# Patient Record
Sex: Male | Born: 1960
Health system: Southern US, Community
[De-identification: ages and names within clinical notes are randomized; demographics above are authoritative.]

## PROBLEM LIST (undated history)

## (undated) DIAGNOSIS — I10 Essential (primary) hypertension: Secondary | ICD-10-CM

## (undated) DIAGNOSIS — E785 Hyperlipidemia, unspecified: Secondary | ICD-10-CM

## (undated) DIAGNOSIS — I429 Cardiomyopathy, unspecified: Secondary | ICD-10-CM

## (undated) DIAGNOSIS — I4891 Unspecified atrial fibrillation: Secondary | ICD-10-CM

## (undated) DIAGNOSIS — R002 Palpitations: Secondary | ICD-10-CM

## (undated) DIAGNOSIS — R0600 Dyspnea, unspecified: Secondary | ICD-10-CM

## (undated) HISTORY — DX: Hyperlipidemia, unspecified: E78.5

## (undated) HISTORY — DX: Palpitations: R00.2

## (undated) HISTORY — PX: EYE SURGERY: SHX253

---

## 2005-04-16 ENCOUNTER — Emergency Department (HOSPITAL_COMMUNITY): Admission: EM | Admit: 2005-04-16 | Discharge: 2005-04-16 | Payer: Self-pay | Admitting: Emergency Medicine

## 2009-01-25 ENCOUNTER — Emergency Department (HOSPITAL_COMMUNITY): Admission: EM | Admit: 2009-01-25 | Discharge: 2009-01-25 | Payer: Self-pay | Admitting: Emergency Medicine

## 2010-07-02 LAB — POCT I-STAT, CHEM 8
BUN: 13 mg/dL (ref 6–23)
Calcium, Ion: 1.03 mmol/L — ABNORMAL LOW (ref 1.12–1.32)
Chloride: 104 mEq/L (ref 96–112)
Creatinine, Ser: 1.4 mg/dL (ref 0.4–1.5)
Glucose, Bld: 123 mg/dL — ABNORMAL HIGH (ref 70–99)
HCT: 44 % (ref 39.0–52.0)
Hemoglobin: 15 g/dL (ref 13.0–17.0)
Potassium: 3.8 mEq/L (ref 3.5–5.1)
Sodium: 140 mEq/L (ref 135–145)
TCO2: 26 mmol/L (ref 0–100)

## 2013-06-17 ENCOUNTER — Emergency Department (HOSPITAL_COMMUNITY)
Admission: EM | Admit: 2013-06-17 | Discharge: 2013-06-17 | Disposition: A | Discharge status: Home / Self Care | Payer: BC Managed Care – PPO | Attending: Emergency Medicine | Admitting: Emergency Medicine

## 2013-06-17 ENCOUNTER — Encounter (HOSPITAL_COMMUNITY): Payer: Self-pay | Admitting: Emergency Medicine

## 2013-06-17 DIAGNOSIS — K0889 Other specified disorders of teeth and supporting structures: Secondary | ICD-10-CM

## 2013-06-17 DIAGNOSIS — K029 Dental caries, unspecified: Secondary | ICD-10-CM | POA: Insufficient documentation

## 2013-06-17 DIAGNOSIS — K089 Disorder of teeth and supporting structures, unspecified: Secondary | ICD-10-CM | POA: Insufficient documentation

## 2013-06-17 MED ORDER — OXYCODONE-ACETAMINOPHEN 5-325 MG PO TABS
2.0000 | ORAL_TABLET | Freq: Four times a day (QID) | ORAL | Status: DC | PRN
Start: 2013-06-17 — End: 2014-07-30

## 2013-06-17 MED ORDER — ONDANSETRON 4 MG PO TBDP
8.0000 mg | ORAL_TABLET | Freq: Once | ORAL | Status: AC
  Administered 2013-06-17: 8 mg via ORAL
  Filled 2013-06-17: qty 2

## 2013-06-17 MED ORDER — OXYCODONE-ACETAMINOPHEN 5-325 MG PO TABS
2.0000 | ORAL_TABLET | Freq: Once | ORAL | Status: AC
  Administered 2013-06-17: 2 via ORAL
  Filled 2013-06-17: qty 2

## 2013-06-17 MED ORDER — PENICILLIN V POTASSIUM 500 MG PO TABS: 500.0000 mg | ORAL_TABLET | Freq: Four times a day (QID) | ORAL | Status: DC

## 2013-06-17 NOTE — ED Notes (Signed)
Toothache for one week

## 2013-06-17 NOTE — Discharge Instructions (Signed)
Dental Pain Toothache is pain in or around a tooth. It may get worse with chewing or with cold or heat.  HOME CARE  Your dentist may use a numbing medicine during treatment. If so, you may need to avoid eating until the medicine wears off. Ask your dentist about this.  Only take medicine as told by your dentist or doctor.  Avoid chewing food near the painful tooth until after all treatment is done. Ask your dentist about this. GET HELP RIGHT AWAY IF:   The problem gets worse or new problems appear.  You have a fever.  There is redness and puffiness (swelling) of the face, jaw, or neck.  You cannot open your mouth.  There is pain in the jaw.  There is very bad pain that is not helped by medicine. MAKE SURE YOU:   Understand these instructions.  Will watch your condition.  Will get help right away if you are not doing well or get worse. Document Released: 09/01/2007 Document Revised: 06/07/2011 Document Reviewed: 09/01/2007 [facility] Patient Information 2014 [place], [place].   Emergency Department Resource Guide 1) Find a Doctor and Pay Out of Pocket Although you won't have to find out who is covered by your insurance plan, it is a good idea to ask around and get recommendations. You will then need to call the office and see if the doctor you have chosen will accept you as a new patient and what types of options they offer for patients who are self-pay. Some doctors offer discounts or will set up payment plans for their patients who do not have insurance, but you will need to ask so you aren't surprised when you get to your appointment.  2) Contact Your Local Health Department Not all health departments have doctors that can see patients for sick visits, but many do, so it is worth a call to see if yours does. If you don't know where your local health department is, you can check in your phone book. The CDC also has a tool to help you locate your state's health department, and many  state websites also have listings of all of their local health departments.  3) Find a [facility] If your illness is not likely to be very severe or complicated, you may want to try a walk in clinic. These are popping up all over the country in pharmacies, drugstores, and shopping centers. They're usually staffed by nurse practitioners or physician assistants that have been trained to treat common illnesses and complaints. They're usually fairly quick and inexpensive. However, if you have serious medical issues or chronic medical problems, these are probably not your best option.  No Primary Care Doctor: - Call Health Connect at  [phone] - they can help you locate a primary care doctor that  accepts your insurance, provides certain services, etc. - Physician Referral Service- [phone]  Chronic Pain Problems: Organization         Address  Phone   Notes  [facility]  [phone] Patients need to be referred by their primary care doctor.   Medication Assistance: Organization         Address  Phone   Notes  [facility] [address] [place],  [address] [phone] --Must be a resident of [facility] -- Must have NO insurance coverage whatsoever (no Medicaid/ Medicare, etc.) -- The pt. MUST have a primary care doctor that directs their care regularly and follows  them in the community   [facility]  [phone]   [employer]  [phone]    Agencies that provide inexpensive medical care: Organization         Address  Phone   Notes  [place]  [phone]   [facility]    [phone]   [facility] [place], [place] [address] [phone]   [place] [address], [place] [phone]   Planned Parenthood    [phone]   [facility]    [phone]   [place] and  [address], [place] Phone:  [phone], Fax:  [phone] Hours of Operation:  9 am - 6 pm, M-F.  Also accepts Medicaid/Medicare and self-pay.  [facility] for [address], [address], [place] Phone: [phone], Fax: [phone]. Hours of Operation:  8:30 am - 5:30 pm, M-F.  Also accepts Medicaid and self-pay.  [facility] [place] [address] [place] Phone: [phone]   [place], [place], [place] [phone], Ext. [phone] Mondays & Thursdays: 7-9 AM.  First 15 patients are seen on a first come, first serve basis.    [place] Providers:  Organization         Address  Phone   Notes  [facility] [address], [place] [phone] Also accepts self-pay patients.  [facility] [address], [place]  [phone]   [place], [address], [place] [phone]   [facility] [address], [place] [phone]   Lucianne Lei [address], [place]   [phone] Only accepts [place] [facility] [place] patients after they have their name applied to their card.   Self-Pay (no insurance) in [facility]:  Organization         Address  Phone   Notes  Sickle Cell Patients, [facility] [place] [phone]   [facility] [place] [phone]   [facility] [place]  [place], [place], [place] [phone]   [facility]/Dr. Osei-Bonsu  [address] [place] [address], [place] or [address], [place] [phone] Phone number for both Institute and [place] locations is the same.  [facility] [address], [place] [phone]   [facility] [address] [place] or [address] [phone]   [facility] [address], [place] [phone], phone; [phone], fax Sees patients 1st and 3rd Saturday of every month.  Must not qualify for public or private insurance (i.e. Medicaid, Medicare, [place] Health Choice, Veterans' Benefits)  Household income should be no more than 200% of the poverty level The clinic cannot treat you if you are pregnant or think you are pregnant  Sexually transmitted diseases are not treated at the clinic.    Dental Care: Organization         Address  Phone  Notes  [facility] [address] [place], [place] [phone] Accepts children up to age 15 who are enrolled  in Medicaid or [place] Health Choice; pregnant women with a Medicaid card; and children who have applied for Medicaid or [place] Health Choice, but were declined, whose parents can pay a reduced fee at time of service.  °[facility] [place]  [address], [place] [phone] Accepts children up to age 21 who are enrolled in Medicaid or [place] Health Choice; pregnant women with a Medicaid card; and children who have applied for Medicaid or [place] Health Choice, but were declined, whose parents can pay a reduced fee at time of service.  °[facility] ° [address], [place] [phone] Patients are seen by appointment only. Walk-ins are not accepted. [facility] will see patients 18 years of age and older. °Monday - Tuesday (8am-5pm) °Most Wednesdays (8:30-5pm) °$30 per visit, cash only  °[facility] ° [address], [place] [phone] Patients are seen by appointment only. Walk-ins are not accepted. [facility] will see patients 18 years of age and older. °One Wednesday Evening (Monthly: Volunteer Based).  $30 per visit, cash only  °[facility]  [phone] for adults; Children under age 4, call Graduate Pediatric Dentistry at [phone]. Children aged 4-14, please call [phone] to request a pediatric application. ° Dental services are provided in all areas of dental care including fillings, crowns and bridges, complete and partial dentures, implants, gum treatment, root canals, and extractions. Preventive care is also provided. Treatment is provided to both adults and children. °Patients are selected via a lottery and there is often a waiting list. °  °[facility] [address], °[place] ° ([phone] [email or web address].[email or web address] °  °[facility] [address], [place], [place] ([phone], Ext. [phone] Second and Fourth Thursday of each month, opens at 6:30 AM; Clinic ends at 9 AM.  Patients are seen on a first-come first-served basis, and a limited number are seen during each clinic.  ° °[facility] ° [address], [place], [place] [phone]   Eligibility Requirements °You must have lived in [place], [place], or [place] counties for at least the last three months. °  You cannot be eligible for state or federal sponsored healthcare insurance, including Veterans Administration, Medicaid, or Medicare. °  You generally cannot be eligible for healthcare insurance through your employer.  °  How to apply: °Eligibility screenings are held every Tuesday and Wednesday afternoon from 1:00 pm until 4:00 pm. You do not need an appointment for the interview!  °[facility] [address], [place], [place] [phone]   °[facility]  [phone]   °[facility]  [phone]   °[facility]  [phone]   ° °Behavioral Health Resources in the Community: °Intensive Outpatient Programs °Organization         Address  Phone  Notes  °[facility] [address], [place], [place]  [phone]   °[facility] [address], [place], [place] [phone]   °[facility] [address], [place], [place] ° [phone]   °[facility] [address],  °[place], [place] 1-800-853-5163 or [phone]   °Substance Abuse Resources °Organization         Address  Phone  Notes  °[facility]  336-882-2125   °[facility]  [phone]   °  [facility]   Chinita Pester  ([phone])[phone]   Residential & Outpatient Substance Abuse Program  ([phone]   Psychological Services Organization         Address  Phone  Notes  [facility] [place]  [place]  [phone]   [facility] [address] [place] or [phone]    Mobile Crisis Teams Organization         Address  Phone  Notes  [facility], Mobile Crisis Care Unit  [phone]   [facility]  [address] [place], [place]   [place] [address], [place] [phone]    Self-Help/Support Groups Organization         Address  Phone             Notes  [place] [place] - variety of support groups  [place] Call for more information  Narcotics Anonymous (NA), [facility] [address], [employer] [place]  2 meetings at this location   Special educational needs teacher         Address  Phone  Notes  [facility] [place],    [place]  [phone]   [facility]  [address], [place] [address], [place], [place]   [place], [place] [phone] Admissions: 8am-3pm M-F  Incentives Substance [place] [address],    [place], [place] [phone]   [facility] [address] [place], [place], [place]   [facility] [address],    [place], [place]   [facility] - Intensive Outpatient [address], [place], [place]   [facility] ([facility] [place].,  [place], [place] [phone] or [phone]   [facility] ([facility]) [address], [place], [place] Accepts Medicaid  [facility] [place] [address]  [place] 1-646-085-7975 Substance Abuse/Addiction Treatment   [facility] Organization         Address  Phone  Notes  [facility]  [phone]   Domenic Schwab, PhD [address] Arlis Porta [place], [place]   [phone] or [phone]   [facility] [place], [place] [phone]   [facility] [address] [place], [place] [phone] Insurance/Medicaid/sponsorship through [facility] and Families [address] [place]                                    [place], [place] [phone] [place] [address], [place] [phone]    Dr. Adele Schilder  [phone]   [facility] Dept. 1) [address] [place] 2) [place] 3)  [place] [address], [place] [phone]  [phone]   [place] [phone] or [phone] (After Hours)

## 2013-06-17 NOTE — ED Provider Notes (Signed)
Medical screening examination/treatment/procedure(s) were performed by non-physician practitioner and as supervising physician I was immediately available for consultation/collaboration.    Johnna Acosta, MD 06/17/13 [phone]

## 2013-06-17 NOTE — ED Provider Notes (Signed)
CSN: [id number]     Arrival date & time 06/17/13  0014 History   First MD Initiated Contact with Patient 06/17/13 0040     Chief Complaint  Patient presents with  . Dental Pain     (Consider location/radiation/quality/duration/timing/severity/associated sxs/prior Treatment) HPI Comments: Patient is an otherwise healthy 53 year old male who presents to the ED for dental pain. The pain has been gradually worsening over the past week, but suddenly worsened tonight. It is in his right lower molar. He reports it is a throbbing, achy pain that is sometimes sharp. It is better with cold water. He denies fevers, chills, nausea, vomiting, shortness of breath.   Patient is a 53 y.o. male presenting with tooth pain. The history is provided by the patient. No language interpreter was used.  Dental Pain Associated symptoms: no fever     History reviewed. No pertinent past medical history. History reviewed. No pertinent past surgical history. No family history on file. History  Substance Use Topics  . Smoking status: Never Smoker   . Smokeless tobacco: Not on file  . Alcohol Use: No    Review of Systems  Constitutional: Negative for fever and chills.  HENT: Positive for dental problem.   Respiratory: Negative for shortness of breath.   Cardiovascular: Negative for chest pain.  Gastrointestinal: Negative for nausea, vomiting and abdominal pain.  All other systems reviewed and are negative.      Allergies  Review of patient's allergies indicates no known allergies.  Home Medications   Current Outpatient Rx  Name  Route  Sig  Dispense  Refill  . oxyCODONE-acetaminophen (PERCOCET/ROXICET) 5-325 MG per tablet   Oral   Take 2 tablets by mouth every 6 (six) hours as needed for severe pain.   20 tablet   0   . penicillin v potassium (VEETID) 500 MG tablet   Oral   Take 1 tablet (500 mg total) by mouth 4 (four) times daily.   40 tablet   0    BP 132/88  Pulse 87  Temp(Src) 98.4  F (36.9 C) (Oral)  Resp 18  Ht 6' 3.5" (1.918 m)  Wt 226 lb (102.513 kg)  BMI 27.87 kg/m2  SpO2 98% Physical Exam  Nursing note and vitals reviewed. Constitutional: He is oriented to person, place, and time. He appears well-developed and well-nourished.  Non-toxic appearance. He does not have a sickly appearance. He does not appear ill. No distress.  HENT:  Head: Normocephalic and atraumatic.  Right Ear: External ear normal.  Left Ear: External ear normal.  Nose: Nose normal.  Mouth/Throat: Uvula is midline and oropharynx is clear and moist. No trismus in the jaw. Abnormal dentition. No uvula swelling.  Generally poor dentition with dental caries scattered through mouth. Broken tooth in back left molar. TTP over posterior right molar. No trismus, submental edema, or tongue elevation.   Eyes: Conjunctivae are normal.  Neck: Normal range of motion. No tracheal deviation present.  Cardiovascular: Normal rate, regular rhythm and normal heart sounds.   Pulmonary/Chest: Effort normal and breath sounds normal. No stridor.  Abdominal: Soft. He exhibits no distension. There is no tenderness.  Musculoskeletal: Normal range of motion.  Neurological: He is alert and oriented to person, place, and time.  Skin: Skin is warm and dry. He is not diaphoretic.  Psychiatric: He has a normal mood and affect. His behavior is normal.    ED Course  Procedures (including critical care time) Labs Review Labs Reviewed - No data  to display Imaging Review No results found.   EKG Interpretation None      MDM   Final diagnoses:  Pain, dental   Patient with toothache.  No gross abscess.  Exam unconcerning for Ludwig's angina or spread of infection.  Will treat with penicillin and pain medicine.  Urged patient to follow-up with dentist.    Elwyn Lade, [name] 06/17/13 0301

## 2014-07-06 ENCOUNTER — Encounter (HOSPITAL_COMMUNITY): Payer: Self-pay | Admitting: Family Medicine

## 2014-07-06 ENCOUNTER — Emergency Department (HOSPITAL_COMMUNITY)
Admission: EM | Admit: 2014-07-06 | Discharge: 2014-07-06 | Disposition: A | Discharge status: Home / Self Care | Payer: BLUE CROSS/BLUE SHIELD | Attending: Emergency Medicine | Admitting: Emergency Medicine

## 2014-07-06 DIAGNOSIS — M545 Low back pain, unspecified: Secondary | ICD-10-CM

## 2014-07-06 DIAGNOSIS — Z792 Long term (current) use of antibiotics: Secondary | ICD-10-CM | POA: Diagnosis not present

## 2014-07-06 MED ORDER — METHOCARBAMOL 500 MG PO TABS: 500.0000 mg | ORAL_TABLET | Freq: Two times a day (BID) | ORAL | Status: DC | PRN

## 2014-07-06 MED ORDER — NAPROXEN 500 MG PO TABS: 500.0000 mg | ORAL_TABLET | Freq: Two times a day (BID) | ORAL | Status: DC

## 2014-07-06 NOTE — ED Provider Notes (Signed)
CSN: [id number]     Arrival date & time 07/06/14  8144 History  This chart was scribed for non-physician practitioner, Will Porfirio Mylar, [name] working with No att. providers found by Judithann Sauger, ED Scribe. The patient was seen in room TR07C/TR07C and the patient's care was started at 8:29 PM    Chief Complaint  Patient presents with  . Back Pain   The history is provided by the patient. No language interpreter was used.   HPI Comments: Carlos Harrison is a 54 y.o. male who presents to the Emergency Department complaining of a constant 7/10 right back pain that radiates down the top on his right thigh onset 5 days ago after he lifted something heavy at work. He reports that he does a lot of bending at work. He denies numbness and tingling. He states that sitting makes the pain worse but once he starts moving, the pain is a little better. He also denies any fever, chills, dysuria, hematuria, loss of bladder control, or incontinence. He denies abdominal pain, N/V/D, lightheadedness, dizziness, sore throat or congestion. He reports taking 4 Tylenol this am and 3 Advil yesterday with no relief. He reports that this back pain is different from any of his previous back symptoms as this time it is radiating. He denies a hx of cancer or IV drug use. He denies a hx of abdominal or kidney problems.    History reviewed. No pertinent past medical history. History reviewed. No pertinent past surgical history. History reviewed. No pertinent family history. History  Substance Use Topics  . Smoking status: Never Smoker   . Smokeless tobacco: Not on file  . Alcohol Use: No    Review of Systems  Constitutional: Negative for fever and chills.  HENT: Negative for congestion and sore throat.   Respiratory: Negative for shortness of breath.   Cardiovascular: Negative for leg swelling.  Gastrointestinal: Negative for nausea, vomiting, abdominal pain and diarrhea.  Genitourinary: Negative for dysuria, urgency,  hematuria, flank pain, decreased urine volume, penile swelling, scrotal swelling, difficulty urinating, penile pain and testicular pain.  Musculoskeletal: Positive for back pain. Negative for myalgias, joint swelling, gait problem and neck pain.  Skin: Negative for rash.  Neurological: Negative for dizziness, syncope, weakness, light-headedness, numbness and headaches.      Allergies  Review of patient's allergies indicates no known allergies.  Home Medications   Prior to Admission medications   Medication Sig Start Date End Date Taking? Authorizing Provider  methocarbamol (ROBAXIN) 500 MG tablet Take 1 tablet (500 mg total) by mouth 2 (two) times daily as needed for muscle spasms. 07/06/14   Waynetta Pean, [name]  naproxen (NAPROSYN) 500 MG tablet Take 1 tablet (500 mg total) by mouth 2 (two) times daily with a meal. 07/06/14   Waynetta Pean, [name]  oxyCODONE-acetaminophen (PERCOCET/ROXICET) 5-325 MG per tablet Take 2 tablets by mouth every 6 (six) hours as needed for severe pain. 06/17/13   Cleatrice Burke, [name]  penicillin v potassium (VEETID) 500 MG tablet Take 1 tablet (500 mg total) by mouth 4 (four) times daily. 06/17/13   Cleatrice Burke, [name]   BP 156/94 mmHg  Pulse 71  Temp(Src) 98.6 F (37 C) (Oral)  Resp 16  SpO2 100% Physical Exam  Constitutional: He is oriented to person, place, and time. He appears well-developed and well-nourished. No distress.  Nontoxic appearing.  HENT:  Head: Normocephalic and atraumatic.  Right Ear: External ear normal.  Left Ear: External ear normal.  Mouth/Throat: No oropharyngeal exudate.  Eyes: Conjunctivae and EOM are normal. Pupils are equal, round, and reactive to light. Right eye exhibits no discharge. Left eye exhibits no discharge.  Neck: Neck supple. No tracheal deviation present.  Cardiovascular: Normal rate, regular rhythm, normal heart sounds and intact distal pulses.   Bilateral radial and PT pulses intact.  Pulmonary/Chest: Effort  normal and breath sounds normal. No respiratory distress. He has no wheezes. He has no rales.  Abdominal: Soft. There is no tenderness.  Musculoskeletal: Normal range of motion. He exhibits tenderness. He exhibits no edema.  Mild right lateral low back tenderness. No midline back tenderness, no edema, no deformities, no crepitus, no step-off, no erythema.  No lower extremity edema or tenderness.  Patient is spontaneously moving all extremities in a coordinated fashion exhibiting good strength. The patient is able to ambulate without difficulty or assistance.    Lymphadenopathy:    He has no cervical adenopathy.  Neurological: He is alert and oriented to person, place, and time. He has normal reflexes. He displays normal reflexes. Coordination normal.  Bilateral patellar DTRs intact. Sensation is intact in his bilateral upper and lower extremities.  Skin: Skin is warm and dry. No rash noted. He is not diaphoretic. No erythema. No pallor.  Psychiatric: He has a normal mood and affect. His behavior is normal.  Nursing note and vitals reviewed.   ED Course  Procedures (including critical care time) DIAGNOSTIC STUDIES: Oxygen Saturation is 100% on RA, normal by my interpretation.    COORDINATION OF CARE: 8:45 PM- Pt advised of plan for treatment and pt agrees.    Labs Review Labs Reviewed - No data to display  Imaging Review No results found.   EKG Interpretation None      Filed Vitals:   07/06/14 1826 07/06/14 2050  BP: 147/84 156/94  Pulse: 66 71  Temp: 98.6 F (37 C)   TempSrc: Oral Oral  Resp: 15 16  SpO2: 100% 100%     MDM   Meds given in ED:  Medications - No data to display  Discharge Medication List as of 07/06/2014  8:47 PM    START taking these medications   Details  methocarbamol (ROBAXIN) 500 MG tablet Take 1 tablet (500 mg total) by mouth 2 (two) times daily as needed for muscle spasms., Starting 07/06/2014, Until Discontinued, Print    naproxen  (NAPROSYN) 500 MG tablet Take 1 tablet (500 mg total) by mouth 2 (two) times daily with a meal., Starting 07/06/2014, Until Discontinued, Print        Final diagnoses:  Right-sided low back pain without sciatica   This is a 54 year old male who is otherwise healthy presents to the emergency department complaining of right lower back pain radiates down his anterior thigh after lifting a heavy object at work 5 days ago. He reports his pain is worse with movement and sitting for long periods. He also reports that once he gets up and starts walking his pain improves somewhat. He currently rates his pain at 7 out of 10. Denies any numbness, tingling or weakness.  No neurological deficits and normal neuro exam.  Patient can walk but states is painful.  No loss of bowel or bladder control.  No concern for cauda equina.  No fever, night sweats, weight loss, h/o cancer, IVDU.  RICE protocol and pain medicine indicated and discussed with patient. I advised the patient to follow-up with their primary care provider this week. I advised the patient to return to the emergency department with  new or worsening symptoms or new concerns. The patient verbalized understanding and agreement with plan.   I personally performed the services described in this documentation, which was scribed in my presence. The recorded information has been reviewed and is accurate.    Waynetta Pean, [name] 07/06/14 2058  Pamella Pert, MD 07/07/14 [phone]

## 2014-07-06 NOTE — Discharge Instructions (Signed)
Back Pain, Adult °Low back pain is very common. About 1 in 5 people have back pain. The cause of low back pain is rarely dangerous. The pain often gets better over time. About half of people with a sudden onset of back pain feel better in just 2 weeks. About 8 in 10 people feel better by 6 weeks.  °CAUSES °Some common causes of back pain include: °· Strain of the muscles or ligaments supporting the spine. °· Wear and tear (degeneration) of the spinal discs. °· Arthritis. °· Direct injury to the back. °DIAGNOSIS °Most of the time, the direct cause of low back pain is not known. However, back pain can be treated effectively even when the exact cause of the pain is unknown. Answering your caregiver's questions about your overall health and symptoms is one of the most accurate ways to make sure the cause of your pain is not dangerous. If your caregiver needs more information, he or she may order lab work or imaging tests (X-rays or MRIs). However, even if imaging tests show changes in your back, this usually does not require surgery. °HOME CARE INSTRUCTIONS °For many people, back pain returns. Since low back pain is rarely dangerous, it is often a condition that people can learn to manage on their own.  °· Remain active. It is stressful on the back to sit or stand in one place. Do not sit, drive, or stand in one place for more than 30 minutes at a time. Take short walks on level surfaces as soon as pain allows. Try to increase the length of time you walk each day. °· Do not stay in bed. Resting more than 1 or 2 days can delay your recovery. °· Do not avoid exercise or work. Your body is made to move. It is not dangerous to be active, even though your back may hurt. Your back will likely heal faster if you return to being active before your pain is gone. °· Pay attention to your body when you  bend and lift. Many people have less discomfort when lifting if they bend their knees, keep the load close to their bodies, and  avoid twisting. Often, the most comfortable positions are those that put less stress on your recovering back. °· Find a comfortable position to sleep. Use a firm mattress and lie on your side with your knees slightly bent. If you lie on your back, put a pillow under your knees. °· Only take over-the-counter or prescription medicines as directed by your caregiver. Over-the-counter medicines to reduce pain and inflammation are often the most helpful. Your caregiver may prescribe muscle relaxant drugs. These medicines help dull your pain so you can more quickly return to your normal activities and healthy exercise. °· Put ice on the injured area. °· Put ice in a plastic bag. °· Place a towel between your skin and the bag. °· Leave the ice on for 15-20 minutes, 03-04 times a day for the first 2 to 3 days. After that, ice and heat may be alternated to reduce pain and spasms. °· Ask your caregiver about trying back exercises and gentle massage. This may be of some benefit. °· Avoid feeling anxious or stressed. Stress increases muscle tension and can worsen back pain. It is important to recognize when you are anxious or stressed and learn ways to manage it. Exercise is a great option. °SEEK MEDICAL CARE IF: °· You have pain that is not relieved with rest or medicine. °· You have pain that does not improve in 1 week. °· You have new symptoms. °· You are generally not feeling well. °SEEK   IMMEDIATE MEDICAL CARE IF:   You have pain that radiates from your back into your legs.  You develop new bowel or bladder control problems.  You have unusual weakness or numbness in your arms or legs.  You develop nausea or vomiting.  You develop abdominal pain.  You feel faint. Document Released: 03/15/2005 Document Revised: 09/14/2011 Document Reviewed: 07/17/2013 [facility] Patient Information 2015 [place], [place]. This information is not intended to replace advice given to you by your health care provider. Make sure you  discuss any questions you have with your health care provider.  Back Exercises Back exercises help treat and prevent back injuries. The goal of back exercises is to increase the strength of your abdominal and back muscles and the flexibility of your back. These exercises should be started when you no longer have back pain. Back exercises include:  Pelvic Tilt. Lie on your back with your knees bent. Tilt your pelvis until the lower part of your back is against the floor. Hold this position 5 to 10 sec and repeat 5 to 10 times.  Knee to Chest. Pull first 1 knee up against your chest and hold for 20 to 30 seconds, repeat this with the other knee, and then both knees. This may be done with the other leg straight or bent, whichever feels better.  Sit-Ups or Curl-Ups. Bend your knees 90 degrees. Start with tilting your pelvis, and do a partial, slow sit-up, lifting your trunk only 30 to 45 degrees off the floor. Take at least 2 to 3 seconds for each sit-up. Do not do sit-ups with your knees out straight. If partial sit-ups are difficult, simply do the above but with only tightening your abdominal muscles and holding it as directed.  Hip-Lift. Lie on your back with your knees flexed 90 degrees. Push down with your feet and shoulders as you raise your hips a couple inches off the floor; hold for 10 seconds, repeat 5 to 10 times.  Back arches. Lie on your stomach, propping yourself up on bent elbows. Slowly press on your hands, causing an arch in your low back. Repeat 3 to 5 times. Any initial stiffness and discomfort should lessen with repetition over time.  Shoulder-Lifts. Lie face down with arms beside your body. Keep hips and torso pressed to floor as you slowly lift your head and shoulders off the floor. Do not overdo your exercises, especially in the beginning. Exercises may cause you some mild back discomfort which lasts for a few minutes; however, if the pain is more severe, or lasts for more than 15  minutes, do not continue exercises until you see your caregiver. Improvement with exercise therapy for back problems is slow.  See your caregivers for assistance with developing a proper back exercise program. Document Released: 04/22/2004 Document Revised: 06/07/2011 Document Reviewed: 01/14/2011 [facility] Patient Information 2015 [place], [place]. This information is not intended to replace advice given to you by your health care provider. Make sure you discuss any questions you have with your health care provider.  Back Injury Prevention Back injuries can be extremely painful and difficult to heal. After having one back injury, you are much more likely to experience another later on. It is important to learn how to avoid injuring or re-injuring your back. The following tips can help you to prevent a back injury. PHYSICAL FITNESS  Exercise regularly and try to develop good tone in your abdominal muscles. Your abdominal muscles provide a lot of the support needed by your back.  Do aerobic exercises (walking, jogging, biking, swimming) regularly.  Do exercises that increase balance and strength (tai chi, yoga) regularly. This can decrease your risk of falling and injuring your back.  Stretch before and after exercising.  Maintain a healthy weight. The more you weigh, the more stress is placed on your back. For every pound of weight, 10 times that amount of pressure is placed on the back. DIET  Talk to your caregiver about how much calcium and vitamin D you need per day. These nutrients help to prevent weakening of the bones (osteoporosis). Osteoporosis can cause broken (fractured) bones that lead to back pain.  Include good sources of calcium in your diet, such as dairy products, green, leafy vegetables, and products with calcium added (fortified).  Include good sources of vitamin D in your diet, such as milk and foods that are fortified with vitamin D.  Consider taking a nutritional  supplement or a multivitamin if needed.  Stop smoking if you smoke. POSTURE  Sit and stand up straight. Avoid leaning forward when you sit or hunching over when you stand.  Choose chairs with good low back (lumbar) support.  If you work at a desk, sit close to your work so you do not need to lean over. Keep your chin tucked in. Keep your neck drawn back and elbows bent at a right angle. Your arms should look like the letter "L."  Sit high and close to the steering wheel when you drive. Add a lumbar support to your car seat if needed.  Avoid sitting or standing in one position for too long. Take breaks to get up, stretch, and walk around at least once every hour. Take breaks if you are driving for long periods of time.  Sleep on your side with your knees slightly bent, or sleep on your back with a pillow under your knees. Do not sleep on your stomach. LIFTING, TWISTING, AND REACHING  Avoid heavy lifting, especially repetitive lifting. If you must do heavy lifting:  Stretch before lifting.  Work slowly.  Rest between lifts.  Use carts and dollies to move objects when possible.  Make several small trips instead of carrying 1 heavy load.  Ask for help when you need it.  Ask for help when moving big, awkward objects.  Follow these steps when lifting:  Stand with your feet shoulder-width apart.  Get as close to the object as you can. Do not try to pick up heavy objects that are far from your body.  Use handles or lifting straps if they are available.  Bend at your knees. Squat down, but keep your heels off the floor.  Keep your shoulders pulled back, your chin tucked in, and your back straight.  Lift the object slowly, tightening the muscles in your legs, abdomen, and buttocks. Keep the object as close to the center of your body as possible.  When you put a load down, use these same guidelines in reverse.  Do not:  Lift the object above your waist.  Twist at the waist  while lifting or carrying a load. Move your feet if you need to turn, not your waist.  Bend over without bending at your knees.  Avoid reaching over your head, across a table, or for an object on a high surface. OTHER TIPS  Avoid wet floors and keep sidewalks clear of ice to prevent falls.  Do not sleep on a mattress that is too soft or too hard.  Keep items that are  used frequently within easy reach.  Put heavier objects on shelves at waist level and lighter objects on lower or higher shelves.  Find ways to decrease your stress, such as exercise, massage, or relaxation techniques. Stress can build up in your muscles. Tense muscles are more vulnerable to injury.  Seek treatment for depression or anxiety if needed. These conditions can increase your risk of developing back pain. SEEK MEDICAL CARE IF:  You injure your back.  You have questions about diet, exercise, or other ways to prevent back injuries. MAKE SURE YOU:  Understand these instructions.  Will watch your condition.  Will get help right away if you are not doing well or get worse. Document Released: 04/22/2004 Document Revised: 06/07/2011 Document Reviewed: 04/26/2011 [facility] Patient Information 2015 [place], [place]. This information is not intended to replace advice given to you by your health care provider. Make sure you discuss any questions you have with your health care provider.

## 2014-07-06 NOTE — ED Notes (Signed)
Pt having right lower back pain radiating down right leg. sts started after pushing something at work.

## 2014-07-10 ENCOUNTER — Emergency Department (HOSPITAL_COMMUNITY)
Admission: EM | Admit: 2014-07-10 | Discharge: 2014-07-10 | Disposition: A | Discharge status: Home / Self Care | Payer: BLUE CROSS/BLUE SHIELD | Attending: Emergency Medicine | Admitting: Emergency Medicine

## 2014-07-10 ENCOUNTER — Encounter (HOSPITAL_COMMUNITY): Payer: Self-pay | Admitting: Physical Medicine and Rehabilitation

## 2014-07-10 DIAGNOSIS — M5417 Radiculopathy, lumbosacral region: Secondary | ICD-10-CM | POA: Diagnosis not present

## 2014-07-10 DIAGNOSIS — Z792 Long term (current) use of antibiotics: Secondary | ICD-10-CM | POA: Diagnosis not present

## 2014-07-10 DIAGNOSIS — M79604 Pain in right leg: Secondary | ICD-10-CM | POA: Diagnosis present

## 2014-07-10 MED ORDER — HYDROCODONE-ACETAMINOPHEN 5-325 MG PO TABS: 1.0000 | ORAL_TABLET | Freq: Four times a day (QID) | ORAL | Status: DC | PRN

## 2014-07-10 MED ORDER — HYDROCODONE-ACETAMINOPHEN 5-325 MG PO TABS
1.0000 | ORAL_TABLET | Freq: Once | ORAL | Status: AC
Start: 2014-07-10 — End: 2014-07-10
  Administered 2014-07-10: 1 via ORAL
  Filled 2014-07-10: qty 1

## 2014-07-10 MED ORDER — PREDNISONE 10 MG PO TABS: ORAL_TABLET | ORAL | Status: DC

## 2014-07-10 MED ORDER — PREDNISONE 20 MG PO TABS
60.0000 mg | ORAL_TABLET | Freq: Once | ORAL | Status: AC
  Administered 2014-07-10: 60 mg via ORAL
  Filled 2014-07-10: qty 3

## 2014-07-10 NOTE — ED Notes (Signed)
Pt is in stable condition upon d/c and ambulates from ED. 

## 2014-07-10 NOTE — ED Provider Notes (Signed)
CSN: [id number]     Arrival date & time 07/10/14  1700 History   This chart was scribed for non-physician practitioner, Renold Genta, [name], working with Lajean Saver, MD, by Stephania Fragmin, ED Scribe. This patient was seen in room TR06C/TR06C and the patient's care was started at 5:17 PM.    Chief Complaint  Patient presents with  . Leg Pain  . Hip Pain   The history is provided by the patient. No language interpreter was used.    HPI Comments: Carlos Harrison is a 54 y.o. male who presents to the Emergency Department complaining of pain from the top of his right buttock radiating into his right thigh after lifting something heavy at work, with onset about 1-1.5 weeks ago. He was seen here at the ED for the same 4 days ago, but the medication he was given (Naprosyn and Robaxin, per medical records) hasn't alleviated the pain. Elevating the leg alleviates that pain, but he is otherwise unable to find a comfortable position to sleep in at night.  Patient is able to ambulate for about 1 minute at a time, due to the pain. Patient has a follow-up doctor's appointment in May; he states he does not have a PCP. He denies a history of DM. He denies bowel or bladder incontinence or fever.  History reviewed. No pertinent past medical history. History reviewed. No pertinent past surgical history. History reviewed. No pertinent family history. History  Substance Use Topics  . Smoking status: Never Smoker   . Smokeless tobacco: Not on file  . Alcohol Use: No    Review of Systems  Constitutional: Negative for fever.  Genitourinary:       Negative for incontinence  Musculoskeletal: Positive for myalgias and back pain.  Neurological: Positive for numbness. Negative for weakness.      Allergies  Review of patient's allergies indicates no known allergies.  Home Medications   Prior to Admission medications   Medication Sig Start Date End Date Taking? Authorizing Provider  methocarbamol  (ROBAXIN) 500 MG tablet Take 1 tablet (500 mg total) by mouth 2 (two) times daily as needed for muscle spasms. 07/06/14   Waynetta Pean, [name]  naproxen (NAPROSYN) 500 MG tablet Take 1 tablet (500 mg total) by mouth 2 (two) times daily with a meal. 07/06/14   Waynetta Pean, [name]  oxyCODONE-acetaminophen (PERCOCET/ROXICET) 5-325 MG per tablet Take 2 tablets by mouth every 6 (six) hours as needed for severe pain. 06/17/13   Cleatrice Burke, [name]  penicillin v potassium (VEETID) 500 MG tablet Take 1 tablet (500 mg total) by mouth 4 (four) times daily. 06/17/13   Cleatrice Burke, [name]   BP 177/94 mmHg  Pulse 90  Temp(Src) 98.6 F (37 C) (Oral)  Resp 18  SpO2 100% Physical Exam  Constitutional: He is oriented to person, place, and time. He appears well-developed and well-nourished. No distress.  HENT:  Head: Normocephalic and atraumatic.  Eyes: Conjunctivae and EOM are normal.  Neck: Neck supple. No tracheal deviation present.  Cardiovascular: Normal rate.   Pulmonary/Chest: Effort normal. No respiratory distress.  Musculoskeletal: Normal range of motion.  No midline lumbar spine tenderness. Tender to palpation over right SI joint of right buttock. Mild pain with right straight leg raise. No pain with right hip internal or external rotation.  Neurological: He is alert and oriented to person, place, and time.  Mildly decreased sensation over her right medial thigh, otherwise normal. 5/5 and equal lower extremity strength. 2+ and equal patellar  reflexes bilaterally. Pt able to dorsiflex bilateral toes and feet with good strength against resistance. Equal sensation bilaterally over thighs and lower legs.   Skin: Skin is warm and dry.  Psychiatric: He has a normal mood and affect. His behavior is normal.  Nursing note and vitals reviewed.   ED Course  Procedures (including critical care time)  DIAGNOSTIC STUDIES: Oxygen Saturation is 100% on room air, normal by my interpretation.    COORDINATION  OF CARE: 5:22 PM - Discussed treatment plan with pt at bedside which includes Rx steroid medication, HEP, and referral to [facility] and pt agreed to plan.    MDM   Final diagnoses:  Lumbosacral radiculopathy    Patient with persistent lower back pain with radiation down right leg. No evidence of cauda equina, he denies any neurological symptoms other than some decrease in sensation in her right medial thigh. I do not think any imaging is indicated at this time, patient will be discharged home, will try a steroid pack, Norco for severe pain, follow-up with primary care doctor. Also have given him exercises for the back. Return precautions discussed  Filed Vitals:   07/10/14 1713  BP: 177/94  Pulse: 90  Temp: 98.6 F (37 C)  TempSrc: Oral  Resp: 18  SpO2: 100%   I personally performed the services described in this documentation, which was scribed in my presence. The recorded information has been reviewed and is accurate.    Jeannett Senior, [name] 07/10/14 1931  Lajean Saver, MD 07/10/14 2022

## 2014-07-10 NOTE — Discharge Instructions (Signed)
Continue naprosyn and robaxin. Take norco for severe pain only as prescribed. You can take 1-2 tabs every 4-6 hrs. take prednisone as prescribed until all gone, next dose tomorrow since she received one emergency department. Please follow-up with her primary care doctor of your choice for further treatment. Back exercises as described below.  Lumbosacral Radiculopathy Lumbosacral radiculopathy is a pinched nerve or nerves in the low back (lumbosacral area). When this happens you may have weakness in your legs and may not be able to stand on your toes. You may have pain going down into your legs. There may be difficulties with walking normally. There are many causes of this problem. Sometimes this may happen from an injury, or simply from arthritis or boney problems. It may also be caused by other illnesses such as diabetes. If there is no improvement after treatment, further studies may be done to find the exact cause. DIAGNOSIS  X-rays may be needed if the problems become long standing. Electromyograms may be done. This study is one in which the working of nerves and muscles is studied. HOME CARE INSTRUCTIONS   Applications of ice packs may be helpful. Ice can be used in a plastic bag with a towel around it to prevent frostbite to skin. This may be used every 2 hours for 20 to 30 minutes, or as needed, while awake, or as directed by your caregiver.  Only take over-the-counter or prescription medicines for pain, discomfort, or fever as directed by your caregiver.  If physical therapy was prescribed, follow your caregiver's directions. SEEK IMMEDIATE MEDICAL CARE IF:   You have pain not controlled with medications.  You seem to be getting worse rather than better.  You develop increasing weakness in your legs.  You develop loss of bowel or bladder control.  You have difficulty with walking or balance, or develop clumsiness in the use of your legs.  You have a fever. MAKE SURE YOU:    Understand these instructions.  Will watch your condition.  Will get help right away if you are not doing well or get worse. Document Released: 03/15/2005 Document Revised: 06/07/2011 Document Reviewed: 11/03/2007 [facility] Patient Information 2015 [place], [place]. This information is not intended to replace advice given to you by your health care provider. Make sure you discuss any questions you have with your health care provider.    Back Exercises These exercises may help you when beginning to rehabilitate your injury. Your symptoms may resolve with or without further involvement from your physician, physical therapist or athletic trainer. While completing these exercises, remember:   Restoring tissue flexibility helps normal motion to return to the joints. This allows healthier, less painful movement and activity.  An effective stretch should be held for at least 30 seconds.  A stretch should never be painful. You should only feel a gentle lengthening or release in the stretched tissue. STRETCH - Extension, Prone on Elbows   Lie on your stomach on the floor, a bed will be too soft. Place your palms about shoulder width apart and at the height of your head.  Place your elbows under your shoulders. If this is too painful, stack pillows under your chest.  Allow your body to relax so that your hips drop lower and make contact more completely with the floor.  Hold this position for __________ seconds.  Slowly return to lying flat on the floor. Repeat __________ times. Complete this exercise __________ times per day.  RANGE OF MOTION - Extension, Prone Press Ups  Lie on your stomach on the floor, a bed will be too soft. Place your palms about shoulder width apart and at the height of your head.  Keeping your back as relaxed as possible, slowly straighten your elbows while keeping your hips on the floor. You may adjust the placement of your hands to maximize your comfort. As you  gain motion, your hands will come more underneath your shoulders.  Hold this position __________ seconds.  Slowly return to lying flat on the floor. Repeat __________ times. Complete this exercise __________ times per day.  RANGE OF MOTION- Quadruped, Neutral Spine   Assume a hands and knees position on a firm surface. Keep your hands under your shoulders and your knees under your hips. You may place padding under your knees for comfort.  Drop your head and point your tail bone toward the ground below you. This will round out your low back like an angry cat. Hold this position for __________ seconds.  Slowly lift your head and release your tail bone so that your back sags into a large arch, like an old horse.  Hold this position for __________ seconds.  Repeat this until you feel limber in your low back.  Now, find your "sweet spot." This will be the most comfortable position somewhere between the two previous positions. This is your neutral spine. Once you have found this position, tense your stomach muscles to support your low back.  Hold this position for __________ seconds. Repeat __________ times. Complete this exercise __________ times per day.  STRETCH - Flexion, Single Knee to Chest   Lie on a firm bed or floor with both legs extended in front of you.  Keeping one leg in contact with the floor, bring your opposite knee to your chest. Hold your leg in place by either grabbing behind your thigh or at your knee.  Pull until you feel a gentle stretch in your low back. Hold __________ seconds.  Slowly release your grasp and repeat the exercise with the opposite side. Repeat __________ times. Complete this exercise __________ times per day.  STRETCH - Hamstrings, Standing  Stand or sit and extend your right / left leg, placing your foot on a chair or foot stool  Keeping a slight arch in your low back and your hips straight forward.  Lead with your chest and lean forward at the  waist until you feel a gentle stretch in the back of your right / left knee or thigh. (When done correctly, this exercise requires leaning only a small distance.)  Hold this position for __________ seconds. Repeat __________ times. Complete this stretch __________ times per day. STRENGTHENING - Deep Abdominals, Pelvic Tilt   Lie on a firm bed or floor. Keeping your legs in front of you, bend your knees so they are both pointed toward the ceiling and your feet are flat on the floor.  Tense your lower abdominal muscles to press your low back into the floor. This motion will rotate your pelvis so that your tail bone is scooping upwards rather than pointing at your feet or into the floor.  With a gentle tension and even breathing, hold this position for __________ seconds. Repeat __________ times. Complete this exercise __________ times per day.  STRENGTHENING - Abdominals, Crunches   Lie on a firm bed or floor. Keeping your legs in front of you, bend your knees so they are both pointed toward the ceiling and your feet are flat on the floor. Cross your arms  over your chest.  Slightly tip your chin down without bending your neck.  Tense your abdominals and slowly lift your trunk high enough to just clear your shoulder blades. Lifting higher can put excessive stress on the low back and does not further strengthen your abdominal muscles.  Control your return to the starting position. Repeat __________ times. Complete this exercise __________ times per day.  STRENGTHENING - Quadruped, Opposite UE/LE Lift   Assume a hands and knees position on a firm surface. Keep your hands under your shoulders and your knees under your hips. You may place padding under your knees for comfort.  Find your neutral spine and gently tense your abdominal muscles so that you can maintain this position. Your shoulders and hips should form a rectangle that is parallel with the floor and is not twisted.  Keeping your  trunk steady, lift your right hand no higher than your shoulder and then your left leg no higher than your hip. Make sure you are not holding your breath. Hold this position __________ seconds.  Continuing to keep your abdominal muscles tense and your back steady, slowly return to your starting position. Repeat with the opposite arm and leg. Repeat __________ times. Complete this exercise __________ times per day. Document Released: 04/02/2005 Document Revised: 06/07/2011 Document Reviewed: 06/27/2008 [facility] Patient Information 2015 [place], [place]. This information is not intended to replace advice given to you by your health care provider. Make sure you discuss any questions you have with your health care provider.

## 2014-07-10 NOTE — ED Notes (Signed)
Pt reports pain from R buttock area radiating down R leg. Ongoing for several days, was seen recently for same. 9/10 pain upon arrival to ED. Pt reports heavy lifting and bending at work recently.

## 2014-07-30 ENCOUNTER — Encounter: Payer: Self-pay | Admitting: Family

## 2014-07-30 ENCOUNTER — Ambulatory Visit (INDEPENDENT_AMBULATORY_CARE_PROVIDER_SITE_OTHER): Payer: BLUE CROSS/BLUE SHIELD | Admitting: Family

## 2014-07-30 VITALS — BP 140/94 | HR 92 | Temp 98.3°F | Resp 18 | Ht 72.0 in | Wt 232.0 lb

## 2014-07-30 DIAGNOSIS — M533 Sacrococcygeal disorders, not elsewhere classified: Secondary | ICD-10-CM

## 2014-07-30 MED ORDER — NAPROXEN 500 MG PO TABS: 500.0000 mg | ORAL_TABLET | Freq: Two times a day (BID) | ORAL | Status: DC

## 2014-07-30 NOTE — Progress Notes (Signed)
Pre visit review using our clinic review tool, if applicable. No additional management support is needed unless otherwise documented below in the visit note. 

## 2014-07-30 NOTE — Patient Instructions (Signed)
Thank you for choosing [facility].  Summary/Instructions:  Your prescription(s) have been submitted to your pharmacy or been printed and provided for you. Please take as directed and contact our office if you believe you are having problem(s) with the medication(s) or have any questions.  If your symptoms worsen or fail to improve, please contact our office for further instruction, or in case of emergency go directly to the emergency room at the closest medical facility.   Sacroiliac Joint Dysfunction The sacroiliac joint connects the lower part of the spine (the sacrum) with the bones of the pelvis. CAUSES  Sometimes, there is no obvious reason for sacroiliac joint dysfunction. Other times, it may occur   During pregnancy.  After injury, such as:  Car accidents.  Sport-related injuries.  Work-related injuries.  Due to one leg being shorter than the other.  Due to other conditions that affect the joints, such as:  Rheumatoid arthritis.  Gout.  Psoriasis.  Joint infection (septic arthritis). SYMPTOMS  Symptoms may include:  Pain in the:  Lower back.  Buttocks.  Groin.  Thighs and legs.  Difficult sitting, standing, walking, lying, bending or lifting. DIAGNOSIS  A number of tests may be used to help diagnose the cause of sacroiliac joint dysfunction, including:  Imaging tests to look for other causes of pain, including:  MRI.  CT scan.  Bone scan.  Diagnostic injection: During a special x-ray (called fluoroscopy), a needle is put into the sacroiliac joint. A numbing medicine is injected into the joint. If the pain is improved or stopped, the diagnosis of sacroiliac joint dysfunction is more likely. TREATMENT  There are a number of types of treatment used for sacroiliac joint dysfunction, including:  Only take over-the-counter or prescription medicines for pain, discomfort, or fever as directed by your caregiver.  Medications to relax  muscles.  Rest. Decreasing activity can help cut down on painful muscle spasms and allow the back to heal.  Application of heat or ice to the lower back may improve muscle spasms and soothe pain.  Brace. A special back brace, called a sacroiliac belt, can help support the joint while your back is healing.  Physical therapy can help teach comfortable positions and exercises to strengthen muscles that support the sacroiliac joint.  Cortisone injections. Injections of steroid medicine into the joint can help decrease swelling and improve pain.  Hyaluronic acid injections. This chemical improves lubrication within the sacroiliac joint, thereby decreasing pain.  Radiofrequency ablation. A special needle is placed into the joint, where it burns away nerves that are carrying pain messages from the joint.  Surgery. Because pain occurs during movement of the joint, screws and plates may be installed in order to limit or prevent joint motion. HOME CARE INSTRUCTIONS   Take all medications exactly as directed.  Follow instructions regarding both rest and physical activity, to avoid worsening the pain.  Do physical therapy exercises exactly as prescribed. SEEK IMMEDIATE MEDICAL CARE IF:  You experience increasingly severe pain.  You develop new symptoms, such as numbness or tingling in your legs or feet.  You lose bladder or bowel control. Document Released: 06/11/2008 Document Revised: 06/07/2011 Document Reviewed: 06/11/2008 [facility] Patient Information 2015 [place], [place]. This information is not intended to replace advice given to you by your health care provider. Make sure you discuss any questions you have with your health care provider.

## 2014-07-30 NOTE — Progress Notes (Signed)
   Subjective:    Patient ID: Carlos Harrison, male    DOB: [birth year], 54 y.o.   MRN: [id number]  Chief Complaint  Patient presents with  . Establish Care    says a couple weeks ago he had a pinched nerve, states that his right thigh gets numb and sensitive from time to time    HPI:  Carlos Harrison is a 54 y.o. male who presents today for an office visit to establish care. He is present today with his wife.  Associated symptom of pain and numbness located in his lower back and right leg started a couple of weeks ago when he was lifting something at work. Following the event he was walking which made the symptoms worse. He was seen in the ED on 2 separate occasions and diagnosed with a pinched nerve. He continues to experience some associated numbness in his right thigh since completing the medication regimen. Indicates that his back pain is significantly improved since the treatment with a current severity of 1-2/10. Functionally improved since completing the treatment without any complications. Denies any associated back pain currently. Denies any current pains. All ED records were reviewed in detail.   No Known Allergies  No current outpatient prescriptions on file prior to visit.   No current facility-administered medications on file prior to visit.    History reviewed. No pertinent past medical history.  Past Surgical History  Procedure Laterality Date  . Eye surgery Left     Family History  Problem Relation Age of Onset  . Ovarian cancer Mother     History   Social History  . Marital Status: Married    Spouse Name: N/A  . Number of Children: N/A  . Years of Education: N/A   Occupational History  . Not on file.   Social History Main Topics  . Smoking status: Never Smoker   . Smokeless tobacco: Never Used  . Alcohol Use: No  . Drug Use: No  . Sexual Activity: Not on file   Other Topics Concern  . Not on file   Social History Narrative    Review of  Systems  Musculoskeletal: Negative for back pain and arthralgias.  Neurological: Positive for numbness. Negative for weakness.      Objective:    BP 140/94 mmHg  Pulse 92  Temp(Src) 98.3 F (36.8 C) (Oral)  Resp 18  Ht 6' (1.829 m)  Wt 232 lb (105.235 kg)  BMI 31.46 kg/m2  SpO2 98% Nursing note and vital signs reviewed.  Physical Exam  Constitutional: He is oriented to person, place, and time. He appears well-developed and well-nourished. No distress.  Cardiovascular: Normal rate, regular rhythm, normal heart sounds and intact distal pulses.   Pulmonary/Chest: Effort normal and breath sounds normal.  Musculoskeletal:  No obvious deformity, discoloration, or edema of low back noted. Mild tenderness elicited over the right sacroiliac joint. Range of motion in lumbar spine is limited in flexion secondary to muscle tightness. All other motions are intact and appropriate. Distal pulses, sensation and reflexes are intact and appropriate. Straight leg raise is negative  Neurological: He is alert and oriented to person, place, and time.  Skin: Skin is warm and dry.  Psychiatric: He has a normal mood and affect. His behavior is normal. Judgment and thought content normal.       Assessment & Plan:

## 2014-07-30 NOTE — Assessment & Plan Note (Signed)
Symptoms and exam consistent with sacroiliac dysfunction. Continue conservative management at this time with heat and stretching. Start naproxen as needed for inflammation and pain. Follow-up if symptoms worsen or fail to improve with conservative management.

## 2014-08-27 ENCOUNTER — Encounter: Payer: BLUE CROSS/BLUE SHIELD | Admitting: Family

## 2014-10-15 ENCOUNTER — Encounter: Payer: BLUE CROSS/BLUE SHIELD | Admitting: Family

## 2014-10-15 DIAGNOSIS — Z0289 Encounter for other administrative examinations: Secondary | ICD-10-CM

## 2014-10-16 ENCOUNTER — Telehealth: Payer: Self-pay | Admitting: Family

## 2014-10-16 NOTE — Telephone Encounter (Signed)
Ok to reschedule

## 2014-10-16 NOTE — Telephone Encounter (Signed)
Patient states wife will call back to reschedule.

## 2014-10-16 NOTE — Telephone Encounter (Signed)
Patient no showed for cpe on 7/19.  Please advise.

## 2015-05-29 ENCOUNTER — Ambulatory Visit (INDEPENDENT_AMBULATORY_CARE_PROVIDER_SITE_OTHER): Payer: BLUE CROSS/BLUE SHIELD | Admitting: Family

## 2015-05-29 ENCOUNTER — Encounter: Payer: Self-pay | Admitting: Family

## 2015-05-29 ENCOUNTER — Other Ambulatory Visit: Payer: Self-pay | Admitting: Family

## 2015-05-29 VITALS — BP 126/84 | HR 76 | Temp 97.6°F | Resp 14 | Ht 72.0 in | Wt 225.0 lb

## 2015-05-29 DIAGNOSIS — Z1211 Encounter for screening for malignant neoplasm of colon: Secondary | ICD-10-CM | POA: Insufficient documentation

## 2015-05-29 DIAGNOSIS — Z0000 Encounter for general adult medical examination without abnormal findings: Secondary | ICD-10-CM

## 2015-05-29 DIAGNOSIS — Z23 Encounter for immunization: Secondary | ICD-10-CM

## 2015-05-29 NOTE — Progress Notes (Signed)
Subjective:    Patient ID: Carlos Harrison, male    DOB: October 02, 1960, 55 y.o.   MRN: [id number]  Chief Complaint  Patient presents with  . CPE    not fasting, cologuard    HPI:  Carlos Harrison is a 55 y.o. male who presents today for an annual wellness visit.   1) Health Maintenance -   Diet - Eats 1 meal per day with numerous snacks throughout the day; consisting of some fruits and vegetables, pork, chicken, fish; Caffeine 7-10 cups of caffeine daily  Exercise - Walks at work with no structured exercise.    2) Preventative Exams / Immunizations:  Dental -- Up to date  Vision -- Up to date   Health Maintenance  Topic Date Due  . Hepatitis C Screening  07/29/1962  . HIV Screening  08/21/1977  . TETANUS/TDAP  08/21/1981  . COLONOSCOPY  08/21/2012  . INFLUENZA VACCINE  12/12/2015 (Originally 10/28/2014)    Immunization History  Administered Date(s) Administered  . Tdap 05/29/2015    No Known Allergies   Outpatient Prescriptions Prior to Visit  Medication Sig Dispense Refill  . naproxen (NAPROSYN) 500 MG tablet Take 1 tablet (500 mg total) by mouth 2 (two) times daily with a meal. 60 tablet 0   No facility-administered medications prior to visit.     History reviewed. No pertinent past medical history.   Past Surgical History  Procedure Laterality Date  . Eye surgery Left      Family History  Problem Relation Age of Onset  . Ovarian cancer Mother      Social History   Social History  . Marital Status: Married    Spouse Name: N/A  . Number of Children: 71  . Years of Education: 16   Occupational History  . [employer]    Social History Main Topics  . Smoking status: Never Smoker   . Smokeless tobacco: Never Used  . Alcohol Use: No  . Drug Use: No  . Sexual Activity: Not on file   Other Topics Concern  . Not on file   Social History Narrative   Fun: Mentoring youth, solitary games.    Denies any religious beliefs effecting  health care.      Review of Systems  Constitutional: Denies fever, chills, fatigue, or significant weight gain/loss. HENT: Head: Denies headache or neck pain Ears: Denies changes in hearing, ringing in ears, earache, drainage Nose: Denies discharge, stuffiness, itching, nosebleed, sinus pain Throat: Denies sore throat, hoarseness, dry mouth, sores, thrush Eyes: Denies loss/changes in vision, pain, redness, blurry/double vision, flashing lights Cardiovascular: Denies chest pain/discomfort, tightness, palpitations, shortness of breath with activity, difficulty lying down, swelling, sudden awakening with shortness of breath Respiratory: Denies shortness of breath, cough, sputum production, wheezing Gastrointestinal: Denies dysphasia, heartburn, change in appetite, nausea, change in bowel habits, rectal bleeding, constipation, diarrhea, yellow skin or eyes Genitourinary: Denies frequency, urgency, burning/pain, blood in urine, incontinence, change in urinary strength. Musculoskeletal: Denies muscle/joint pain, stiffness, back pain, redness or swelling of joints, trauma Skin: Denies rashes, lumps, itching, dryness, color changes, or hair/nail changes Neurological: Denies dizziness, fainting, seizures, weakness, numbness, tingling, tremor Psychiatric - Denies nervousness, stress, depression or memory loss Endocrine: Denies heat or cold intolerance, sweating, frequent urination, excessive thirst, changes in appetite Hematologic: Denies ease of bruising or bleeding     Objective:    BP 126/84 mmHg  Pulse 76  Temp(Src) 97.6 F (36.4 C) (Oral)  Resp 14  Ht 6' (1.829  m)  Wt 225 lb (102.059 kg)  BMI 30.51 kg/m2  SpO2 99% Nursing note and vital signs reviewed.  Physical Exam  Constitutional: He is oriented to person, place, and time. He appears well-developed and well-nourished.  HENT:  Head: Normocephalic.  Right Ear: Hearing, tympanic membrane, external ear and ear canal normal.  Left  Ear: Hearing, tympanic membrane, external ear and ear canal normal.  Nose: Nose normal.  Mouth/Throat: Uvula is midline, oropharynx is clear and moist and mucous membranes are normal.  Eyes: Conjunctivae and EOM are normal. Pupils are equal, round, and reactive to light.  Neck: Neck supple. No JVD present. No tracheal deviation present. No thyromegaly present.  Cardiovascular: Normal rate, regular rhythm, normal heart sounds and intact distal pulses.   Pulmonary/Chest: Effort normal and breath sounds normal.  Abdominal: Soft. Bowel sounds are normal. He exhibits no distension and no mass. There is no tenderness. There is no rebound and no guarding.  Musculoskeletal: Normal range of motion. He exhibits no edema or tenderness.  Lymphadenopathy:    He has no cervical adenopathy.  Neurological: He is alert and oriented to person, place, and time. He has normal reflexes. No cranial nerve deficit. He exhibits normal muscle tone. Coordination normal.  Skin: Skin is warm and dry.  Psychiatric: He has a normal mood and affect. His behavior is normal. Judgment and thought content normal.       Assessment & Plan:   Problem List Items Addressed This Visit      Other   Routine general medical examination at a health care facility - Primary    1) Anticipatory Guidance: Discussed importance of wearing a seatbelt while driving and not texting while driving; changing batteries in smoke detector at least once annually; wearing suntan lotion when outside; eating a balanced and moderate diet; getting physical activity at least 30 minutes per day.  2) Immunizations / Screenings / Labs:  Tetanus updated today. Declines flu shot. All other immunizations are up to date per recommendations. Due for colon cancer screening - declines colonoscopy and agrees to Cologuard with paperwork complete. Obtain hepatitis C antibody for hepatitis C screening. All other screenings are up to date per recommendations. Obtain CBC,  CMET,  And Lipid profile.   Overall well exam with risk factors for cardiovascular disease including overweight/obesity. Recommend increasing physical activity outside of work to 30 minutes of moderate activity daily. Discussed importance of nutrition and consuming a balanced, varied and moderated diet that is low in saturated fats or sugary/procressed foods. Recommended weight loss goal of 5-10% of current body weight. Continue other healthy lifestyle behaviors and choices. Follow up prevention exam in 1 year. Follow up office pending blood work as needed.        Relevant Orders   Hepatitis C antibody   PSA   Lipid panel   Comprehensive metabolic panel   CBC    Other Visit Diagnoses    Need for Tdap vaccination        Relevant Orders    Tdap vaccine greater than or equal to 7yo IM (Completed)

## 2015-05-29 NOTE — Progress Notes (Signed)
Pre visit review using our clinic review tool, if applicable. No additional management support is needed unless otherwise documented below in the visit note. 

## 2015-05-29 NOTE — Assessment & Plan Note (Signed)
1) Anticipatory Guidance: Discussed importance of wearing a seatbelt while driving and not texting while driving; changing batteries in smoke detector at least once annually; wearing suntan lotion when outside; eating a balanced and moderate diet; getting physical activity at least 30 minutes per day.  2) Immunizations / Screenings / Labs:  Tetanus updated today. Declines flu shot. All other immunizations are up to date per recommendations. Due for colon cancer screening - declines colonoscopy and agrees to Cologuard with paperwork complete. Obtain hepatitis C antibody for hepatitis C screening. All other screenings are up to date per recommendations. Obtain CBC, CMET,  And Lipid profile.   Overall well exam with risk factors for cardiovascular disease including overweight/obesity. Recommend increasing physical activity outside of work to 30 minutes of moderate activity daily. Discussed importance of nutrition and consuming a balanced, varied and moderated diet that is low in saturated fats or sugary/procressed foods. Recommended weight loss goal of 5-10% of current body weight. Continue other healthy lifestyle behaviors and choices. Follow up prevention exam in 1 year. Follow up office pending blood work as needed.

## 2015-05-29 NOTE — Patient Instructions (Signed)
Thank you for choosing [facility].  Summary/Instructions:  Please stop by the lab on the basement level of the building for your blood work. Your results will be released to MyChart (or called to you) after review, usually within 72 hours after test completion. If any changes need to be made, you will be notified at that same time.  Health Maintenance, Male A healthy lifestyle and preventative care can promote health and wellness.  Maintain regular health, dental, and eye exams.  Eat a healthy diet. Foods like vegetables, fruits, whole grains, low-fat dairy products, and lean protein foods contain the nutrients you need and are low in calories. Decrease your intake of foods high in solid fats, added sugars, and salt. Get information about a proper diet from your health care provider, if necessary.  Regular physical exercise is one of the most important things you can do for your health. Most adults should get at least 150 minutes of moderate-intensity exercise (any activity that increases your heart rate and causes you to sweat) each week. In addition, most adults need muscle-strengthening exercises on 2 or more days a week.   Maintain a healthy weight. The body mass index (BMI) is a screening tool to identify possible weight problems. It provides an estimate of body fat based on height and weight. Your health care provider can find your BMI and can help you achieve or maintain a healthy weight. For males 20 years and older:  A BMI below 18.5 is considered underweight.  A BMI of 18.5 to 24.9 is normal.  A BMI of 25 to 29.9 is considered overweight.  A BMI of 30 and above is considered obese.  Maintain normal blood lipids and cholesterol by exercising and minimizing your intake of saturated fat. Eat a balanced diet with plenty of fruits and vegetables. Blood tests for lipids and cholesterol should begin at age 20 and be repeated every 5 years. If your lipid or cholesterol levels are  high, you are over age 50, or you are at high risk for heart disease, you may need your cholesterol levels checked more frequently.Ongoing high lipid and cholesterol levels should be treated with medicines if diet and exercise are not working.  If you smoke, find out from your health care provider how to quit. If you do not use tobacco, do not start.  Lung cancer screening is recommended for adults aged 55-80 years who are at high risk for developing lung cancer because of a history of smoking. A yearly low-dose CT scan of the lungs is recommended for people who have at least a 30-pack-year history of smoking and are current smokers or have quit within the past 15 years. A pack year of smoking is smoking an average of 1 pack of cigarettes a day for 1 year (for example, a 30-pack-year history of smoking could mean smoking 1 pack a day for 30 years or 2 packs a day for 15 years). Yearly screening should continue until the smoker has stopped smoking for at least 15 years. Yearly screening should be stopped for people who develop a health problem that would prevent them from having lung cancer treatment.  If you choose to drink alcohol, do not have more than 2 drinks per day. One drink is considered to be 12 oz (360 mL) of beer, 5 oz (150 mL) of wine, or 1.5 oz (45 mL) of liquor.  Avoid the use of street drugs. Do not share needles with anyone. Ask for help if you need   support or instructions about stopping the use of drugs.  High blood pressure causes heart disease and increases the risk of stroke. High blood pressure is more likely to develop in:  People who have blood pressure in the end of the normal range (100-139/85-89 mm Hg).  People who are overweight or obese.  People who are African American.  If you are 18-39 years of age, have your blood pressure checked every 3-5 years. If you are 40 years of age or older, have your blood pressure checked every year. You should have your blood pressure  measured twice--once when you are at a hospital or clinic, and once when you are not at a hospital or clinic. Record the average of the two measurements. To check your blood pressure when you are not at a hospital or clinic, you can use:  An automated blood pressure machine at a pharmacy.  A home blood pressure monitor.  If you are 45-79 years old, ask your health care provider if you should take aspirin to prevent heart disease.  Diabetes screening involves taking a blood sample to check your fasting blood sugar level. This should be done once every 3 years after age 45 if you are at a normal weight and without risk factors for diabetes. Testing should be considered at a younger age or be carried out more frequently if you are overweight and have at least 1 risk factor for diabetes.  Colorectal cancer can be detected and often prevented. Most routine colorectal cancer screening begins at the age of 50 and continues through age 75. However, your health care provider may recommend screening at an earlier age if you have risk factors for colon cancer. On a yearly basis, your health care provider may provide home test kits to check for hidden blood in the stool. A small camera at the end of a tube may be used to directly examine the colon (sigmoidoscopy or colonoscopy) to detect the earliest forms of colorectal cancer. Talk to your health care provider about this at age 50 when routine screening begins. A direct exam of the colon should be repeated every 5-10 years through age 75, unless early forms of precancerous polyps or small growths are found.  People who are at an increased risk for hepatitis B should be screened for this virus. You are considered at high risk for hepatitis B if:  You were born in a country where hepatitis B occurs often. Talk with your health care provider about which countries are considered high risk.  Your parents were born in a high-risk country and you have not received a  shot to protect against hepatitis B (hepatitis B vaccine).  You have HIV or AIDS.  You use needles to inject street drugs.  You live with, or have sex with, someone who has hepatitis B.  You are a man who has sex with other men (MSM).  You get hemodialysis treatment.  You take certain medicines for conditions like cancer, organ transplantation, and autoimmune conditions.  Hepatitis C blood testing is recommended for all people born from 1945 through 1965 and any individual with known risk factors for hepatitis C.  Healthy men should no longer receive prostate-specific antigen (PSA) blood tests as part of routine cancer screening. Talk to your health care provider about prostate cancer screening.  Testicular cancer screening is not recommended for adolescents or adult males who have no symptoms. Screening includes self-exam, a health care provider exam, and other screening tests. Consult with your   health care provider about any symptoms you have or any concerns you have about testicular cancer.  Practice safe sex. Use condoms and avoid high-risk sexual practices to reduce the spread of sexually transmitted infections (STIs).  You should be screened for STIs, including gonorrhea and chlamydia if:  You are sexually active and are younger than 24 years.  You are older than 24 years, and your health care provider tells you that you are at risk for this type of infection.  Your sexual activity has changed since you were last screened, and you are at an increased risk for chlamydia or gonorrhea. Ask your health care provider if you are at risk.  If you are at risk of being infected with HIV, it is recommended that you take a prescription medicine daily to prevent HIV infection. This is called pre-exposure prophylaxis (PrEP). You are considered at risk if:  You are a man who has sex with other men (MSM).  You are a heterosexual man who is sexually active with multiple partners.  You take  drugs by injection.  You are sexually active with a partner who has HIV.  Talk with your health care provider about whether you are at high risk of being infected with HIV. If you choose to begin PrEP, you should first be tested for HIV. You should then be tested every 3 months for as long as you are taking PrEP.  Use sunscreen. Apply sunscreen liberally and repeatedly throughout the day. You should seek shade when your shadow is shorter than you. Protect yourself by wearing long sleeves, pants, a wide-brimmed hat, and sunglasses year round whenever you are outdoors.  Tell your health care provider of new moles or changes in moles, especially if there is a change in shape or color. Also, tell your health care provider if a mole is larger than the size of a pencil eraser.  A one-time screening for abdominal aortic aneurysm (AAA) and surgical repair of large AAAs by ultrasound is recommended for men aged 65-75 years who are current or former smokers.  Stay current with your vaccines (immunizations).   This information is not intended to replace advice given to you by your health care provider. Make sure you discuss any questions you have with your health care provider.   Document Released: 09/11/2007 Document Revised: 04/05/2014 Document Reviewed: 08/10/2010 Elsevier Interactive Patient Education 2016 [employer]  

## 2015-06-02 ENCOUNTER — Other Ambulatory Visit (INDEPENDENT_AMBULATORY_CARE_PROVIDER_SITE_OTHER): Payer: BLUE CROSS/BLUE SHIELD

## 2015-06-02 DIAGNOSIS — Z0000 Encounter for general adult medical examination without abnormal findings: Secondary | ICD-10-CM

## 2015-06-02 LAB — COMPREHENSIVE METABOLIC PANEL
ALT: 16 U/L (ref 0–53)
AST: 18 U/L (ref 0–37)
Albumin: 4.2 g/dL (ref 3.5–5.2)
Alkaline Phosphatase: 51 U/L (ref 39–117)
BUN: 15 mg/dL (ref 6–23)
CO2: 28 mEq/L (ref 19–32)
Calcium: 9 mg/dL (ref 8.4–10.5)
Chloride: 104 mEq/L (ref 96–112)
Creatinine, Ser: 0.92 mg/dL (ref 0.40–1.50)
GFR: 110.77 mL/min (ref >60.00)
Glucose, Bld: 85 mg/dL (ref 70–99)
Potassium: 4.5 mEq/L (ref 3.5–5.1)
Sodium: 138 mEq/L (ref 135–145)
Total Bilirubin: 0.7 mg/dL (ref 0.2–1.2)
Total Protein: 6.9 g/dL (ref 6.0–8.3)

## 2015-06-02 LAB — LIPID PANEL
Cholesterol: 240 mg/dL — ABNORMAL HIGH (ref 0–200)
HDL: 50.1 mg/dL (ref >39.00)
LDL Cholesterol: 178 mg/dL — ABNORMAL HIGH (ref 0–99)
NonHDL: 189.85
Total CHOL/HDL Ratio: 5
Triglycerides: 59 mg/dL (ref 0.0–149.0)
VLDL: 11.8 mg/dL (ref 0.0–40.0)

## 2015-06-02 LAB — CBC
HCT: 42.2 % (ref 39.0–52.0)
Hemoglobin: 14.1 g/dL (ref 13.0–17.0)
MCHC: 33.4 g/dL (ref 30.0–36.0)
MCV: 85.2 fl (ref 78.0–100.0)
Platelets: 163 10*3/uL (ref 150.0–400.0)
RBC: 4.95 Mil/uL (ref 4.22–5.81)
RDW: 13.8 % (ref 11.5–15.5)
WBC: 4.1 10*3/uL (ref 4.0–10.5)

## 2015-06-02 LAB — PSA: PSA: 0.96 ng/mL (ref 0.10–4.00)

## 2015-06-03 ENCOUNTER — Telehealth: Payer: Self-pay | Admitting: Family

## 2015-06-03 LAB — HEPATITIS C ANTIBODY: HCV Ab: NEGATIVE

## 2015-06-03 NOTE — Telephone Encounter (Signed)
Please inform patient that his hepatitis C was negative. His other blood work showed that his prostate function, kidney function, liver function, electrolytes and white/red blood cells are all within the normal limits. His cholesterol was elevated with his LDL or bad cholesterol being 178. These numbers place his risk for coronary artery disease at 9% which is greater than the threshold of 7.5% for cholesterol medication. I would recommend starting him on a statin medication like Crestor, Pravachol, or Lipitor. In addition this can also be controlled with nutrition and physical activity. If he is willing, I will send the medication to his pharmacy.

## 2015-06-04 NOTE — Telephone Encounter (Signed)
Pt aware of results and he is interested in trying a cholesterol medication. Informed pt that I will let him know when a medication has been sent.

## 2015-06-05 MED ORDER — ROSUVASTATIN CALCIUM 10 MG PO TABS: 10.0000 mg | ORAL_TABLET | Freq: Every day | ORAL | Status: DC

## 2015-06-05 NOTE — Telephone Encounter (Signed)
Medication sent.

## 2015-09-29 ENCOUNTER — Other Ambulatory Visit: Payer: Self-pay | Admitting: Family

## 2016-03-26 ENCOUNTER — Ambulatory Visit: Payer: BLUE CROSS/BLUE SHIELD | Admitting: Family

## 2016-03-30 ENCOUNTER — Ambulatory Visit: Payer: BLUE CROSS/BLUE SHIELD | Admitting: Family

## 2017-02-15 ENCOUNTER — Telehealth: Payer: Self-pay

## 2017-02-15 ENCOUNTER — Ambulatory Visit: Payer: BLUE CROSS/BLUE SHIELD

## 2017-02-15 VITALS — BP 155/110

## 2017-02-15 DIAGNOSIS — I10 Essential (primary) hypertension: Secondary | ICD-10-CM

## 2017-02-15 MED ORDER — IRBESARTAN 150 MG PO TABS: 150.0000 mg | ORAL_TABLET | Freq: Every day | ORAL | 6 refills | Status: DC

## 2017-02-15 NOTE — Telephone Encounter (Signed)
Patients wife sees dr burns---patient needs to transfer from [facility] to another provider here in [place] office---routing to dr burns----patient is requesting you as first choice, or dr plotnikov as second choice---please advise, if dr burns cant, I will route to dr plotnikov to request, thanks

## 2017-02-16 NOTE — Telephone Encounter (Signed)
Routing to Carlos Harrison/scheduling---can you call patient and get him in to get transferred/established with dr Saddie Benders, you can schedule within the next month if possible due to acute hypertension---he was given new bp med and was advised to call us if his med is not controlling bystolic number well, we may need to see him/make changes sooner than he sees dr burns--let me know if you have any questions, thanks

## 2017-02-16 NOTE — Telephone Encounter (Signed)
Patient scheduled 11/26

## 2017-02-16 NOTE — Telephone Encounter (Signed)
I can accept him.   

## 2017-02-21 ENCOUNTER — Other Ambulatory Visit (INDEPENDENT_AMBULATORY_CARE_PROVIDER_SITE_OTHER): Payer: BLUE CROSS/BLUE SHIELD

## 2017-02-21 ENCOUNTER — Ambulatory Visit: Payer: BLUE CROSS/BLUE SHIELD | Admitting: Internal Medicine

## 2017-02-21 ENCOUNTER — Encounter: Payer: Self-pay | Admitting: Internal Medicine

## 2017-02-21 DIAGNOSIS — I10 Essential (primary) hypertension: Secondary | ICD-10-CM

## 2017-02-21 DIAGNOSIS — E7849 Other hyperlipidemia: Secondary | ICD-10-CM

## 2017-02-21 DIAGNOSIS — E1169 Type 2 diabetes mellitus with other specified complication: Secondary | ICD-10-CM | POA: Insufficient documentation

## 2017-02-21 DIAGNOSIS — I152 Hypertension secondary to endocrine disorders: Secondary | ICD-10-CM | POA: Insufficient documentation

## 2017-02-21 DIAGNOSIS — E785 Hyperlipidemia, unspecified: Secondary | ICD-10-CM | POA: Insufficient documentation

## 2017-02-21 LAB — COMPREHENSIVE METABOLIC PANEL
ALT: 21 U/L (ref 0–53)
AST: 23 U/L (ref 0–37)
Albumin: 4.3 g/dL (ref 3.5–5.2)
Alkaline Phosphatase: 39 U/L (ref 39–117)
BUN: 14 mg/dL (ref 6–23)
CO2: 30 mEq/L (ref 19–32)
Calcium: 9.3 mg/dL (ref 8.4–10.5)
Chloride: 103 mEq/L (ref 96–112)
Creatinine, Ser: 1.07 mg/dL (ref 0.40–1.50)
GFR: 92.45 mL/min (ref >60.00)
Glucose, Bld: 95 mg/dL (ref 70–99)
Potassium: 4.2 mEq/L (ref 3.5–5.1)
Sodium: 140 mEq/L (ref 135–145)
Total Bilirubin: 0.9 mg/dL (ref 0.2–1.2)
Total Protein: 7.5 g/dL (ref 6.0–8.3)

## 2017-02-21 LAB — LIPID PANEL
Cholesterol: 264 mg/dL — ABNORMAL HIGH (ref 0–200)
HDL: 46.9 mg/dL (ref >39.00)
LDL Cholesterol: 201 mg/dL — ABNORMAL HIGH (ref 0–99)
NonHDL: 217.29
Total CHOL/HDL Ratio: 6
Triglycerides: 82 mg/dL (ref 0.0–149.0)
VLDL: 16.4 mg/dL (ref 0.0–40.0)

## 2017-02-21 LAB — TSH: TSH: 2.41 u[IU]/mL (ref 0.35–4.50)

## 2017-02-21 MED ORDER — IRBESARTAN 150 MG PO TABS: 150.0000 mg | ORAL_TABLET | Freq: Every day | ORAL | 6 refills | Status: DC

## 2017-02-21 MED ORDER — AMLODIPINE BESYLATE 5 MG PO TABS: 5.0000 mg | ORAL_TABLET | Freq: Every day | ORAL | 3 refills | Status: DC

## 2017-02-21 NOTE — Progress Notes (Signed)
Subjective:    Patient ID: Carlos Harrison, male    DOB: [birth year], 56 y.o.   MRN: [id number]  HPI  He is here to establish with a new pcp.   The patient is here for follow up.  Hypertension: He is taking his medication daily. He is not compliant with a low sodium diet.  He denies chest pain, palpitations, edema, shortness of breath and regular headaches. He is active, but not exercising regularly.    His BP was high on 11/15 at the dentist - 179/92.  At home it was 179/117.    Hyperlipidemia: He is not taking his crestor daily. He stopped due to lack of getting it refilled.  He did not have side effects.  He is not compliant with a low fat/cholesterol diet. He is not exercising regularly.     Medications and allergies reviewed with patient and updated if appropriate.  Patient Active Problem List   Diagnosis Date Noted  . Hypertension 02/21/2017  . Hyperlipidemia 02/21/2017  . Sacroiliac joint dysfunction of right side 07/30/2014    No current outpatient medications on file prior to visit.   No current facility-administered medications on file prior to visit.     No past medical history on file.  Past Surgical History:  Procedure Laterality Date  . EYE SURGERY Left     Social History   Socioeconomic History  . Marital status: Married    Spouse name: None  . Number of children: 9  . Years of education: 3  . Highest education level: None  Social Needs  . Financial resource strain: None  . Food insecurity - worry: None  . Food insecurity - inability: None  . Transportation needs - medical: None  . Transportation needs - non-medical: None  Occupational History  . Occupation: [employer]  Tobacco Use  . Smoking status: Never Smoker  . Smokeless tobacco: Never Used  Substance and Sexual Activity  . Alcohol use: No  . Drug use: No  . Sexual activity: None  Other Topics Concern  . None  Social History Narrative   Fun: Mentoring youth, solitary games.    Denies any religious beliefs effecting health care.     Family History  Problem Relation Age of Onset  . Ovarian cancer Mother     Review of Systems  Constitutional: Negative for chills and fever.  Eyes: Negative for visual disturbance.  Respiratory: Negative for cough, shortness of breath and wheezing.   Cardiovascular: Positive for palpitations (occasional). Negative for chest pain and leg swelling.  Gastrointestinal: Negative for abdominal pain, blood in stool, constipation, diarrhea and nausea.  Neurological: Positive for headaches (occ, mild). Negative for dizziness and light-headedness.       Objective:   Vitals:   02/21/17 1525  BP: (!) 164/98  Pulse: 73  Resp: 16  Temp: 97.9 F (36.6 C)  SpO2: 98%   Wt Readings from Last 3 Encounters:  02/21/17 229 lb (103.9 kg)  05/29/15 225 lb (102.1 kg)  07/30/14 232 lb (105.2 kg)   Body mass index is 31.06 kg/m.   Physical Exam    Constitutional: Appears well-developed and well-nourished. No distress.  HENT:  Head: Normocephalic and atraumatic.  Neck: Neck supple. No tracheal deviation present. No thyromegaly present.  No cervical lymphadenopathy Cardiovascular: Normal rate, regular rhythm and normal heart sounds.   No murmur heard. No carotid bruit .  No edema Pulmonary/Chest: Effort normal and breath sounds normal. No respiratory distress. No has no  wheezes. No rales.  Abdomen: soft, NT, ND Skin: Skin is warm and dry. Not diaphoretic.  Psychiatric: Normal mood and affect. Behavior is normal.      Assessment & Plan:    See Problem List for Assessment and Plan of chronic medical problems.

## 2017-02-21 NOTE — Assessment & Plan Note (Signed)
Not controlled Stressed low sodium diet, regular exercise Continue avaparo at 150 mg  Start amlodipine 5 mg Will likely need an increase in one or both monitor BP at home  - call in 1-2 weeks with BP -- will adjust medications cmp Follow up in 2 months

## 2017-02-21 NOTE — Assessment & Plan Note (Addendum)
Not taking crestor Needs to exercise and improve diet Will check lipids, tsh -not fasting, but will still check Consider restarting statin

## 2017-02-21 NOTE — Patient Instructions (Addendum)
Test(s) ordered today. Your results will be released to [place] (or called to you) after review, usually within 72hours after test completion. If any changes need to be made, you will be notified at that same time.   Medications reviewed and updated.  Changes include starting amlodipine 5 mg daily.  Continue your other blood pressure medication  Monitor your BP at home.   Your prescription(s) have been submitted to your pharmacy. Please take as directed and contact our office if you believe you are having problem(s) with the medication(s).   Please followup in 2 months     Hypertension Hypertension, commonly called high blood pressure, is when the force of blood pumping through the arteries is too strong. The arteries are the blood vessels that carry blood from the heart throughout the body. Hypertension forces the heart to work harder to pump blood and may cause arteries to become narrow or stiff. Having untreated or uncontrolled hypertension can cause heart attacks, strokes, kidney disease, and other problems. A blood pressure reading consists of a higher number over a lower number. Ideally, your blood pressure should be below 120/80. The first ("top") number is called the systolic pressure. It is a measure of the pressure in your arteries as your heart beats. The second ("bottom") number is called the diastolic pressure. It is a measure of the pressure in your arteries as the heart relaxes. What are the causes? The cause of this condition is not known. What increases the risk? Some risk factors for high blood pressure are under your control. Others are not. Factors you can change  Smoking.  Having type 2 diabetes mellitus, high cholesterol, or both.  Not getting enough exercise or physical activity.  Being overweight.  Having too much fat, sugar, calories, or salt (sodium) in your diet.  Drinking too much alcohol. Factors that are difficult or impossible to change  Having  chronic kidney disease.  Having a family history of high blood pressure.  Age. Risk increases with age.  Race. You may be at higher risk if you are African-American.  Gender. Men are at higher risk than women before age 25. After age 73, women are at higher risk than men.  Having obstructive sleep apnea.  Stress. What are the signs or symptoms? Extremely high blood pressure (hypertensive crisis) may cause:  Headache.  Anxiety.  Shortness of breath.  Nosebleed.  Nausea and vomiting.  Severe chest pain.  Jerky movements you cannot control (seizures).  How is this diagnosed? This condition is diagnosed by measuring your blood pressure while you are seated, with your arm resting on a surface. The cuff of the blood pressure monitor will be placed directly against the skin of your upper arm at the level of your heart. It should be measured at least twice using the same arm. Certain conditions can cause a difference in blood pressure between your right and left arms. Certain factors can cause blood pressure readings to be lower or higher than normal (elevated) for a short period of time:  When your blood pressure is higher when you are in a health care provider's office than when you are at home, this is called white coat hypertension. Most people with this condition do not need medicines.  When your blood pressure is higher at home than when you are in a health care provider's office, this is called masked hypertension. Most people with this condition may need medicines to control blood pressure.  If you have a high blood pressure  reading during one visit or you have normal blood pressure with other risk factors:  You may be asked to return on a different day to have your blood pressure checked again.  You may be asked to monitor your blood pressure at home for 1 week or longer.  If you are diagnosed with hypertension, you may have other blood or imaging tests to help your  health care provider understand your overall risk for other conditions. How is this treated? This condition is treated by making healthy lifestyle changes, such as eating healthy foods, exercising more, and reducing your alcohol intake. Your health care provider may prescribe medicine if lifestyle changes are not enough to get your blood pressure under control, and if:  Your systolic blood pressure is above 130.  Your diastolic blood pressure is above 80.  Your personal target blood pressure may vary depending on your medical conditions, your age, and other factors. Follow these instructions at home: Eating and drinking  Eat a diet that is high in fiber and potassium, and low in sodium, added sugar, and fat. An example eating plan is called the DASH (Dietary Approaches to Stop Hypertension) diet. To eat this way: ? Eat plenty of fresh fruits and vegetables. Try to fill half of your plate at each meal with fruits and vegetables. ? Eat whole grains, such as whole wheat pasta, Claud rice, or whole grain bread. Fill about one quarter of your plate with whole grains. ? Eat or drink low-fat dairy products, such as skim milk or low-fat yogurt. ? Avoid fatty cuts of meat, processed or cured meats, and poultry with skin. Fill about one quarter of your plate with lean proteins, such as fish, chicken without skin, beans, eggs, and tofu. ? Avoid premade and processed foods. These tend to be higher in sodium, added sugar, and fat.  Reduce your daily sodium intake. Most people with hypertension should eat less than 1,500 mg of sodium a day.  Limit alcohol intake to no more than 1 drink a day for nonpregnant women and 2 drinks a day for men. One drink equals 12 oz of beer, 5 oz of wine, or 1 oz of hard liquor. Lifestyle  Work with your health care provider to maintain a healthy body weight or to lose weight. Ask what an ideal weight is for you.  Get at least 30 minutes of exercise that causes your heart  to beat faster (aerobic exercise) most days of the week. Activities may include walking, swimming, or biking.  Include exercise to strengthen your muscles (resistance exercise), such as pilates or lifting weights, as part of your weekly exercise routine. Try to do these types of exercises for 30 minutes at least 3 days a week.  Do not use any products that contain nicotine or tobacco, such as cigarettes and e-cigarettes. If you need help quitting, ask your health care provider.  Monitor your blood pressure at home as told by your health care provider.  Keep all follow-up visits as told by your health care provider. This is important. Medicines  Take over-the-counter and prescription medicines only as told by your health care provider. Follow directions carefully. Blood pressure medicines must be taken as prescribed.  Do not skip doses of blood pressure medicine. Doing this puts you at risk for problems and can make the medicine less effective.  Ask your health care provider about side effects or reactions to medicines that you should watch for. Contact a health care provider if:  You  think you are having a reaction to a medicine you are taking.  You have headaches that keep coming back (recurring).  You feel dizzy.  You have swelling in your ankles.  You have trouble with your vision. Get help right away if:  You develop a severe headache or confusion.  You have unusual weakness or numbness.  You feel faint.  You have severe pain in your chest or abdomen.  You vomit repeatedly.  You have trouble breathing. Summary  Hypertension is when the force of blood pumping through your arteries is too strong. If this condition is not controlled, it may put you at risk for serious complications.  Your personal target blood pressure may vary depending on your medical conditions, your age, and other factors. For most people, a normal blood pressure is less than 120/80.  Hypertension  is treated with lifestyle changes, medicines, or a combination of both. Lifestyle changes include weight loss, eating a healthy, low-sodium diet, exercising more, and limiting alcohol. This information is not intended to replace advice given to you by your health care provider. Make sure you discuss any questions you have with your health care provider. Document Released: 03/15/2005 Document Revised: 02/11/2016 Document Reviewed: 02/11/2016 Elsevier Interactive Patient Education  [employer].

## 2017-02-23 ENCOUNTER — Encounter: Payer: Self-pay | Admitting: Internal Medicine

## 2017-04-26 NOTE — Progress Notes (Signed)
Subjective:    Patient ID: Carlos Harrison, male    DOB: May 15, 1960, 57 y.o.   MRN: [id number]  HPI The patient is here for follow up.  Hypertension: He is taking his medication daily. He is compliant with a low sodium diet.  He denies chest pain, palpitations, edema, shortness of breath and regular headaches. He is exercising minimally - he is active with his job.      Hyperlipidemia: He is not taking his medication daily - he was on crestor and he was never really taking it regularly.  He denies having any side effects.  He states he just stopped taking it. He is not compliant with a low fat/cholesterol diet. He is exercising minimally.  Prediabetes:  He is more compliant with a low sugar/carbohydrate diet.  He is not exercising regularly.   Medications and allergies reviewed with patient and updated if appropriate.  Patient Active Problem List   Diagnosis Date Noted  . Hypertension 02/21/2017  . Hyperlipidemia 02/21/2017  . Sacroiliac joint dysfunction of right side 07/30/2014    Current Outpatient Medications on File Prior to Visit  Medication Sig Dispense Refill  . amLODipine (NORVASC) 5 MG tablet Take 1 tablet (5 mg total) by mouth daily. 90 tablet 3  . irbesartan (AVAPRO) 150 MG tablet Take 1 tablet (150 mg total) by mouth daily. 30 tablet 6   No current facility-administered medications on file prior to visit.     No past medical history on file.  Past Surgical History:  Procedure Laterality Date  . EYE SURGERY Left     Social History   Socioeconomic History  . Marital status: Married    Spouse name: None  . Number of children: 9  . Years of education: 62  . Highest education level: None  Social Needs  . Financial resource strain: None  . Food insecurity - worry: None  . Food insecurity - inability: None  . Transportation needs - medical: None  . Transportation needs - non-medical: None  Occupational History  . Occupation: [employer]  Tobacco Use   . Smoking status: Never Smoker  . Smokeless tobacco: Never Used  Substance and Sexual Activity  . Alcohol use: No  . Drug use: No  . Sexual activity: None  Other Topics Concern  . None  Social History Narrative   Fun: Mentoring youth, solitary games.    Denies any religious beliefs effecting health care.     Family History  Problem Relation Age of Onset  . Ovarian cancer Mother     Review of Systems  Constitutional: Negative for chills and fever.  Respiratory: Positive for cough. Negative for shortness of breath and wheezing.   Cardiovascular: Negative for chest pain, palpitations and leg swelling.  Neurological: Positive for dizziness (occ). Negative for light-headedness and headaches.       Objective:   Vitals:   04/27/17 1340  BP: 122/70  Pulse: 66  Resp: 16  Temp: (!) 97.4 F (36.3 C)  SpO2: 99%   Wt Readings from Last 3 Encounters:  04/27/17 229 lb (103.9 kg)  02/21/17 229 lb (103.9 kg)  05/29/15 225 lb (102.1 kg)   Body mass index is 31.06 kg/m.   Physical Exam    Constitutional: Appears well-developed and well-nourished. No distress.  HENT:  Head: Normocephalic and atraumatic.  Neck: Neck supple. No tracheal deviation present. No thyromegaly present.  No cervical lymphadenopathy Cardiovascular: Normal rate, regular rhythm and normal heart sounds.   No murmur  heard. No carotid bruit .  No edema Pulmonary/Chest: Effort normal and breath sounds normal. No respiratory distress. No has no wheezes. No rales.  Skin: Skin is warm and dry. Not diaphoretic.  Psychiatric: Normal mood and affect. Behavior is normal.      Assessment & Plan:    See Problem List for Assessment and Plan of chronic medical problems.

## 2017-04-27 ENCOUNTER — Encounter: Payer: Self-pay | Admitting: Internal Medicine

## 2017-04-27 ENCOUNTER — Ambulatory Visit (INDEPENDENT_AMBULATORY_CARE_PROVIDER_SITE_OTHER): Payer: BLUE CROSS/BLUE SHIELD | Admitting: Internal Medicine

## 2017-04-27 ENCOUNTER — Other Ambulatory Visit (INDEPENDENT_AMBULATORY_CARE_PROVIDER_SITE_OTHER): Payer: BLUE CROSS/BLUE SHIELD

## 2017-04-27 VITALS — BP 122/70 | HR 66 | Temp 97.4°F | Resp 16 | Wt 229.0 lb

## 2017-04-27 DIAGNOSIS — E7849 Other hyperlipidemia: Secondary | ICD-10-CM | POA: Diagnosis not present

## 2017-04-27 DIAGNOSIS — E1169 Type 2 diabetes mellitus with other specified complication: Secondary | ICD-10-CM | POA: Insufficient documentation

## 2017-04-27 DIAGNOSIS — I10 Essential (primary) hypertension: Secondary | ICD-10-CM | POA: Diagnosis not present

## 2017-04-27 DIAGNOSIS — R7303 Prediabetes: Secondary | ICD-10-CM | POA: Insufficient documentation

## 2017-04-27 DIAGNOSIS — R739 Hyperglycemia, unspecified: Secondary | ICD-10-CM

## 2017-04-27 DIAGNOSIS — E119 Type 2 diabetes mellitus without complications: Secondary | ICD-10-CM | POA: Insufficient documentation

## 2017-04-27 LAB — LIPID PANEL
Cholesterol: 202 mg/dL — ABNORMAL HIGH (ref 0–200)
HDL: 38.9 mg/dL — ABNORMAL LOW (ref >39.00)
LDL Cholesterol: 136 mg/dL — ABNORMAL HIGH (ref 0–99)
NonHDL: 163.27
Total CHOL/HDL Ratio: 5
Triglycerides: 136 mg/dL (ref 0.0–149.0)
VLDL: 27.2 mg/dL (ref 0.0–40.0)

## 2017-04-27 LAB — COMPREHENSIVE METABOLIC PANEL
ALT: 21 U/L (ref 0–53)
AST: 21 U/L (ref 0–37)
Albumin: 4.1 g/dL (ref 3.5–5.2)
Alkaline Phosphatase: 46 U/L (ref 39–117)
BUN: 15 mg/dL (ref 6–23)
CO2: 28 mEq/L (ref 19–32)
Calcium: 8.8 mg/dL (ref 8.4–10.5)
Chloride: 104 mEq/L (ref 96–112)
Creatinine, Ser: 1.02 mg/dL (ref 0.40–1.50)
GFR: 97.63 mL/min (ref >60.00)
Glucose, Bld: 107 mg/dL — ABNORMAL HIGH (ref 70–99)
Potassium: 4.2 mEq/L (ref 3.5–5.1)
Sodium: 139 mEq/L (ref 135–145)
Total Bilirubin: 0.5 mg/dL (ref 0.2–1.2)
Total Protein: 7 g/dL (ref 6.0–8.3)

## 2017-04-27 LAB — HEMOGLOBIN A1C: Hgb A1c MFr Bld: 6.4 % (ref 4.6–6.5)

## 2017-04-27 NOTE — Assessment & Plan Note (Signed)
BP well controlled Current regimen effective and well tolerated Continue current medications at current doses cmp  

## 2017-04-27 NOTE — Assessment & Plan Note (Signed)
Check a1c Low sugar / carb diet Stressed regular exercise   

## 2017-04-27 NOTE — Patient Instructions (Addendum)
  Test(s) ordered today. Your results will be released to MyChart (or called to you) after review, usually within 72hours after test completion. If any changes need to be made, you will be notified at that same time.  Medications reviewed and updated.  No changes recommended at this time.    Please followup in 6 months   

## 2017-04-27 NOTE — Assessment & Plan Note (Signed)
Was on Crestor 10 mg daily and did not have any side effects, but he is not taking it regularly and eventually this was just stopped-he did not have any side effects Will recheck lipid panel today and expected to remain elevated Discussed with him that most likely he needs to be on medication, but once again will recheck lipid panel today Continue to work on improving diet and increasing exercise

## 2017-04-28 ENCOUNTER — Encounter: Payer: Self-pay | Admitting: Internal Medicine

## 2017-04-28 MED ORDER — ROSUVASTATIN CALCIUM 10 MG PO TABS: ORAL_TABLET | ORAL | 1 refills | Status: DC

## 2017-04-28 NOTE — Telephone Encounter (Signed)
The 10-year ASCVD risk score Mikey Bussing DC Brooke Bonito., et al., 2013) is: 10.4%   Values used to calculate the score:     Age: 57 years     Sex: Male     Is Non-Hispanic African American: Yes     Diabetic: No     Tobacco smoker: No     Systolic Blood Pressure: 517 mmHg     Is BP treated: Yes     HDL Cholesterol: 38.9 mg/dL     Total Cholesterol: 202 mg/dL

## 2017-05-27 ENCOUNTER — Encounter: Payer: Self-pay | Admitting: Internal Medicine

## 2017-10-22 ENCOUNTER — Other Ambulatory Visit: Payer: Self-pay | Admitting: Internal Medicine

## 2017-10-25 NOTE — Progress Notes (Signed)
Subjective:    Patient ID: Carlos Harrison, male    DOB: [birth year], 57 y.o.   MRN: [id number]  HPI The patient is here for follow up.  He states he feels good and has no concerns.  Hyperlipidemia: He is taking his medication daily. He is more compliant with a low fat/cholesterol diet. He is active with his ADL's - at work but no other exercise. He denies myalgias.   Prediabetes:  He is more compliant with a low sugar/carbohydrate diet.  He is active with his ADL's - at work but no other exercise.  Hypertension: He is taking his medication daily. He is more compliant with a low sodium diet.  He denies chest pain, edema, shortness of breath and regular headaches. He is active with his ADL's - at work but no other exercise.  He does not monitor his blood pressure at home.    Tachycardia:  He has tachycardia here today.  He states he has had occasional palpitations in the past, but has not had any since starting his BP medications.  He does feels some heart racing today, but really only after the CMA mentioned to him his HR was elevated.    Medications and allergies reviewed with patient and updated if appropriate.  Patient Active Problem List   Diagnosis Date Noted  . Atrial flutter ([place]) 10/26/2017  . Hyperglycemia 04/27/2017  . Hypertension 02/21/2017  . Hyperlipidemia 02/21/2017  . Sacroiliac joint dysfunction of right side 07/30/2014    Current Outpatient Medications on File Prior to Visit  Medication Sig Dispense Refill  . amLODipine (NORVASC) 5 MG tablet Take 1 tablet (5 mg total) by mouth daily. 90 tablet 3  . irbesartan (AVAPRO) 150 MG tablet Take 1 tablet (150 mg total) by mouth daily. 30 tablet 6  . rosuvastatin (CRESTOR) 10 MG tablet TAKE 1 TABLET BY MOUTH EVERY DAY 90 tablet 1   No current facility-administered medications on file prior to visit.     History reviewed. No pertinent past medical history.  Past Surgical History:  Procedure Laterality Date  . EYE  SURGERY Left     Social History   Socioeconomic History  . Marital status: Married    Spouse name: Not on file  . Number of children: 35  . Years of education: 51  . Highest education level: Not on file  Occupational History  . Occupation: [employer]  Social Needs  . Financial resource strain: Not on file  . Food insecurity:    Worry: Not on file    Inability: Not on file  . Transportation needs:    Medical: Not on file    Non-medical: Not on file  Tobacco Use  . Smoking status: Never Smoker  . Smokeless tobacco: Never Used  Substance and Sexual Activity  . Alcohol use: No  . Drug use: No  . Sexual activity: Not on file  Lifestyle  . Physical activity:    Days per week: Not on file    Minutes per session: Not on file  . Stress: Not on file  Relationships  . Social connections:    Talks on phone: Not on file    Gets together: Not on file    Attends religious service: Not on file    Active member of club or organization: Not on file    Attends meetings of clubs or organizations: Not on file    Relationship status: Not on file  Other Topics Concern  . Not on  file  Social History Narrative   Fun: Mentoring youth, solitary games.    Denies any religious beliefs effecting health care.     Family History  Problem Relation Age of Onset  . Ovarian cancer Mother     Review of Systems  Constitutional: Negative for chills and fever.  Respiratory: Negative for cough, shortness of breath and wheezing.   Cardiovascular: Positive for palpitations (heart racing today, occ feels palps - today was the first time since starting the BP meds). Negative for chest pain and leg swelling.  Neurological: Negative for dizziness, light-headedness and headaches.       Objective:   Vitals:   10/26/17 1419  BP: 112/90  Pulse: (!) 137  SpO2: 99%   BP Readings from Last 3 Encounters:  10/26/17 112/90  04/27/17 122/70  02/21/17 (!) 164/98   Wt Readings from Last 3  Encounters:  10/26/17 241 lb (109.3 kg)  04/27/17 229 lb (103.9 kg)  02/21/17 229 lb (103.9 kg)   Body mass index is 32.69 kg/m.   Physical Exam    Constitutional: Appears well-developed and well-nourished. No distress.  HENT:  Head: Normocephalic and atraumatic.  Neck: Neck supple. No tracheal deviation present. No thyromegaly present.  No cervical lymphadenopathy Cardiovascular: Tachycardia, regular rhythm and normal heart sounds.   No murmur heard. No carotid bruit .  No edema Pulmonary/Chest: Effort normal and breath sounds normal. No respiratory distress. No has no wheezes. No rales.  Skin: Skin is warm and dry. Not diaphoretic.  Psychiatric: Normal mood and affect. Behavior is normal.      Assessment & Plan:    See Problem List for Assessment and Plan of chronic medical problems.

## 2017-10-26 ENCOUNTER — Encounter: Payer: Self-pay | Admitting: Internal Medicine

## 2017-10-26 ENCOUNTER — Telehealth: Payer: Self-pay | Admitting: Cardiovascular Disease

## 2017-10-26 ENCOUNTER — Other Ambulatory Visit (INDEPENDENT_AMBULATORY_CARE_PROVIDER_SITE_OTHER): Payer: BLUE CROSS/BLUE SHIELD

## 2017-10-26 ENCOUNTER — Ambulatory Visit (INDEPENDENT_AMBULATORY_CARE_PROVIDER_SITE_OTHER): Payer: BLUE CROSS/BLUE SHIELD | Admitting: Internal Medicine

## 2017-10-26 VITALS — BP 112/90 | HR 137 | Ht 72.0 in | Wt 241.0 lb

## 2017-10-26 DIAGNOSIS — I10 Essential (primary) hypertension: Secondary | ICD-10-CM

## 2017-10-26 DIAGNOSIS — R000 Tachycardia, unspecified: Secondary | ICD-10-CM

## 2017-10-26 DIAGNOSIS — R739 Hyperglycemia, unspecified: Secondary | ICD-10-CM

## 2017-10-26 DIAGNOSIS — E7849 Other hyperlipidemia: Secondary | ICD-10-CM

## 2017-10-26 DIAGNOSIS — R002 Palpitations: Secondary | ICD-10-CM | POA: Diagnosis not present

## 2017-10-26 DIAGNOSIS — I4892 Unspecified atrial flutter: Secondary | ICD-10-CM | POA: Insufficient documentation

## 2017-10-26 DIAGNOSIS — R7303 Prediabetes: Secondary | ICD-10-CM

## 2017-10-26 HISTORY — DX: Palpitations: R00.2

## 2017-10-26 LAB — CBC WITH DIFFERENTIAL/PLATELET
Basophils Absolute: 0 10*3/uL (ref 0.0–0.1)
Basophils Relative: 0.6 % (ref 0.0–3.0)
Eosinophils Absolute: 0.2 10*3/uL (ref 0.0–0.7)
Eosinophils Relative: 3.7 % (ref 0.0–5.0)
HCT: 39.7 % (ref 39.0–52.0)
Hemoglobin: 13.3 g/dL (ref 13.0–17.0)
Lymphocytes Relative: 43.2 % (ref 12.0–46.0)
Lymphs Abs: 1.8 10*3/uL (ref 0.7–4.0)
MCHC: 33.4 g/dL (ref 30.0–36.0)
MCV: 85.7 fl (ref 78.0–100.0)
Monocytes Absolute: 0.4 10*3/uL (ref 0.1–1.0)
Monocytes Relative: 8.9 % (ref 3.0–12.0)
Neutro Abs: 1.8 10*3/uL (ref 1.4–7.7)
Neutrophils Relative %: 43.6 % (ref 43.0–77.0)
Platelets: 167 10*3/uL (ref 150.0–400.0)
RBC: 4.63 Mil/uL (ref 4.22–5.81)
RDW: 14 % (ref 11.5–15.5)
WBC: 4.1 10*3/uL (ref 4.0–10.5)

## 2017-10-26 LAB — COMPREHENSIVE METABOLIC PANEL
ALT: 20 U/L (ref 0–53)
AST: 20 U/L (ref 0–37)
Albumin: 4.4 g/dL (ref 3.5–5.2)
Alkaline Phosphatase: 53 U/L (ref 39–117)
BUN: 16 mg/dL (ref 6–23)
CO2: 27 mEq/L (ref 19–32)
Calcium: 9.4 mg/dL (ref 8.4–10.5)
Chloride: 106 mEq/L (ref 96–112)
Creatinine, Ser: 1.23 mg/dL (ref 0.40–1.50)
GFR: 78.52 mL/min (ref >60.00)
Glucose, Bld: 101 mg/dL — ABNORMAL HIGH (ref 70–99)
Potassium: 4.1 mEq/L (ref 3.5–5.1)
Sodium: 139 mEq/L (ref 135–145)
Total Bilirubin: 1 mg/dL (ref 0.2–1.2)
Total Protein: 7.3 g/dL (ref 6.0–8.3)

## 2017-10-26 LAB — HEMOGLOBIN A1C: Hgb A1c MFr Bld: 6.4 % (ref 4.6–6.5)

## 2017-10-26 LAB — LIPID PANEL
Cholesterol: 148 mg/dL (ref 0–200)
HDL: 44.8 mg/dL (ref >39.00)
LDL Cholesterol: 85 mg/dL (ref 0–99)
NonHDL: 102.72
Total CHOL/HDL Ratio: 3
Triglycerides: 88 mg/dL (ref 0.0–149.0)
VLDL: 17.6 mg/dL (ref 0.0–40.0)

## 2017-10-26 LAB — TSH: TSH: 3.57 u[IU]/mL (ref 0.35–4.50)

## 2017-10-26 MED ORDER — APIXABAN 5 MG PO TABS: 5.0000 mg | ORAL_TABLET | Freq: Two times a day (BID) | ORAL | 5 refills | Status: DC

## 2017-10-26 MED ORDER — METOPROLOL TARTRATE 25 MG PO TABS: 25.0000 mg | ORAL_TABLET | Freq: Two times a day (BID) | ORAL | 3 refills | Status: DC

## 2017-10-26 NOTE — Telephone Encounter (Addendum)
Dr. Johnsie Cancel talked to Dr. Quay Burow. Patient will be started on lopressor. Patient is to follow up with cardiologist this week. Dr. Johnsie Cancel can see patient tomorrow at 11:15. Left message for patient to call back to confirm appointment time and date.

## 2017-10-26 NOTE — Assessment & Plan Note (Signed)
Check lipid panel  Continue daily statin Regular exercise and healthy diet encouraged  

## 2017-10-26 NOTE — Patient Instructions (Addendum)
  Test(s) ordered today. Your results will be released to [place] (or called to you) after review, usually within 72hours after test completion. If any changes need to be made, you will be notified at that same time.  An EKG was done today   Medications reviewed and updated.  Changes include starting metoprolol 25 mg twice daily and eliquis 5 mg twice daily.   Your prescription(s) have been submitted to your pharmacy. Please take as directed and contact our office if you believe you are having problem(s) with the medication(s).   Please followup in 6 months

## 2017-10-26 NOTE — Assessment & Plan Note (Signed)
BP well controlled Current regimen effective and well tolerated Continue current medications at current doses Will add metoprolol for atrial flutter Advised to call if any side effects CMP

## 2017-10-26 NOTE — Assessment & Plan Note (Signed)
Heart rate when measured by CMA was 137 EKG today shows atrial flutter at 139, incomplete right bundle branch block, no prior for comparison He is asymptomatic Discussed with cardiology-they can get him in the next couple of days Start metoprolol 25 mg twice daily Start Eliquis 5 mg twice daily CMP, CBC, TSH

## 2017-10-26 NOTE — Assessment & Plan Note (Signed)
Lab Results  Component Value Date   HGBA1C 6.4 10/26/2017   Discussed that he has elevated sugars and is at risk of diabetes He has made some diet changes He is active at work, but not exercising regularly outside of work Continue low sugar/carbohydrate diet-continue to make diet changes Ideally work on weight loss Ideally increase activity level Monitor A1c every 6 months

## 2017-10-26 NOTE — Progress Notes (Deleted)
Cardiology Office Note   Date:  10/26/2017   ID:  Carlos Harrison, DOB 1960/10/16, MRN [id number]  PCP:  [name], MD  Cardiologist:   Jenkins Rouge, MD   No chief complaint on file.     History of Present Illness: Carlos Harrison is a 57 y.o. male who presents for consultation regarding PAF/Flutter Referred by Dr Quay Burow.  CRF; s include HTN, DM-2 , and HLD  When seen in her office 10/26/17 noted to be tachycardic He was essentially asymptomatic had been on norvasc and avapro for his BP. Spoke with Dr Quay Burow on phone and she indicated he was in afib/flutter rates 120-137 Currently no ECG in Epic Patient started on lopressor 25 bid and eliquis 5 bid and arranged to f/u with cardiology Labs from 10/26/17 reviewed normal including K / Cr A1c 6.4 Hct 39.7 PLT 167 Cr 1.23 TSH 3  This patients CHA2DS2-VASc Score and unadjusted Ischemic Stroke Rate (% per year) is equal to 0.6 % stroke rate/year from a score of 1  Above score calculated as 1 point each if present [CHF, HTN, DM, Vascular=MI/PAD/Aortic Plaque, Age if 65-74, or Male] Above score calculated as 2 points each if present [Age > 75, or Stroke/TIA/TE]  ***    No past medical history on file.  Past Surgical History:  Procedure Laterality Date  . EYE SURGERY Left      Current Outpatient Medications  Medication Sig Dispense Refill  . amLODipine (NORVASC) 5 MG tablet Take 1 tablet (5 mg total) by mouth daily. 90 tablet 3  . apixaban (ELIQUIS) 5 MG TABS tablet Take 1 tablet (5 mg total) by mouth 2 (two) times daily. 60 tablet 5  . irbesartan (AVAPRO) 150 MG tablet Take 1 tablet (150 mg total) by mouth daily. 30 tablet 6  . metoprolol tartrate (LOPRESSOR) 25 MG tablet Take 1 tablet (25 mg total) by mouth 2 (two) times daily. 180 tablet 3  . rosuvastatin (CRESTOR) 10 MG tablet TAKE 1 TABLET BY MOUTH EVERY DAY 90 tablet 1   No current facility-administered medications for this visit.     Allergies:   Patient has no known  allergies.    Social History:  The patient  reports that he has never smoked. He has never used smokeless tobacco. He reports that he does not drink alcohol or use drugs.   Family History:  The patient's family history includes Ovarian cancer in his mother.    ROS:  Please see the history of present illness.   Otherwise, review of systems are positive for none.   All other systems are reviewed and negative.    PHYSICAL EXAM: VS:  There were no vitals taken for this visit. , BMI There is no height or weight on file to calculate BMI. Affect appropriate Healthy:  appears stated age 89: normal Neck supple with no adenopathy JVP normal no bruits no thyromegaly Lungs clear with no wheezing and good diaphragmatic motion Heart:  S1/S2 no murmur, no rub, gallop or click PMI normal Abdomen: benighn, BS positve, no tenderness, no AAA no bruit.  No HSM or HJR Distal pulses intact with no bruits No edema Neuro non-focal Skin warm and dry No muscular weakness    EKG:  10/26/17 Flutter rate 139  Otherwise normal no pre excitation    Recent Labs: 10/26/2017: ALT 20; BUN 16; Creatinine, Ser 1.23; Hemoglobin 13.3; Platelets 167.0; Potassium 4.1; Sodium 139; TSH 3.57    Lipid Panel  Component Value Date/Time   CHOL 148 10/26/2017 1541   TRIG 88.0 10/26/2017 1541   HDL 44.80 10/26/2017 1541   CHOLHDL 3 10/26/2017 1541   VLDL 17.6 10/26/2017 1541   LDLCALC 85 10/26/2017 1541      Wt Readings from Last 3 Encounters:  10/26/17 241 lb (109.3 kg)  04/27/17 229 lb (103.9 kg)  02/21/17 229 lb (103.9 kg)      Other studies Reviewed: Additional studies/ records that were reviewed today include: Notes from Dr Quay Burow labs And ECG .    ASSESSMENT AND PLAN:  1.  Atrial Flutter 2.  HTN 3. HLD 4.  DM 5. ***   Current medicines are reviewed at length with the patient today.  The patient does not have concerns regarding medicines.  The following changes have been made:   ***  Labs/ tests ordered today include: TTE  No orders of the defined types were placed in this encounter.    Disposition:   FU with me in 6 weeks      Signed, Jenkins Rouge, MD  10/26/2017 5:11 PM    [facility] [address], [place], [place]  [address] Phone: [phone]; Fax: [phone]

## 2017-10-26 NOTE — Telephone Encounter (Signed)
New Message    Dr. Celso Amy calling to speak with the DOD wanting to consult about options for the pt having onset Aflutter

## 2017-10-26 NOTE — Telephone Encounter (Signed)
Left message for patient to call back  

## 2017-10-26 NOTE — Assessment & Plan Note (Signed)
Tachycardic on exam, asymptomatic EKG today shows atrial flutter at 139, incomplete right bundle branch block, no prior for comparison Discussed with cardiology-they can get him in the next couple of days Start metoprolol 25 mg twice daily Start Eliquis 5 mg twice daily CMP, CBC, TSH

## 2017-10-27 ENCOUNTER — Ambulatory Visit: Payer: BLUE CROSS/BLUE SHIELD | Admitting: Cardiovascular Disease

## 2017-10-27 ENCOUNTER — Encounter: Payer: Self-pay | Admitting: Internal Medicine

## 2017-10-27 DIAGNOSIS — I4891 Unspecified atrial fibrillation: Secondary | ICD-10-CM

## 2017-10-27 HISTORY — DX: Unspecified atrial fibrillation: I48.91

## 2017-10-27 NOTE — Telephone Encounter (Signed)
Called patient back this morning. Patient will have to come in next week due to schedule conflict. Made patient an appointment with Dr. Johnsie Cancel on 11/02/17. Patient verbalized understanding.

## 2017-10-28 ENCOUNTER — Encounter: Payer: Self-pay | Admitting: *Deleted

## 2017-11-01 NOTE — Progress Notes (Signed)
Cardiology Office Note   Date:  11/02/2017   ID:  Carlos Harrison, DOB 04-07-1960, MRN [id number]  PCP:  [name], MD  Cardiologist:   Jenkins Rouge, MD   No chief complaint on file.     History of Present Illness: Carlos Harrison is a 57 y.o. male who presents for consultation regarding new onset afib. Referred by Dr Quay Burow.  Reviewed her office note and spoke to her personally on 10/26/17  CRF;s HTN, DM and HLD Occasional palpitations and tachycardic in office Was on norvasc and avapro for his BP    This patients CHA2DS2-VASc Score and unadjusted Ischemic Stroke Rate (% per year) is equal to 2.2 % stroke rate/year from a score of 2  Above score calculated as 1 point each if present [CHF, HTN, DM, Vascular=MI/PAD/Aortic Plaque, Age if 65-74, or Male] Above score calculated as 2 points each if present [Age > 75, or Stroke/TIA/TE]  Labs reviewed and A1c 6.4 normal CBC/Cr LDL 85  Started on eliquis and lopressor 25 bid  Long discussion with family and patient about diagnosis of afib need for anticoagulation and rate control with preferred initial strategy of conversion in young patient   Past Medical History:  Diagnosis Date  . Palpitations 10/26/2017    Past Surgical History:  Procedure Laterality Date  . EYE SURGERY Left      Current Outpatient Medications  Medication Sig Dispense Refill  . amLODipine (NORVASC) 5 MG tablet Take 1 tablet (5 mg total) by mouth daily. 90 tablet 3  . apixaban (ELIQUIS) 5 MG TABS tablet Take 1 tablet (5 mg total) by mouth 2 (two) times daily. 60 tablet 5  . irbesartan (AVAPRO) 150 MG tablet Take 1 tablet (150 mg total) by mouth daily. 30 tablet 6  . metoprolol tartrate (LOPRESSOR) 25 MG tablet Take 1 tablet (25 mg total) by mouth 2 (two) times daily. 180 tablet 3  . rosuvastatin (CRESTOR) 10 MG tablet TAKE 1 TABLET BY MOUTH EVERY DAY 90 tablet 1   No current facility-administered medications for this visit.     Allergies:   Patient  has no known allergies.    Social History:  The patient  reports that he has never smoked. He has never used smokeless tobacco. He reports that he does not drink alcohol or use drugs.   Family History:  The patient's family history includes Ovarian cancer in his mother.    ROS:  Please see the history of present illness.   Otherwise, review of systems are positive for none.   All other systems are reviewed and negative.    PHYSICAL EXAM: VS:  BP (!) 130/92   Pulse 91   Ht 6' (1.829 m)   Wt 241 lb 8 oz (109.5 kg)   SpO2 99%   BMI 32.75 kg/m  , BMI Body mass index is 32.75 kg/m. Affect appropriate Healthy:  appears stated age 5: normal Neck supple with no adenopathy JVP normal no bruits no thyromegaly Lungs clear with no wheezing and good diaphragmatic motion Heart:  S1/S2 no murmur, no rub, gallop or click PMI normal Abdomen: benighn, BS positve, no tenderness, no AAA no bruit.  No HSM or HJR Distal pulses intact with no bruits No edema Neuro non-focal Skin warm and dry No muscular weakness    EKG:  Flutter rate 139 no pre excitation    Recent Labs: 10/26/2017: ALT 20; BUN 16; Creatinine, Ser 1.23; Hemoglobin 13.3; Platelets 167.0; Potassium 4.1; Sodium 139;  TSH 3.57    Lipid Panel    Component Value Date/Time   CHOL 148 10/26/2017 1541   TRIG 88.0 10/26/2017 1541   HDL 44.80 10/26/2017 1541   CHOLHDL 3 10/26/2017 1541   VLDL 17.6 10/26/2017 1541   LDLCALC 85 10/26/2017 1541      Wt Readings from Last 3 Encounters:  11/02/17 241 lb 8 oz (109.5 kg)  10/26/17 241 lb (109.3 kg)  04/27/17 229 lb (103.9 kg)      Other studies Reviewed: Additional studies/ records that were reviewed today include: Notes from primary ECG labs .    ASSESSMENT AND PLAN:  1.  Atrial flutter Stop norvasc increase lopressor 50 bid for better rate Control on Eliquis but insurance does not cover so he will inquire about Pradaxa or Xarelto.  Set up [facility] for 11/24/17 Risk of  stroke intubation and pacing discussed willing to proceed.   2. HTN:  Bit high Norvasc d/c lopressor increased   3. HLD:  Continue statin   4. DM:  Discussed low carb diet.  Target hemoglobin A1c is 6.5 or less.  Continue current medications.    Current medicines are reviewed at length with the patient today.  The patient does not have concerns regarding medicines.  The following changes have been made:  Lopressor 50 bid d/c norvasc   Labs/ tests ordered today include: TTE  And [facility] scheduled 11/24/17 No orders of the defined types were placed in this encounter.    Disposition:   FU with cardiology post [facility]      Signed, Jenkins Rouge, MD  11/02/2017 2:52 PM    [address], [place], [place]  [address] Phone: [phone]; Fax: [phone]

## 2017-11-02 ENCOUNTER — Ambulatory Visit (INDEPENDENT_AMBULATORY_CARE_PROVIDER_SITE_OTHER): Payer: BLUE CROSS/BLUE SHIELD | Admitting: Cardiovascular Disease

## 2017-11-02 ENCOUNTER — Encounter: Payer: Self-pay | Admitting: Cardiovascular Disease

## 2017-11-02 ENCOUNTER — Other Ambulatory Visit (HOSPITAL_COMMUNITY): Payer: Self-pay | Admitting: Cardiovascular Disease

## 2017-11-02 VITALS — BP 130/92 | HR 91 | Ht 72.0 in | Wt 241.5 lb

## 2017-11-02 DIAGNOSIS — I4819 Other persistent atrial fibrillation: Secondary | ICD-10-CM

## 2017-11-02 DIAGNOSIS — I481 Persistent atrial fibrillation: Secondary | ICD-10-CM

## 2017-11-02 MED ORDER — METOPROLOL TARTRATE 50 MG PO TABS: 50.0000 mg | ORAL_TABLET | Freq: Two times a day (BID) | ORAL | 3 refills | Status: DC

## 2017-11-02 NOTE — Patient Instructions (Signed)
Medication Instructions:  Your physician has recommended you make the following change in your medication: 1-STOP Norvasc 2-INCREASE Metoprolol 50 mg by mouth twice daily.  Labwork: Your physician recommends that you return for lab work on 11/22/17 for  BMET and CBC  Testing/Procedures: Your physician has requested that you have an echocardiogram. Echocardiography is a painless test that uses sound waves to create images of your heart. It provides your doctor with information about the size and shape of your heart and how well your heart's chambers and valves are working. This procedure takes approximately one hour. There are no restrictions for this procedure.  Your physician has recommended that you have a Cardioversion (DCCV). Electrical Cardioversion uses a jolt of electricity to your heart either through paddles or wired patches attached to your chest. This is a controlled, usually prescheduled, procedure. Defibrillation is done under light anesthesia in the hospital, and you usually go home the day of the procedure. This is done to get your heart back into a normal rhythm. You are not awake for the procedure. Please see the instruction sheet given to you today.  Follow-Up: Your physician wants you to follow-up next available after 11/24/17 with Dr. Johnsie Cancel.    If you need a refill on your cardiac medications before your next appointment, please call your pharmacy.

## 2017-11-04 ENCOUNTER — Telehealth: Payer: Self-pay | Admitting: *Deleted

## 2017-11-04 MED ORDER — TELMISARTAN 40 MG PO TABS: 40.0000 mg | ORAL_TABLET | Freq: Every day | ORAL | 3 refills | Status: DC

## 2017-11-04 NOTE — Telephone Encounter (Signed)
MD is out of the office pls advise below.Marland Kitchenlmb  Copied from [place] [id number]. Topic: General - Other >> Nov 04, 2017  1:30 PM Oneta Rack wrote: Relation to pt: self  Call back number: [phone] Pharmacy: [id number] - [place], [place] [phone] (Phone) [phone] (Fax)  Reason for call:  Patient states pharmacy advised irbesartan (AVAPRO) 150 MG tablet is currently on back order, patient requesting alternate due to the fact he's completely out, please advise

## 2017-11-04 NOTE — Telephone Encounter (Signed)
Notified pt w/MD response.../lmb 

## 2017-11-04 NOTE — Telephone Encounter (Signed)
[place] for change to micardis 40 qd - done erx

## 2017-11-08 NOTE — Telephone Encounter (Signed)
Call patient-advised that he call his insurance company and see if Pradaxa or Xarelto are covered.  This is what cardiology recommended.  If neither of these is covered we may need to consider warfarin.  Okay to give some samples of Eliquis until we can figure this out.

## 2017-11-08 NOTE — Telephone Encounter (Signed)
Copied from [place] [phone]. Topic: General - Other >> Nov 08, 2017  1:30 PM Alfredia Ferguson R wrote: Pt called in and stated that his insurance will not cover apixaban (ELIQUIS) 5 MG TABS tablet due to him not meeting his deductible. He wants to know if there is anything else that can be given that will cover his insurance so that he will be able to receive the meds ?

## 2017-11-09 ENCOUNTER — Telehealth: Payer: Self-pay | Admitting: Internal Medicine

## 2017-11-09 NOTE — Telephone Encounter (Signed)
Spoke with pt. He is going to [facility] and pick up samples of Eliquis

## 2017-11-09 NOTE — Telephone Encounter (Signed)
Copied from [place] [id number]. Topic: General - Other >> Nov 08, 2017  1:30 PM Alfredia Ferguson R wrote: Pt called in and stated that his insurance will not cover apixaban (ELIQUIS) 5 MG TABS tablet due to him not meeting his deductible. He wants to know if there is anything else that can be given that will cover his insurance so that he will be able to receive the meds ? >> Nov 09, 2017  4:36 PM Bea Graff, NT wrote: Pt calling to check status and to see if samples are available of the Eliquis as well.

## 2017-11-10 ENCOUNTER — Other Ambulatory Visit: Payer: Self-pay

## 2017-11-10 ENCOUNTER — Ambulatory Visit (HOSPITAL_COMMUNITY): Payer: BLUE CROSS/BLUE SHIELD | Attending: Cardiology

## 2017-11-10 DIAGNOSIS — I481 Persistent atrial fibrillation: Secondary | ICD-10-CM | POA: Diagnosis present

## 2017-11-10 DIAGNOSIS — I429 Cardiomyopathy, unspecified: Secondary | ICD-10-CM

## 2017-11-10 DIAGNOSIS — I4819 Other persistent atrial fibrillation: Secondary | ICD-10-CM

## 2017-11-10 DIAGNOSIS — R000 Tachycardia, unspecified: Secondary | ICD-10-CM | POA: Insufficient documentation

## 2017-11-10 DIAGNOSIS — E785 Hyperlipidemia, unspecified: Secondary | ICD-10-CM | POA: Insufficient documentation

## 2017-11-10 DIAGNOSIS — I10 Essential (primary) hypertension: Secondary | ICD-10-CM | POA: Insufficient documentation

## 2017-11-10 DIAGNOSIS — R7303 Prediabetes: Secondary | ICD-10-CM | POA: Diagnosis not present

## 2017-11-10 DIAGNOSIS — I081 Rheumatic disorders of both mitral and tricuspid valves: Secondary | ICD-10-CM | POA: Insufficient documentation

## 2017-11-10 HISTORY — DX: Cardiomyopathy, unspecified: I42.9

## 2017-11-10 MED ORDER — PERFLUTREN LIPID MICROSPHERE
1.0000 mL | INTRAVENOUS | Status: AC | PRN
  Administered 2017-11-10: 1 mL via INTRAVENOUS

## 2017-11-10 NOTE — Addendum Note (Signed)
Addended by: [name] on: 11/10/2017 04:45 PM   Modules accepted: Orders

## 2017-11-10 NOTE — Telephone Encounter (Signed)
Once he finishes the sample he can start xarelto.      rx pending.

## 2017-11-10 NOTE — Telephone Encounter (Signed)
Pt came in and picked up Eliquis and states Xarelto is covered with his insurance.  Please advise

## 2017-11-11 MED ORDER — RIVAROXABAN 20 MG PO TABS: 20.0000 mg | ORAL_TABLET | Freq: Every day | ORAL | 5 refills | Status: DC

## 2017-11-11 NOTE — Telephone Encounter (Signed)
Spoke with pt's wife to inform.  

## 2017-11-11 NOTE — Addendum Note (Signed)
Addended by: Terence Lux B on: 11/11/2017 09:32 AM   Modules accepted: Orders

## 2017-11-15 ENCOUNTER — Ambulatory Visit: Payer: BLUE CROSS/BLUE SHIELD | Admitting: Nurse Practitioner

## 2017-11-15 ENCOUNTER — Observation Stay (HOSPITAL_COMMUNITY)
Admission: EM | Admit: 2017-11-15 | Discharge: 2017-11-17 | Disposition: A | Discharge status: Home / Self Care | Payer: BLUE CROSS/BLUE SHIELD | Attending: Cardiovascular Disease | Admitting: Cardiovascular Disease

## 2017-11-15 ENCOUNTER — Telehealth: Payer: Self-pay | Admitting: Internal Medicine

## 2017-11-15 ENCOUNTER — Emergency Department (HOSPITAL_COMMUNITY): Payer: BLUE CROSS/BLUE SHIELD

## 2017-11-15 ENCOUNTER — Encounter: Payer: Self-pay | Admitting: Nurse Practitioner

## 2017-11-15 ENCOUNTER — Other Ambulatory Visit: Payer: Self-pay

## 2017-11-15 ENCOUNTER — Encounter (HOSPITAL_COMMUNITY): Payer: Self-pay | Admitting: Emergency Medicine

## 2017-11-15 VITALS — BP 106/90 | HR 134 | Temp 97.5°F | Ht 72.0 in | Wt 240.0 lb

## 2017-11-15 DIAGNOSIS — I5021 Acute systolic (congestive) heart failure: Secondary | ICD-10-CM | POA: Diagnosis not present

## 2017-11-15 DIAGNOSIS — I4892 Unspecified atrial flutter: Principal | ICD-10-CM | POA: Diagnosis present

## 2017-11-15 DIAGNOSIS — N182 Chronic kidney disease, stage 2 (mild): Secondary | ICD-10-CM | POA: Diagnosis present

## 2017-11-15 DIAGNOSIS — R531 Weakness: Secondary | ICD-10-CM

## 2017-11-15 DIAGNOSIS — E1169 Type 2 diabetes mellitus with other specified complication: Secondary | ICD-10-CM | POA: Diagnosis present

## 2017-11-15 DIAGNOSIS — I152 Hypertension secondary to endocrine disorders: Secondary | ICD-10-CM | POA: Diagnosis present

## 2017-11-15 DIAGNOSIS — Z7901 Long term (current) use of anticoagulants: Secondary | ICD-10-CM | POA: Diagnosis not present

## 2017-11-15 DIAGNOSIS — E785 Hyperlipidemia, unspecified: Secondary | ICD-10-CM | POA: Diagnosis not present

## 2017-11-15 DIAGNOSIS — R945 Abnormal results of liver function studies: Secondary | ICD-10-CM

## 2017-11-15 DIAGNOSIS — I481 Persistent atrial fibrillation: Secondary | ICD-10-CM | POA: Diagnosis not present

## 2017-11-15 DIAGNOSIS — R748 Abnormal levels of other serum enzymes: Secondary | ICD-10-CM | POA: Diagnosis not present

## 2017-11-15 DIAGNOSIS — I5022 Chronic systolic (congestive) heart failure: Secondary | ICD-10-CM | POA: Insufficient documentation

## 2017-11-15 DIAGNOSIS — R002 Palpitations: Secondary | ICD-10-CM | POA: Diagnosis not present

## 2017-11-15 DIAGNOSIS — R0602 Shortness of breath: Secondary | ICD-10-CM

## 2017-11-15 DIAGNOSIS — R7989 Other specified abnormal findings of blood chemistry: Secondary | ICD-10-CM

## 2017-11-15 DIAGNOSIS — I130 Hypertensive heart and chronic kidney disease with heart failure and stage 1 through stage 4 chronic kidney disease, or unspecified chronic kidney disease: Secondary | ICD-10-CM | POA: Insufficient documentation

## 2017-11-15 DIAGNOSIS — I4819 Other persistent atrial fibrillation: Secondary | ICD-10-CM

## 2017-11-15 DIAGNOSIS — R112 Nausea with vomiting, unspecified: Secondary | ICD-10-CM | POA: Diagnosis not present

## 2017-11-15 DIAGNOSIS — I051 Rheumatic mitral insufficiency: Secondary | ICD-10-CM | POA: Diagnosis not present

## 2017-11-15 DIAGNOSIS — I10 Essential (primary) hypertension: Secondary | ICD-10-CM | POA: Diagnosis present

## 2017-11-15 DIAGNOSIS — Z9889 Other specified postprocedural states: Secondary | ICD-10-CM | POA: Insufficient documentation

## 2017-11-15 DIAGNOSIS — R000 Tachycardia, unspecified: Secondary | ICD-10-CM

## 2017-11-15 DIAGNOSIS — Z79899 Other long term (current) drug therapy: Secondary | ICD-10-CM | POA: Insufficient documentation

## 2017-11-15 DIAGNOSIS — I5023 Acute on chronic systolic (congestive) heart failure: Secondary | ICD-10-CM | POA: Diagnosis not present

## 2017-11-15 HISTORY — DX: Essential (primary) hypertension: I10

## 2017-11-15 HISTORY — DX: Dyspnea, unspecified: R06.00

## 2017-11-15 HISTORY — DX: Cardiomyopathy, unspecified: I42.9

## 2017-11-15 HISTORY — DX: Unspecified atrial fibrillation: I48.91

## 2017-11-15 LAB — URINALYSIS, ROUTINE W REFLEX MICROSCOPIC
Bilirubin Urine: NEGATIVE
Glucose, UA: NEGATIVE mg/dL
Hgb urine dipstick: NEGATIVE
Ketones, ur: 20 mg/dL — AB
Leukocytes, UA: NEGATIVE
Nitrite: NEGATIVE
Protein, ur: 30 mg/dL — AB
Specific Gravity, Urine: 1.028 (ref 1.005–1.030)
pH: 5 (ref 5.0–8.0)

## 2017-11-15 LAB — BRAIN NATRIURETIC PEPTIDE: B Natriuretic Peptide: 640.2 pg/mL — ABNORMAL HIGH (ref 0.0–100.0)

## 2017-11-15 LAB — CBC
HCT: 42.2 % (ref 39.0–52.0)
Hemoglobin: 13.4 g/dL (ref 13.0–17.0)
MCH: 28.2 pg (ref 26.0–34.0)
MCHC: 31.8 g/dL (ref 30.0–36.0)
MCV: 88.7 fL (ref 78.0–100.0)
Platelets: 260 10*3/uL (ref 150–400)
RBC: 4.76 MIL/uL (ref 4.22–5.81)
RDW: 14.4 % (ref 11.5–15.5)
WBC: 5.8 10*3/uL (ref 4.0–10.5)

## 2017-11-15 LAB — BASIC METABOLIC PANEL
Anion gap: 11 (ref 5–15)
BUN: 27 mg/dL — ABNORMAL HIGH (ref 6–20)
CO2: 21 mmol/L — ABNORMAL LOW (ref 22–32)
Calcium: 9 mg/dL (ref 8.9–10.3)
Chloride: 107 mmol/L (ref 98–111)
Creatinine, Ser: 1.36 mg/dL — ABNORMAL HIGH (ref 0.61–1.24)
GFR calc Af Amer: >60 mL/min (ref >60)
GFR calc non Af Amer: 57 mL/min — ABNORMAL LOW (ref >60)
Glucose, Bld: 107 mg/dL — ABNORMAL HIGH (ref 70–99)
Potassium: 4.2 mmol/L (ref 3.5–5.1)
Sodium: 139 mmol/L (ref 135–145)

## 2017-11-15 LAB — I-STAT TROPONIN, ED: Troponin i, poc: 0 ng/mL (ref 0.00–0.08)

## 2017-11-15 MED ORDER — ROSUVASTATIN CALCIUM 10 MG PO TABS
10.0000 mg | ORAL_TABLET | Freq: Every day | ORAL | Status: DC
  Administered 2017-11-16 – 2017-11-17 (×2): 10 mg via ORAL
  Filled 2017-11-15 (×2): qty 1

## 2017-11-15 MED ORDER — IOPAMIDOL (ISOVUE-370) INJECTION 76%
100.0000 mL | Freq: Once | INTRAVENOUS | Status: AC | PRN
  Administered 2017-11-15: 100 mL via INTRAVENOUS

## 2017-11-15 MED ORDER — ACETAMINOPHEN 325 MG PO TABS
650.0000 mg | ORAL_TABLET | Freq: Four times a day (QID) | ORAL | Status: DC | PRN
  Administered 2017-11-17: 650 mg via ORAL
  Filled 2017-11-15: qty 2

## 2017-11-15 MED ORDER — LEVALBUTEROL HCL 1.25 MG/0.5ML IN NEBU
1.2500 mg | INHALATION_SOLUTION | Freq: Four times a day (QID) | RESPIRATORY_TRACT | Status: DC
  Administered 2017-11-16 (×2): 1.25 mg via RESPIRATORY_TRACT
  Filled 2017-11-15 (×4): qty 0.5

## 2017-11-15 MED ORDER — HYDRALAZINE HCL 20 MG/ML IJ SOLN: 5.0000 mg | INTRAMUSCULAR | Status: DC | PRN

## 2017-11-15 MED ORDER — ONDANSETRON HCL 4 MG PO TABS: 4.0000 mg | ORAL_TABLET | Freq: Four times a day (QID) | ORAL | Status: DC | PRN

## 2017-11-15 MED ORDER — RIVAROXABAN 20 MG PO TABS
20.0000 mg | ORAL_TABLET | Freq: Every day | ORAL | Status: DC
  Administered 2017-11-16 (×2): 20 mg via ORAL
  Filled 2017-11-15 (×2): qty 1

## 2017-11-15 MED ORDER — IOPAMIDOL (ISOVUE-370) INJECTION 76%
INTRAVENOUS | Status: AC
  Filled 2017-11-15: qty 100

## 2017-11-15 MED ORDER — ONDANSETRON HCL 4 MG/2ML IJ SOLN: 4.0000 mg | Freq: Four times a day (QID) | INTRAMUSCULAR | Status: DC | PRN

## 2017-11-15 MED ORDER — ACETAMINOPHEN 650 MG RE SUPP: 650.0000 mg | Freq: Four times a day (QID) | RECTAL | Status: DC | PRN

## 2017-11-15 MED ORDER — AMLODIPINE BESYLATE 5 MG PO TABS
5.0000 mg | ORAL_TABLET | Freq: Every day | ORAL | Status: DC
  Administered 2017-11-16: 5 mg via ORAL
  Filled 2017-11-15: qty 1

## 2017-11-15 MED ORDER — ZOLPIDEM TARTRATE 5 MG PO TABS: 5.0000 mg | ORAL_TABLET | Freq: Every evening | ORAL | Status: DC | PRN

## 2017-11-15 MED ORDER — METOPROLOL TARTRATE 25 MG PO TABS
50.0000 mg | ORAL_TABLET | Freq: Two times a day (BID) | ORAL | Status: DC
  Administered 2017-11-16 (×2): 50 mg via ORAL
  Administered 2017-11-17: 10:00:00 25 mg via ORAL
  Filled 2017-11-15 (×3): qty 2

## 2017-11-15 NOTE — ED Triage Notes (Signed)
Pt from PCP for cardiac evaluation. Pt reports feeling weak and tired recently, reports intermittent SHOB. Pt had 1 episode of vomiting today. Pt denies CP. Pt denies cardiac history.

## 2017-11-15 NOTE — Telephone Encounter (Signed)
Copied from [place] [phone]. Topic: Quick Communication - See Telephone Encounter >> Nov 15, 2017  1:41 PM Bea Graff, NT wrote: CRM for notification. See Telephone encounter for: 11/15/17. Pt would like to see if he can be taken off the telmisartan (MICARDIS) 40 MG tablet due to side effects he is having. He is complaining of upset stomach and sweating. Requesting to speak with a nurse. Also scheduled an appt for him tomorrow.

## 2017-11-15 NOTE — Progress Notes (Signed)
Subjective:  Patient ID: Carlos Harrison, male    DOB: Apr 23, 1960  Age: 57 y.o. MRN: [id number]  CC: Fatigue (patient is complaining of weakness,vomitting. this has been going for 1 wk and a half. pt stated he just started new med--micardis. )   accompanied by wife and daughter  GI Problem  The primary symptoms include fatigue, abdominal pain, nausea and vomiting. Primary symptoms do not include fever, diarrhea, melena, hematemesis, jaundice, hematochezia or dysuria. The illness began more than 7 days ago. The onset was gradual. The problem has been gradually worsening.  The illness is also significant for anorexia. The illness does not include chills, dysphagia, odynophagia, bloating, constipation, tenesmus, back pain or itching. Associated medical issues do not include GERD.  onset of symptoms 1.5week ago. Dyspnea with exertion Emesis today (clear), last BM today (normal per patient) avapro was changed to micardis 11days. Metoprolol and xarelto started 2weeks ago. Newly diagnosed A. Fib with RVR 2weeks ago. Echocardiogram done 11/10/2017: EF 10-15% with diffuse hypokinesis and concentric hypokinesis.  Reviewed past Medical, Social and Family history today.  Outpatient Medications Prior to Visit  Medication Sig Dispense Refill  . metoprolol tartrate (LOPRESSOR) 50 MG tablet Take 1 tablet (50 mg total) by mouth 2 (two) times daily. 180 tablet 3  . rivaroxaban (XARELTO) 20 MG TABS tablet Take 1 tablet (20 mg total) by mouth daily with supper. 30 tablet 5  . rosuvastatin (CRESTOR) 10 MG tablet TAKE 1 TABLET BY MOUTH EVERY DAY 90 tablet 1  . telmisartan (MICARDIS) 40 MG tablet Take 1 tablet (40 mg total) by mouth daily. 90 tablet 3   No facility-administered medications prior to visit.     ROS Review of Systems  Constitutional: Positive for fatigue and malaise/fatigue. Negative for chills and fever.  Respiratory: Positive for shortness of breath. Negative for cough.   Cardiovascular:  Positive for palpitations. Negative for chest pain, orthopnea and leg swelling.  Gastrointestinal: Positive for abdominal pain, anorexia, nausea and vomiting. Negative for bloating, constipation, diarrhea, dysphagia, hematemesis, hematochezia, jaundice and melena.  Genitourinary: Negative for dysuria.  Musculoskeletal: Negative for back pain.  Skin: Negative for itching.  Neurological: Negative for dizziness.   Objective:  BP 106/90   Pulse (!) 134   Temp (!) 97.5 F (36.4 C) (Oral)   Ht 6' (1.829 m)   Wt 240 lb (108.9 kg)   SpO2 98%   BMI 32.55 kg/m   BP Readings from Last 3 Encounters:  11/15/17 106/90  11/02/17 (!) 130/92  10/26/17 112/90    Wt Readings from Last 3 Encounters:  11/15/17 240 lb (108.9 kg)  11/02/17 241 lb 8 oz (109.5 kg)  10/26/17 241 lb (109.3 kg)    Physical Exam  Constitutional: He is oriented to person, place, and time. No distress.  Neck: No JVD present.  Cardiovascular: Normal rate.  Pulmonary/Chest: Effort normal and breath sounds normal.  Abdominal: Soft. Bowel sounds are normal. He exhibits no distension. There is no tenderness.  Musculoskeletal: He exhibits no edema.  Neurological: He is alert and oriented to person, place, and time.  Skin: Skin is warm and dry.  Vitals reviewed.   Lab Results  Component Value Date   WBC 4.1 10/26/2017   HGB 13.3 10/26/2017   HCT 39.7 10/26/2017   PLT 167.0 10/26/2017   GLUCOSE 101 (H) 10/26/2017   CHOL 148 10/26/2017   TRIG 88.0 10/26/2017   HDL 44.80 10/26/2017   LDLCALC 85 10/26/2017   ALT 20 10/26/2017  AST 20 10/26/2017   NA 139 10/26/2017   K 4.1 10/26/2017   CL 106 10/26/2017   CREATININE 1.23 10/26/2017   BUN 16 10/26/2017   CO2 27 10/26/2017   TSH 3.57 10/26/2017   PSA 0.96 06/02/2015   HGBA1C 6.4 10/26/2017    Assessment & Plan:  Collaborated with Dr. Tamala Julian with [facility] He recommended for patient to go to [facility] to rule out hepatic congestion vs  ischemia. Carlos Harrison and wife agreed to go to hospital as recommended.  Carlos Harrison was seen today for fatigue.  Diagnoses and all orders for this visit:  Atrial fibrillation, persistent (HCC)  Nausea and vomiting, intractability of vomiting not specified, unspecified vomiting type  Weakness generalized   I am having Carlos Harrison maintain his rosuvastatin, metoprolol tartrate, telmisartan, and rivaroxaban.  No orders of the defined types were placed in this encounter.   Follow-up: No follow-ups on file.  Wilfred Lacy, NP

## 2017-11-15 NOTE — ED Notes (Signed)
IV attempt x2.

## 2017-11-15 NOTE — ED Notes (Signed)
IV team at bedside 

## 2017-11-15 NOTE — Patient Instructions (Addendum)
Collaborated with Dr. Tamala Julian with [facility] via Telephone. He recommended for patient to go to [facility] to rule out hepatic congestion vs ischemia. Carlos Harrison and wife agreed to go to hospital as recommended.

## 2017-11-15 NOTE — Consult Note (Addendum)
Cardiology Consultation:   Patient ID: Carlos Harrison; [id number]; June 04, 1960   Admit date: 11/15/2017 Date of Consult: 11/15/2017  Primary Care Provider: [name], MD Primary Cardiologist: Josue Hector, MD  Chief Complaint: Nausea   Patient Profile:   Carlos Harrison is a 57 y.o. male with a history of recently diagnosed cardiomyopathy LVEF=10-15%, atrial fibrillation/flutter on rivaroxaban, hypertension, pre-diabetes mellitus, who is being seen today for the evaluation of atrial flutter at the request of the admitting general medicine team.    History of Present Illness:   Carlos Harrison is a 57 year old gentlemen with recently diagnosed cardiomyopathy LVEF=10-15%, hypertension, hyperlipidemia, pre-diabetes mellitus who presents with nausea and vomiting.   He reports the development of nausea today that occurred while working. This lasted approximately 1 minute. This was followed by three or four episodes of emesis. The nausea was accompanied by significant weakness over the past ferw days.   He was recently evaluated by Dr. Johnsie Cancel for evaluation of newly diagnosed atrial flutter. TTE demonstrated LVEF 10-15% (no prior). He was placed on apixiban (currently on samples) and missed his morning dose today and one other dose earlier this week in addition to metoprolol 50 mg twice daily. He was then started on telmisartan on 8/9 and is worried that his nausea may be secondary to this.   On arrival to the emergency department here, sCr 1.37 (increased from 1.23). Brain natriuretic peptide 640. WBC 5.8 Hgb 13.4 Plt 260.   Past Medical History:  Diagnosis Date  . Palpitations 10/26/2017    Past Surgical History:  Procedure Laterality Date  . EYE SURGERY Left      Inpatient Medications: Scheduled Meds: . amLODipine  5 mg Oral Daily  . iopamidol      . [START ON 11/16/2017] levalbuterol  1.25 mg Nebulization Q6H  . metoprolol tartrate  50 mg Oral BID  . rivaroxaban  20 mg  Oral Q supper  . [START ON 11/16/2017] rosuvastatin  10 mg Oral Daily   Continuous Infusions:  PRN Meds: acetaminophen **OR** acetaminophen, hydrALAZINE, ondansetron **OR** ondansetron (ZOFRAN) IV, zolpidem  Home Meds: Prior to Admission medications   Medication Sig Start Date End Date Taking? Authorizing Provider  apixaban (ELIQUIS) 5 MG TABS tablet Take 5 mg by mouth 2 (two) times daily.   Yes [provider]  metoprolol tartrate (LOPRESSOR) 50 MG tablet Take 1 tablet (50 mg total) by mouth 2 (two) times daily. 11/02/17  Yes Josue Hector, MD  rosuvastatin (CRESTOR) 10 MG tablet TAKE 1 TABLET BY MOUTH EVERY DAY 10/24/17  Yes Burns, Claudina Lick, MD  telmisartan (MICARDIS) 40 MG tablet Take 1 tablet (40 mg total) by mouth daily. 11/04/17  Yes Biagio Borg, MD  rivaroxaban (XARELTO) 20 MG TABS tablet Take 1 tablet (20 mg total) by mouth daily with supper. 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 He does not smoke and has never smoked. He uses no illicit substances.   Family History:   The patient's family history includes Ovarian cancer in his mother.   He has two brothers who died early from heart disease. He does not know the precise etiology of their demise but it was related to the heart. His mother and father did not have heart issues. No one in his family has a pacemaker or defibrillator.  ROS:  Please see the history of present illness.  All other ROS reviewed and negative.     Physical Exam/Data:   Vitals:   11/15/17 2030 11/15/17 2100 11/15/17 2156 11/15/17 2200  BP: (!) 129/104 (!) 128/106  (!) 133/104  Pulse: (!) 138 (!) 135  (!) 134  Resp: 19 17  16   Temp:      TempSrc:      SpO2: 99% 99%  99%  Weight:   108.8 kg   Height:   6' (1.829 m)    No intake or output data in the 24 hours ending 11/15/17 2334 Filed Weights   11/15/17 2156    Weight: 108.8 kg   Body mass index is 32.53 kg/m.  General: Well developed, well nourished, in no acute distress. Head: Normocephalic, atraumatic, sclera non-icteric, no xanthomas, nares are without discharge.  Neck: Negative for carotid bruits. JVD not elevated. Lungs: Clear bilaterally to auscultation without wheezes, rales, or rhonchi. Breathing is unlabored. Heart: RRR with S1 S2. No murmurs, rubs, or gallops appreciated. Abdomen: Soft, non-tender, non-distended with normoactive bowel sounds. No hepatomegaly. No rebound/guarding. No obvious abdominal masses. Msk:  Strength and tone appear normal for age. Extremities: No clubbing or cyanosis. No edema.  Distal pedal pulses are 2+ and equal bilaterally. Neuro: Alert and oriented X 3. No facial asymmetry. No focal deficit. Moves all extremities spontaneously. Psych:  Responds to questions appropriately with a normal affect.  EKG:  The EKG was personally reviewed and demonstrates atrial flutter with 2:1 block  Laboratory Data:  Chemistry Recent Labs  Lab 11/15/17 1744  NA 139  K 4.2  CL 107  CO2 21*  GLUCOSE 107*  BUN 27*  CREATININE 1.36*  CALCIUM 9.0  GFRNONAA 57*  GFRAA >60  ANIONGAP 11    No results for input(s): PROT, ALBUMIN, AST, ALT, ALKPHOS, BILITOT in the last 168 hours. Hematology Recent Labs  Lab 11/15/17 1744  WBC 5.8  RBC 4.76  HGB 13.4  HCT 42.2  MCV 88.7  MCH 28.2  MCHC 31.8  RDW 14.4  PLT 260   Cardiac EnzymesNo results for input(s): TROPONINI in the last 168 hours.  Recent Labs  Lab 11/15/17 2027  TROPIPOC 0.00    BNP Recent Labs  Lab 11/15/17 2024  BNP 640.2*    DDimer No results for input(s): DDIMER in the last 168 hours.  Radiology/Studies:  Dg Chest 2 View  Result Date: 11/15/2017 CLINICAL DATA:  Intermittent shortness of breath for 1.0-1.5 weeks, history of palpitations, hypertension EXAM: CHEST - 2 VIEW COMPARISON:  01/25/2009 FINDINGS: Enlargement of cardiac silhouette.  Mediastinal contours and pulmonary vascularity normal. Lungs clear. No pulmonary infiltrate, pleural effusion or pneumothorax. Osseous structures unremarkable. IMPRESSION: Enlargement of cardiac silhouette. Otherwise negative exam. Electronically Signed   By: Lavonia Dana M.D.   On: 11/15/2017 18:22   Ct Angio Chest Pe W/cm &/or Wo Cm  Result Date: 11/15/2017 CLINICAL DATA:  Weakness and fatigue with intermittent dyspnea. One episode of vomiting today. No reported chest pain. EXAM: CT ANGIOGRAPHY CHEST WITH CONTRAST TECHNIQUE: Multidetector CT imaging of the chest was performed using the standard protocol during bolus administration of intravenous contrast. Multiplanar CT image reconstructions and MIPs were obtained to evaluate the vascular anatomy. CONTRAST:  171mL ISOVUE-370 IOPAMIDOL (ISOVUE-370) INJECTION 76% COMPARISON:  Same day CXR FINDINGS: Cardiovascular: Cardiomegaly is noted with right atrial enlargement. No pericardial effusion is seen. Satisfactory opacification of the pulmonary arteries without acute pulmonary embolus to the segmental level. Reflux of  contrast is noted into the hepatic veins. No dilatation of main pulmonary artery to suggest pulmonary hypertension as an etiology. Nonaneurysmal thoracic aorta. Mediastinum/Nodes: No enlarged mediastinal, hilar, or axillary lymph nodes. Thyroid gland, trachea, and esophagus demonstrate no significant findings. Lungs/Pleura: Mild bilateral diffuse peribronchial thickening. No pulmonary consolidation, effusion or pneumothorax. No dominant mass. Upper Abdomen: No acute abnormality. Musculoskeletal: No chest wall abnormality. No acute or significant osseous findings. Review of the MIP images confirms the above findings. IMPRESSION: No acute pulmonary embolus. Cardiomegaly with right atrial enlargement and reflux of contrast into the right hepatic veins raising suspicion possible right heart failure. Diffuse mild peribronchial thickening without acute  pulmonary consolidation. Electronically Signed   By: Ashley Royalty M.D.   On: 11/15/2017 23:05   11/10/2017 Transthoracic echocardiogram personally reviewed LVEF = 10-15% (Severely reduced) with no evidence of LV thrombus  Moderate MR  RV moderately dilated. Normal RV systolic function.  RVSP = 37 LA moderately dilated  CT PE personally reviewed IMPRESSION: No acute pulmonary embolus. Cardiomegaly with right atrial enlargement and reflux of contrast into the right hepatic veins raising suspicion possible right heart Failure.  Diffuse mild peribronchial thickening without acute pulmonary Consolidation.  10/26/2017 EKG Atrial flutter rate 139, incomplete RBBB  EKG today Atrial flutter rate 136   Assessment and Plan:   Carlos Harrison is a 58 year old gentlemen with recently diagnosed cardiomyopathy (LVEF=10-15%), hypertension, hyperlipidemia, pre-diabetes mellitus who presents with nausea and vomiting.  His presentation is concerning for worsening HF syndrome in the setting of his atrial flutter.  Problem  #1 Atrial flutter with 2:1 atrioventricular block. He has been in this rhythm for at least three weeks and needs rhythm control to avoid further exacerbation of his heart failure syndrome. His CHADS2VASC score is 2 (+1 for hypertension, +1 for systolic heart failure). I would avoid any AV nodal agents overnight as these are unlikely to be particularly effective for rate control of atrial flutter and may precipitate worsening HF due to negative inotropic effects.  Recommendations - Continue therapeutic anticoagulation with DOAC. - Hold non-dihydropyridine CCB and BB overnight. - Please keep him nothing by mouth after midnight in anticipation of TEE-DCCV tomorrow.   Problem #2 Acute on chronic systolic heart failure. He has a presentation consistent with worsening of his chronic systolic heart failure. This is likely due to uncontrolled arrythmia. However the underlying etiology of his  cardiomyopathy has not been delineated. He certainly has risk factors for coronary artery disease and this will need to be investigated after his rhythm has been controlled.   Recommendations - Hold diuresis for now and okay to hold ARB with mild AKI.  - Plan to restart carvedilol or metoprolol succinate as GDMT for HFrEF following DCCV.  - Needs eventual ischemic evaluation.  Problem #3 Hyperlipidemia  Recommendations Continue rosuvastatin.   For questions or updates, please contact [place] Please consult [email or web address] for contact info under Cardiology/STEMI.    Signed, Perley Jain, MD  11/15/2017 11:34 PM

## 2017-11-15 NOTE — ED Notes (Signed)
Patient transported to CT 

## 2017-11-15 NOTE — H&P (Signed)
History and Physical    Carlos Harrison EHU:[id number] DOB: [birth year] DOA: 11/15/2017  Referring MD/NP/PA:   PCP: [name], MD   Patient coming from:  The patient is coming from home.  At baseline, pt is independent for most of ADL.   Chief Complaint: Shortness of breath, generalized weakness, nausea, vomiting and abdominal pain  HPI: Carlos Harrison is a 57 y.o. male with medical history significant of hypertension, hyperlipidemia, atrial flutter on Eliquis, CKD-2, sCHF with EF 10%, who presents with shortness of breath, generalized weakness, nausea, vomiting and abdominal pain.  Patient states that he has been having generalized weakness and shortness of breath for more than 7 days, which has worsened today.  Patient does not have chest pain or cough.  No fever or chills.  No calf tenderness.  Patient has nausea and vomited several times in this morning.  No diarrhea.  He also has mild right upper quadrant abdominal pain, which has resolved.  Currently patient does not have nausea, vomiting, abdominal pain.  No symptoms of UTI.  Patient states that he has been taking Eliquis A flutter. Due to insurance reason, he needs to switch Eliquis to Xarelto per his cardiologist, Dr. Johnsie Cancel. Of note , pt had 2D echo on 11/10/17, which showed EF of 10 to 15%.  ED Course: pt was found to have WBC 5.8, BNP 640, negative troponin, negative urinalysis, slightly worsening renal function, temperature normal, a flutter with RVR, oxygen saturation 97% on room air, chest x-ray showed cardiomegaly without pulmonary edema.  CT angiogram negative for PE. Patient is placed on stepdown bed for observation.  Cardiology, Dr. Lennie Muckle was consulted.  Review of Systems:   General: no fevers, chills, no body weight gain, has poor appetite, has fatigue HEENT: no blurry vision, hearing changes or sore throat Respiratory: has dyspnea, no coughing, wheezing CV: no chest pain, no palpitations GI: has nausea,  vomiting, abdominal pain, no diarrhea, constipation GU: no dysuria, burning on urination, increased urinary frequency, hematuria  Ext: no leg edema Neuro: no unilateral weakness, numbness, or tingling, no vision change or hearing loss Skin: no rash, no skin tear. MSK: No muscle spasm, no deformity, no limitation of range of movement in spin Heme: No easy bruising.  Travel history: No recent long distant travel.  Allergy: No Known Allergies  Past Medical History:  Diagnosis Date  . Palpitations 10/26/2017    Past Surgical History:  Procedure Laterality Date  . EYE SURGERY Left     Social History:  reports that he has never smoked. He has never used smokeless tobacco. He reports that he does not drink alcohol or use drugs.  Family History:  Family History  Problem Relation Age of Onset  . Ovarian cancer Mother      Prior to Admission medications   Medication Sig Start Date End Date Taking? Authorizing Provider  metoprolol tartrate (LOPRESSOR) 50 MG tablet Take 1 tablet (50 mg total) by mouth 2 (two) times daily. 11/02/17   Josue Hector, MD  rivaroxaban (XARELTO) 20 MG TABS tablet Take 1 tablet (20 mg total) by mouth daily with supper. 11/11/17   [name], MD  rosuvastatin (CRESTOR) 10 MG tablet TAKE 1 TABLET BY MOUTH EVERY DAY 10/24/17   [name], MD  telmisartan (MICARDIS) 40 MG tablet Take 1 tablet (40 mg total) by mouth daily. 11/04/17   Biagio Borg, MD    Physical Exam: Vitals:   11/15/17 2030 11/15/17 2100 11/15/17 2156 11/15/17  2200  BP: (!) 129/104 (!) 128/106  (!) 133/104  Pulse: (!) 138 (!) 135  (!) 134  Resp: 19 17  16   Temp:      TempSrc:      SpO2: 99% 99%  99%  Weight:   108.8 kg   Height:   6' (1.829 m)    General: Not in acute distress HEENT:       Eyes: PERRL, EOMI, no scleral icterus.       ENT: No discharge from the ears and nose, no pharynx injection, no tonsillar enlargement.        Neck: Positive JVD, no bruit, no mass felt. Heme: No  neck lymph node enlargement. Cardiac: S1/S2, RRR, No murmurs, No gallops or rubs. Respiratory: No rales, wheezing, rhonchi or rubs. GI: Soft, nondistended, nontender, no rebound pain, no organomegaly, BS present. GU: No hematuria Ext: No pitting leg edema bilaterally. 2+DP/PT pulse bilaterally. Musculoskeletal: No joint deformities, No joint redness or warmth, no limitation of ROM in spin. Skin: No rashes.  Neuro: Alert, oriented X3, cranial nerves II-XII grossly intact, moves all extremities normally. Psych: Patient is not psychotic, no suicidal or hemocidal ideation.  Labs on Admission: I have personally reviewed following labs and imaging studies  CBC: Recent Labs  Lab 11/15/17 1744  WBC 5.8  HGB 13.4  HCT 42.2  MCV 88.7  PLT 161   Basic Metabolic Panel: Recent Labs  Lab 11/15/17 1744  NA 139  K 4.2  CL 107  CO2 21*  GLUCOSE 107*  BUN 27*  CREATININE 1.36*  CALCIUM 9.0   GFR: Estimated Creatinine Clearance: 78.2 mL/min (A) (by C-G formula based on SCr of 1.36 mg/dL (H)). Liver Function Tests: No results for input(s): AST, ALT, ALKPHOS, BILITOT, PROT, ALBUMIN in the last 168 hours. No results for input(s): LIPASE, AMYLASE in the last 168 hours. No results for input(s): AMMONIA in the last 168 hours. Coagulation Profile: No results for input(s): INR, PROTIME in the last 168 hours. Cardiac Enzymes: No results for input(s): CKTOTAL, CKMB, CKMBINDEX, TROPONINI in the last 168 hours. BNP (last 3 results) No results for input(s): PROBNP in the last 8760 hours. HbA1C: No results for input(s): HGBA1C in the last 72 hours. CBG: No results for input(s): GLUCAP in the last 168 hours. Lipid Profile: No results for input(s): CHOL, HDL, LDLCALC, TRIG, CHOLHDL, LDLDIRECT in the last 72 hours. Thyroid Function Tests: No results for input(s): TSH, T4TOTAL, FREET4, T3FREE, THYROIDAB in the last 72 hours. Anemia Panel: No results for input(s): VITAMINB12, FOLATE, FERRITIN,  TIBC, IRON, RETICCTPCT in the last 72 hours. Urine analysis:    Component Value Date/Time   COLORURINE YELLOW 11/15/2017 2156   APPEARANCEUR HAZY (A) 11/15/2017 2156   LABSPEC 1.028 11/15/2017 2156   PHURINE 5.0 11/15/2017 2156   GLUCOSEU NEGATIVE 11/15/2017 2156   HGBUR NEGATIVE 11/15/2017 2156   [place] NEGATIVE 11/15/2017 2156   KETONESUR 20 (A) 11/15/2017 2156   PROTEINUR 30 (A) 11/15/2017 2156   NITRITE NEGATIVE 11/15/2017 2156   LEUKOCYTESUR NEGATIVE 11/15/2017 2156   Sepsis Labs: @LABRCNTIP (procalcitonin:4,lacticidven:4) )No results found for this or any previous visit (from the past 240 hour(s)).   Radiological Exams on Admission: Dg Chest 2 View  Result Date: 11/15/2017 CLINICAL DATA:  Intermittent shortness of breath for 1.0-1.5 weeks, history of palpitations, hypertension EXAM: CHEST - 2 VIEW COMPARISON:  01/25/2009 FINDINGS: Enlargement of cardiac silhouette. Mediastinal contours and pulmonary vascularity normal. Lungs clear. No pulmonary infiltrate, pleural effusion or pneumothorax. Osseous structures  unremarkable. IMPRESSION: Enlargement of cardiac silhouette. Otherwise negative exam. Electronically Signed   By: Lavonia Dana M.D.   On: 11/15/2017 18:22   Ct Angio Chest Pe W/cm &/or Wo Cm  Result Date: 11/15/2017 CLINICAL DATA:  Weakness and fatigue with intermittent dyspnea. One episode of vomiting today. No reported chest pain. EXAM: CT ANGIOGRAPHY CHEST WITH CONTRAST TECHNIQUE: Multidetector CT imaging of the chest was performed using the standard protocol during bolus administration of intravenous contrast. Multiplanar CT image reconstructions and MIPs were obtained to evaluate the vascular anatomy. CONTRAST:  155mL ISOVUE-370 IOPAMIDOL (ISOVUE-370) INJECTION 76% COMPARISON:  Same day CXR FINDINGS: Cardiovascular: Cardiomegaly is noted with right atrial enlargement. No pericardial effusion is seen. Satisfactory opacification of the pulmonary arteries without acute  pulmonary embolus to the segmental level. Reflux of contrast is noted into the hepatic veins. No dilatation of main pulmonary artery to suggest pulmonary hypertension as an etiology. Nonaneurysmal thoracic aorta. Mediastinum/Nodes: No enlarged mediastinal, hilar, or axillary lymph nodes. Thyroid gland, trachea, and esophagus demonstrate no significant findings. Lungs/Pleura: Mild bilateral diffuse peribronchial thickening. No pulmonary consolidation, effusion or pneumothorax. No dominant mass. Upper Abdomen: No acute abnormality. Musculoskeletal: No chest wall abnormality. No acute or significant osseous findings. Review of the MIP images confirms the above findings. IMPRESSION: No acute pulmonary embolus. Cardiomegaly with right atrial enlargement and reflux of contrast into the right hepatic veins raising suspicion possible right heart failure. Diffuse mild peribronchial thickening without acute pulmonary consolidation. Electronically Signed   By: Ashley Royalty M.D.   On: 11/15/2017 23:05     EKG: Independently reviewed.  QTC 415, atrial flutter with RVR   Assessment/Plan Principal Problem:   Atrial flutter with rapid ventricular response (HCC) Active Problems:   Hypertension   Hyperlipidemia   Acute systolic (congestive) heart failure (HCC)   Chronic kidney disease (CKD), stage II (mild)   Nausea & vomiting   Atrial flutter with rapid ventricular response ([place]): CHADS2VASC score is 2.  Cardiology was consulted, per Dr. Lennie Muckle, avoid any AV nodal agents overnight as it may precipitate worsening HF given EF of 10%. Likely TEE-DCCV tomorrow per card.  -place on SDU for obs -continue home metoprolol -Switch Eliquis to Xarelto -keep pt NPO per card  Acute systolic heart failure: pt had 2D echo on 11/10/2017, which showed EF 10 to 15%.  Patient is not on diuretics.  Patient has elevated BNP 640, worsening shortness of breath, indicating worsening CHF.  Patient was given 1 dose of Lasix 40 mg  x 1 in ED.  Per cardiology, will hold further diuresis due to worsening renal function. -Troponin x3 -Continue metoprolol  Hypertension: -Switch Micardis to amlodipine due to worsening renal function - Continue metoprolol -IV hydralazine as needed  Hyperlipidemia: -Crestor  Chronic kidney disease (CKD), stage II (mild): Baseline creatinine 1.0-1.2.  His creatinine is 1.36, BUN 27, slightly worsening in the baseline. - Hold Micardis - Follow-up renal function by BMP  Nausea & vomiting and abdominal pain: Symptoms have resolved.  Etiology is not clear if it may be due to GI congestion secondary to CHF. -Check liver function -Check lipase    DVT ppx: on Xarelto Code Status: Full code Family Communication: None at bed side.    Disposition Plan:  Anticipate discharge back to previous home environment Consults called:  Card, Dr. Lennie Muckle Admission status:   SDU/obs  Date of Service 11/15/2017    Ivor Costa [facility] Pager [phone]  If 7PM-7AM, please contact night-coverage [email or web address] Password [employer] 11/15/2017, 11:33 PM

## 2017-11-15 NOTE — Progress Notes (Signed)
ANTICOAGULATION CONSULT NOTE - Initial Consult  Pharmacy Consult for Xarelto Indication: atrial fibrillation  No Known Allergies  Patient Measurements: Height: 6' (182.9 cm) Weight: 239 lb 13.8 oz (108.8 kg) IBW/kg (Calculated) : 77.6  Vital Signs: Temp: 97.7 F (36.5 C) (08/20 1729) Temp Source: Oral (08/20 1729) BP: 133/104 (08/20 2200) Pulse Rate: 134 (08/20 2200)  Labs: Recent Labs    11/15/17 1744  HGB 13.4  HCT 42.2  PLT 260  CREATININE 1.36*    Estimated Creatinine Clearance: 78.2 mL/min (A) (by C-G formula based on SCr of 1.36 mg/dL (H)).   Medical History: Past Medical History:  Diagnosis Date  . Palpitations 10/26/2017    Medications:  No current facility-administered medications on file prior to encounter.    Current Outpatient Medications on File Prior to Encounter  Medication Sig Dispense Refill  . apixaban (ELIQUIS) 5 MG TABS tablet Take 5 mg by mouth 2 (two) times daily.    . metoprolol tartrate (LOPRESSOR) 50 MG tablet Take 1 tablet (50 mg total) by mouth 2 (two) times daily. 180 tablet 3  . rosuvastatin (CRESTOR) 10 MG tablet TAKE 1 TABLET BY MOUTH EVERY DAY 90 tablet 1  . telmisartan (MICARDIS) 40 MG tablet Take 1 tablet (40 mg total) by mouth daily. 90 tablet 3  . rivaroxaban (XARELTO) 20 MG TABS tablet Take 1 tablet (20 mg total) by mouth daily with supper. (Patient not taking: Reported on 11/15/2017) 30 tablet 5     Assessment: 57 y.o. male with Afib for Xarelto.   Previously on Eliquis, last dose this morning   Plan:  Xarelto 20 mg daily  Caryl Pina 11/15/2017,11:26 PM

## 2017-11-15 NOTE — Progress Notes (Deleted)
Subjective:    Patient ID: Carlos Harrison, male    DOB: Jul 14, 1960, 57 y.o.   MRN: [id number]  HPI The patient is here for an acute visit.   fatigue  Medications and allergies reviewed with patient and updated if appropriate.  Patient Active Problem List   Diagnosis Date Noted  . Atrial flutter ([place]) 10/26/2017  . Tachycardia 10/26/2017  . Prediabetes 04/27/2017  . Hypertension 02/21/2017  . Hyperlipidemia 02/21/2017  . Sacroiliac joint dysfunction of right side 07/30/2014    Current Outpatient Medications on File Prior to Visit  Medication Sig Dispense Refill  . metoprolol tartrate (LOPRESSOR) 50 MG tablet Take 1 tablet (50 mg total) by mouth 2 (two) times daily. 180 tablet 3  . rivaroxaban (XARELTO) 20 MG TABS tablet Take 1 tablet (20 mg total) by mouth daily with supper. 30 tablet 5  . rosuvastatin (CRESTOR) 10 MG tablet TAKE 1 TABLET BY MOUTH EVERY DAY 90 tablet 1  . telmisartan (MICARDIS) 40 MG tablet Take 1 tablet (40 mg total) by mouth daily. 90 tablet 3   No current facility-administered medications on file prior to visit.     Past Medical History:  Diagnosis Date  . Palpitations 10/26/2017    Past Surgical History:  Procedure Laterality Date  . EYE SURGERY Left     Social History   Socioeconomic History  . Marital status: Married    Spouse name: Not on file  . Number of children: 68  . Years of education: 55  . Highest education level: Not on file  Occupational History  . Occupation: [employer]  Social Needs  . Financial resource strain: Not on file  . Food insecurity:    Worry: Not on file    Inability: Not on file  . Transportation needs:    Medical: Not on file    Non-medical: Not on file  Tobacco Use  . Smoking status: Never Smoker  . Smokeless tobacco: Never Used  Substance and Sexual Activity  . Alcohol use: No  . Drug use: No  . Sexual activity: Not on file  Lifestyle  . Physical activity:    Days per week: Not on file   Minutes per session: Not on file  . Stress: Not on file  Relationships  . Social connections:    Talks on phone: Not on file    Gets together: Not on file    Attends religious service: Not on file    Active member of club or organization: Not on file    Attends meetings of clubs or organizations: Not on file    Relationship status: Not on file  Other Topics Concern  . Not on file  Social History Narrative   Fun: Mentoring youth, solitary games.    Denies any religious beliefs effecting health care.     Family History  Problem Relation Age of Onset  . Ovarian cancer Mother     Review of Systems     Objective:  There were no vitals filed for this visit. BP Readings from Last 3 Encounters:  11/15/17 (!) 123/99  11/15/17 106/90  11/02/17 (!) 130/92   Wt Readings from Last 3 Encounters:  11/15/17 240 lb (108.9 kg)  11/02/17 241 lb 8 oz (109.5 kg)  10/26/17 241 lb (109.3 kg)   There is no height or weight on file to calculate BMI.   Physical Exam         Assessment & Plan:    See Problem List  for Assessment and Plan of chronic medical problems.

## 2017-11-16 ENCOUNTER — Observation Stay (HOSPITAL_COMMUNITY): Payer: BLUE CROSS/BLUE SHIELD | Admitting: Anesthesiology

## 2017-11-16 ENCOUNTER — Encounter (HOSPITAL_COMMUNITY): Payer: Self-pay | Admitting: General Practice

## 2017-11-16 ENCOUNTER — Encounter: Payer: Self-pay | Admitting: Nurse Practitioner

## 2017-11-16 ENCOUNTER — Encounter (HOSPITAL_COMMUNITY)
Admission: EM | Disposition: A | Discharge status: Home / Self Care | Payer: Self-pay | Source: Home / Self Care | Attending: Cardiovascular Disease

## 2017-11-16 ENCOUNTER — Ambulatory Visit: Payer: BLUE CROSS/BLUE SHIELD | Admitting: Internal Medicine

## 2017-11-16 ENCOUNTER — Observation Stay (HOSPITAL_BASED_OUTPATIENT_CLINIC_OR_DEPARTMENT_OTHER): Payer: BLUE CROSS/BLUE SHIELD

## 2017-11-16 DIAGNOSIS — I340 Nonrheumatic mitral (valve) insufficiency: Secondary | ICD-10-CM

## 2017-11-16 DIAGNOSIS — I051 Rheumatic mitral insufficiency: Secondary | ICD-10-CM | POA: Diagnosis not present

## 2017-11-16 DIAGNOSIS — I5043 Acute on chronic combined systolic (congestive) and diastolic (congestive) heart failure: Secondary | ICD-10-CM | POA: Diagnosis not present

## 2017-11-16 DIAGNOSIS — I4892 Unspecified atrial flutter: Secondary | ICD-10-CM | POA: Diagnosis not present

## 2017-11-16 DIAGNOSIS — I5022 Chronic systolic (congestive) heart failure: Secondary | ICD-10-CM | POA: Diagnosis not present

## 2017-11-16 DIAGNOSIS — I130 Hypertensive heart and chronic kidney disease with heart failure and stage 1 through stage 4 chronic kidney disease, or unspecified chronic kidney disease: Secondary | ICD-10-CM | POA: Diagnosis not present

## 2017-11-16 HISTORY — PX: TEE WITHOUT CARDIOVERSION: SHX5443

## 2017-11-16 HISTORY — PX: CARDIOVERSION: SHX1299

## 2017-11-16 HISTORY — PX: TEE WITH CARDIOVERSION: SHX5442

## 2017-11-16 LAB — HIV ANTIBODY (ROUTINE TESTING W REFLEX): HIV Screen 4th Generation wRfx: NONREACTIVE

## 2017-11-16 LAB — HEPATIC FUNCTION PANEL
ALT: 107 U/L — ABNORMAL HIGH (ref 0–44)
AST: 83 U/L — ABNORMAL HIGH (ref 15–41)
Albumin: 3.4 g/dL — ABNORMAL LOW (ref 3.5–5.0)
Alkaline Phosphatase: 47 U/L (ref 38–126)
Bilirubin, Direct: 0.4 mg/dL — ABNORMAL HIGH (ref 0.0–0.2)
Indirect Bilirubin: 1.3 mg/dL — ABNORMAL HIGH (ref 0.3–0.9)
Total Bilirubin: 1.7 mg/dL — ABNORMAL HIGH (ref 0.3–1.2)
Total Protein: 5.9 g/dL — ABNORMAL LOW (ref 6.5–8.1)

## 2017-11-16 LAB — TROPONIN I
Troponin I: <0.03 ng/mL (ref <0.03)
Troponin I: <0.03 ng/mL (ref <0.03)
Troponin I: <0.03 ng/mL (ref <0.03)

## 2017-11-16 LAB — BASIC METABOLIC PANEL
Anion gap: 11 (ref 5–15)
BUN: 25 mg/dL — ABNORMAL HIGH (ref 6–20)
CO2: 20 mmol/L — ABNORMAL LOW (ref 22–32)
Calcium: 8.7 mg/dL — ABNORMAL LOW (ref 8.9–10.3)
Chloride: 107 mmol/L (ref 98–111)
Creatinine, Ser: 1.19 mg/dL (ref 0.61–1.24)
GFR calc Af Amer: >60 mL/min (ref >60)
GFR calc non Af Amer: >60 mL/min (ref >60)
Glucose, Bld: 132 mg/dL — ABNORMAL HIGH (ref 70–99)
Potassium: 4 mmol/L (ref 3.5–5.1)
Sodium: 138 mmol/L (ref 135–145)

## 2017-11-16 LAB — CBC
HCT: 40.1 % (ref 39.0–52.0)
Hemoglobin: 13 g/dL (ref 13.0–17.0)
MCH: 28.3 pg (ref 26.0–34.0)
MCHC: 32.4 g/dL (ref 30.0–36.0)
MCV: 87.2 fL (ref 78.0–100.0)
Platelets: 226 10*3/uL (ref 150–400)
RBC: 4.6 MIL/uL (ref 4.22–5.81)
RDW: 14.5 % (ref 11.5–15.5)
WBC: 5.2 10*3/uL (ref 4.0–10.5)

## 2017-11-16 LAB — LIPASE, BLOOD: Lipase: 28 U/L (ref 11–51)

## 2017-11-16 LAB — MRSA PCR SCREENING: MRSA by PCR: NEGATIVE

## 2017-11-16 SURGERY — ECHOCARDIOGRAM, TRANSESOPHAGEAL
Anesthesia: Monitor Anesthesia Care

## 2017-11-16 MED ORDER — SODIUM CHLORIDE 0.9% FLUSH: 3.0000 mL | Freq: Two times a day (BID) | INTRAVENOUS | Status: DC

## 2017-11-16 MED ORDER — PROPOFOL 500 MG/50ML IV EMUL
INTRAVENOUS | Status: DC | PRN
  Administered 2017-11-16: 50 ug/kg/min via INTRAVENOUS

## 2017-11-16 MED ORDER — OFF THE BEAT BOOK
Freq: Once | Status: AC
  Administered 2017-11-16: 1
  Filled 2017-11-16: qty 1

## 2017-11-16 MED ORDER — IRBESARTAN 75 MG PO TABS
150.0000 mg | ORAL_TABLET | Freq: Every day | ORAL | Status: DC
  Filled 2017-11-16: qty 2

## 2017-11-16 MED ORDER — FENTANYL CITRATE (PF) 250 MCG/5ML IJ SOLN
INTRAMUSCULAR | Status: DC | PRN
  Administered 2017-11-16: 50 ug via INTRAVENOUS

## 2017-11-16 MED ORDER — PHENYLEPHRINE 40 MCG/ML (10ML) SYRINGE FOR IV PUSH (FOR BLOOD PRESSURE SUPPORT)
PREFILLED_SYRINGE | INTRAVENOUS | Status: DC | PRN
  Administered 2017-11-16: 120 ug via INTRAVENOUS
  Administered 2017-11-16: 80 ug via INTRAVENOUS
  Administered 2017-11-16: 120 ug via INTRAVENOUS

## 2017-11-16 MED ORDER — FENTANYL CITRATE (PF) 250 MCG/5ML IJ SOLN
INTRAMUSCULAR | Status: AC
  Filled 2017-11-16: qty 5

## 2017-11-16 MED ORDER — MIDAZOLAM HCL 5 MG/ML IJ SOLN
INTRAMUSCULAR | Status: AC
  Filled 2017-11-16: qty 1

## 2017-11-16 MED ORDER — LIVING BETTER WITH HEART FAILURE BOOK
Freq: Once | Status: AC
  Administered 2017-11-16: 1

## 2017-11-16 MED ORDER — SODIUM CHLORIDE 0.9 % IV SOLN: 250.0000 mL | INTRAVENOUS | Status: DC

## 2017-11-16 MED ORDER — MIDAZOLAM HCL 5 MG/ML IJ SOLN
INTRAMUSCULAR | Status: DC | PRN
  Administered 2017-11-16: 1 mg via INTRAVENOUS

## 2017-11-16 MED ORDER — SODIUM CHLORIDE 0.9% FLUSH: 3.0000 mL | INTRAVENOUS | Status: DC | PRN

## 2017-11-16 MED ORDER — PROPOFOL 10 MG/ML IV BOLUS
INTRAVENOUS | Status: DC | PRN
  Administered 2017-11-16: 30 mg via INTRAVENOUS

## 2017-11-16 MED ORDER — EPHEDRINE SULFATE-NACL 50-0.9 MG/10ML-% IV SOSY
PREFILLED_SYRINGE | INTRAVENOUS | Status: DC | PRN
  Administered 2017-11-16: 15 mg via INTRAVENOUS

## 2017-11-16 MED ORDER — BUTAMBEN-TETRACAINE-BENZOCAINE 2-2-14 % EX AERO
INHALATION_SPRAY | CUTANEOUS | Status: DC | PRN
  Administered 2017-11-16: 2 via TOPICAL

## 2017-11-16 MED ORDER — LACTATED RINGERS IV SOLN
INTRAVENOUS | Status: DC
  Administered 2017-11-16: 1000 mL via INTRAVENOUS

## 2017-11-16 MED ORDER — SODIUM CHLORIDE 0.9 % IV SOLN: INTRAVENOUS | Status: DC

## 2017-11-16 NOTE — Care Management Note (Addendum)
Case Management Note  Patient Details  Name: Carlos Harrison MRN: [id number] Date of Birth: 12/13/1960  Subjective/Objective:    From home, for cardioversion today , will be on xarelto, per benefit check , RN will give patient the 30 day and 10 copay card.  NCM printed the patient assistance app out for patient, MD will need to fill out their part, in case patient needs to use this application.                   Action/Plan: DC home when ready.  Expected Discharge Date:                  Expected Discharge Plan:  Home/Self Care  In-House Referral:     Discharge planning Services  CM Consult, Medication Assistance  Post Acute Care Choice:    Choice offered to:     DME Arranged:    DME Agency:     HH Arranged:    HH Agency:     Status of Service:  In process, will continue to follow  If discussed at Long Length of Stay Meetings, dates discussed:    Additional Comments:  Zenon Mayo, RN 11/16/2017, 2:13 PM

## 2017-11-16 NOTE — Anesthesia Postprocedure Evaluation (Signed)
Anesthesia Post Note  Patient: Carlos Harrison  Procedure(s) Performed: TRANSESOPHAGEAL ECHOCARDIOGRAM (TEE) (N/A ) CARDIOVERSION (N/A )     Patient location during evaluation: PACU Anesthesia Type: MAC Level of consciousness: awake and alert Pain management: pain level controlled Vital Signs Assessment: post-procedure vital signs reviewed and stable Respiratory status: spontaneous breathing and respiratory function stable Cardiovascular status: stable Postop Assessment: no apparent nausea or vomiting Anesthetic complications: no    Last Vitals:  Vitals:   11/16/17 1240 11/16/17 1250  BP: 110/82 114/87  Pulse: 74 68  Resp: 15 10  Temp:    SpO2: 98% 96%    Last Pain:  Vitals:   11/16/17 1250  TempSrc:   PainSc: 0-No pain                 SINGER,JAMES DANIEL

## 2017-11-16 NOTE — Discharge Instructions (Addendum)

## 2017-11-16 NOTE — Progress Notes (Signed)
[place]  Cardiology assuming care  Verneita Griffes, MD [facility] 10:09 AM

## 2017-11-16 NOTE — CV Procedure (Signed)
    Transesophageal Echocardiogram Note  NORMA IGNASIAK [id number] [birth year]  Procedure: Transesophageal Echocardiogram Indications: Atrial flutter   Procedure Details Consent: Obtained Time Out: Verified patient identification, verified procedure, site/side was marked, verified correct patient position, special equipment/implants available, Radiology Safety Procedures followed,  medications/allergies/relevent history reviewed, required imaging and test results available.  Performed  Medications:  During this procedure the patient is administered a total of Versed  1 mg, Fentanyl 50 micrograms, Propofol drip 80 mg, Neosynephrine 320 mcg by Dianna ( CRNA )  For the TEE and cardioversion   Left Ventrical:  Markedly reduced LV function.  EF 10-15%.  Extensive spontaneous contrast   Mitral Valve: mild - mod MR   Aortic Valve: normal   Tricuspid Valve: trace TR   Pulmonic Valve: normal   Left Atrium/ Left atrial appendage: no thrombi   Atrial septum: ? smal PFO with left to right flow by color doppler .  Intra atrial septum bows left to right   Aorta: normal    Complications: No apparent complications Patient did tolerate procedure well.      Cardioversion Note  KOU GUCCIARDO [id number] 1960/05/23  Procedure: DC Cardioversion Indications:  Atrial flutter   Procedure Details Consent: Obtained Time Out: Verified patient identification, verified procedure, site/side was marked, verified correct patient position, special equipment/implants available, Radiology Safety Procedures followed,  medications/allergies/relevent history reviewed, required imaging and test results available.  Performed  The patient has been on adequate anticoagulation.  The patient received IV propofol ( see above )  for sedation.  Synchronous cardioversion was performed at 50  joules.  The cardioversion was successful     Complications: No apparent complications Patient did tolerate  procedure well.   Thayer Headings, Brooke Bonito., MD, [facility] 11/16/2017, 12:24 PM

## 2017-11-16 NOTE — Progress Notes (Signed)
  Echocardiogram Echocardiogram Transesophageal has been performed.  Carlos Harrison 11/16/2017, 12:27 PM

## 2017-11-16 NOTE — Progress Notes (Signed)
Patient returned from TEE, cardioversion via wheelchair with endo staff. Alert, oriented, no complaints of pain or shortness of breath. Systolic blood pressure remains 100's, held ace receptor blocker and notified Cecilie Kicks NP and Dr. Acie Fredrickson. Reviewed plan of care with patient and Information about atrial fibrillation, TEE, cardioversion and clotting risk, xarelto, metoprolol. Co-pay for xarelto is $386.27 per Tomi Bamberger CM, patient informed. Given coupons for xarelto, 30 day free and $10.00 monthly coupons and reviewed information on requesting assistance if unable to utilize coupon and co-pay is still too high. Questions answered.  Resting quietly visiting with family at this time with no complaints.

## 2017-11-16 NOTE — Progress Notes (Addendum)
Progress Note  Patient Name: Carlos Harrison Date of Encounter: 11/16/2017  Primary Cardiologist: Jenkins Rouge, MD   Subjective   Resting comfortable with HR 144.  In flutter.  No SOB and no chest pain.   Inpatient Medications    Scheduled Meds: . amLODipine  5 mg Oral Daily  . iopamidol      . levalbuterol  1.25 mg Nebulization Q6H  . metoprolol tartrate  50 mg Oral BID  . rivaroxaban  20 mg Oral Q supper  . rosuvastatin  10 mg Oral Daily   Continuous Infusions:  PRN Meds: acetaminophen **OR** acetaminophen, hydrALAZINE, ondansetron **OR** ondansetron (ZOFRAN) IV, zolpidem   Vital Signs    Vitals:   11/16/17 0309 11/16/17 0312 11/16/17 0700 11/16/17 0820  BP:  (!) 120/100 126/64   Pulse:  (!) 133 (!) 121   Resp:  20 (!) 24   Temp:  (!) 97.4 F (36.3 C) 97.7 F (36.5 C)   TempSrc:  Oral Oral   SpO2:  99%  99%  Weight: 108.2 kg     Height: 6\' 3"  (1.905 m)       Intake/Output Summary (Last 24 hours) at 11/16/2017 0931 Last data filed at 11/16/2017 0700 Gross per 24 hour  Intake 0 ml  Output -  Net 0 ml   Filed Weights   11/15/17 2156 11/16/17 0309  Weight: 108.8 kg 108.2 kg    Telemetry    A flutter mostly > 105  - Personally Reviewed  ECG    No new - Personally Reviewed  Physical Exam   GEN: No acute distress.   Neck: No JVD Cardiac: Irreg, tachy  Respiratory: Clear to auscultation bilaterally. GI: Soft, nontender, non-distended  MS: No edema; No deformity. Neuro:  Nonfocal  Psych: Normal affect   Labs    Chemistry Recent Labs  Lab 11/15/17 1744 11/16/17 0037  NA 139 138  K 4.2 4.0  CL 107 107  CO2 21* 20*  GLUCOSE 107* 132*  BUN 27* 25*  CREATININE 1.36* 1.19  CALCIUM 9.0 8.7*  PROT  --  5.9*  ALBUMIN  --  3.4*  AST  --  83*  ALT  --  107*  ALKPHOS  --  47  BILITOT  --  1.7*  GFRNONAA 57* >60  GFRAA >60 >60  ANIONGAP 11 11     Hematology Recent Labs  Lab 11/15/17 1744 11/16/17 0037  WBC 5.8 5.2  RBC 4.76 4.60   HGB 13.4 13.0  HCT 42.2 40.1  MCV 88.7 87.2  MCH 28.2 28.3  MCHC 31.8 32.4  RDW 14.4 14.5  PLT 260 226    Cardiac Enzymes Recent Labs  Lab 11/16/17 0759  TROPONINI <0.03    Recent Labs  Lab 11/15/17 2027  TROPIPOC 0.00     BNP Recent Labs  Lab 11/15/17 2024  BNP 640.2*     DDimer No results for input(s): DDIMER in the last 168 hours.   Radiology    Dg Chest 2 View  Result Date: 11/15/2017 CLINICAL DATA:  Intermittent shortness of breath for 1.0-1.5 weeks, history of palpitations, hypertension EXAM: CHEST - 2 VIEW COMPARISON:  01/25/2009 FINDINGS: Enlargement of cardiac silhouette. Mediastinal contours and pulmonary vascularity normal. Lungs clear. No pulmonary infiltrate, pleural effusion or pneumothorax. Osseous structures unremarkable. IMPRESSION: Enlargement of cardiac silhouette. Otherwise negative exam. Electronically Signed   By: Lavonia Dana M.D.   On: 11/15/2017 18:22   Ct Angio Chest Pe W/cm &/or Wo Cm  Result Date: 11/15/2017 CLINICAL DATA:  Weakness and fatigue with intermittent dyspnea. One episode of vomiting today. No reported chest pain. EXAM: CT ANGIOGRAPHY CHEST WITH CONTRAST TECHNIQUE: Multidetector CT imaging of the chest was performed using the standard protocol during bolus administration of intravenous contrast. Multiplanar CT image reconstructions and MIPs were obtained to evaluate the vascular anatomy. CONTRAST:  158mL ISOVUE-370 IOPAMIDOL (ISOVUE-370) INJECTION 76% COMPARISON:  Same day CXR FINDINGS: Cardiovascular: Cardiomegaly is noted with right atrial enlargement. No pericardial effusion is seen. Satisfactory opacification of the pulmonary arteries without acute pulmonary embolus to the segmental level. Reflux of contrast is noted into the hepatic veins. No dilatation of main pulmonary artery to suggest pulmonary hypertension as an etiology. Nonaneurysmal thoracic aorta. Mediastinum/Nodes: No enlarged mediastinal, hilar, or axillary lymph nodes.  Thyroid gland, trachea, and esophagus demonstrate no significant findings. Lungs/Pleura: Mild bilateral diffuse peribronchial thickening. No pulmonary consolidation, effusion or pneumothorax. No dominant mass. Upper Abdomen: No acute abnormality. Musculoskeletal: No chest wall abnormality. No acute or significant osseous findings. Review of the MIP images confirms the above findings. IMPRESSION: No acute pulmonary embolus. Cardiomegaly with right atrial enlargement and reflux of contrast into the right hepatic veins raising suspicion possible right heart failure. Diffuse mild peribronchial thickening without acute pulmonary consolidation. Electronically Signed   By: Ashley Royalty M.D.   On: 11/15/2017 23:05    Cardiac Studies   11/10/17 TTE Study Conclusions  - Procedure narrative: Transthoracic echocardiography. Image   quality was adequate. Intravenous contrast (Definity) was   administered to opacify the LV. - Left ventricle: The cavity size was moderately dilated. Wall   thickness was increased in a pattern of mild LVH. There was   moderate concentric hypertrophy. Systolic function was severely   reduced. The estimated ejection fraction was in the range of 10%   to 15%. Diffuse hypokinesis. The study is not technically   sufficient to allow evaluation of LV diastolic function. Acoustic   contrast opacification revealed no evidence ofthrombus. - Mitral valve: There was moderate regurgitation. - Left atrium: The atrium was moderately dilated. - Right ventricle: The cavity size was moderately dilated. Wall   thickness was normal. - Tricuspid valve: There was moderate regurgitation. - Pulmonary arteries: Systolic pressure was mildly increased. PA   peak pressure: 37 mm Hg (S).  Patient Profile     57 y.o. male with a history of recently diagnosed cardiomyopathy LVEF=10-15%, atrial fibrillation/flutter on rivaroxaban, hypertension, pre-diabetes mellitus.  CHA2DS2-VASc Score is 2.  Planned  for DCCV 11/24/17. Admitted 11/15/17 with dyspnea, weakness, nausea, vomiting and abd pain.    Assessment & Plan    A flutter with RVR at 136 on admit  --has been on Xarelto (had been on eliquis with samples and did miss one yesterday AM and and one other earlier in week) and plan for DCCV 11/24/17  --HR in office 91. Has been in a flutter since 10/26/17 at least --rate may be up due to HF and cardiomyopathy. --NPO possible TEE/DCCV today Dr. Acie Fredrickson to see  Acute on chronic systolic HF --with CM and EF 10-15%.  BNP 640  --per CT of chest  Cardiomegaly with right atrial enlargement and reflux of contrast into the right hepatic veins raising suspicion possible right heart failure --wt 108.2 Kg No diuretic  Cardiomyopathy moderately dilated.  EF 10-15% on 11/10/17 , mild LVH, moderate MR, LA was moderately dilated.  RV moderately dilated.  Moderate TR and PA pk pressure 37 mmHg.   HTN -on lopressor 50 BID,  micardis 40 mg ? change to coreg and entresto  HLD on crestor and LDL 85.  CKD 2-3 Cr improved this AM   Cardiology will assume care.       For questions or updates, please contact [place] Please consult [email or web address] for contact info under Cardiology/STEMI.      Signed, Cecilie Kicks, NP  11/16/2017, 9:31 AM    Attending Note:   The patient was seen and examined.  Agree with assessment and plan as noted above.  Changes made to the above note as needed.  Patient seen and independently examined with  Cecilie Kicks, NP .   We discussed all aspects of the encounter. I agree with the assessment and plan as stated above.  1.  Atrial flutter with rapid ventricular response: The patient's heart rate has been somewhat difficult to control.  He was found to have atrial flutter several weeks ago and was started on Eliquis which was then changed to Xarelto.  He missed 1 dose of anticoagulant last week.  His echo shows an ejection fraction of 10%.  We will try to do a TEE  cardioversion today.  Acute on chronic combined systolic and diastolic congestive heart failure: The patient's echocardiogram shows an ejection fraction of 10%.  He has presumed diastolic dysfunction. We will discontinue the amlodipine.  We will start him back on Micardis ( formulary substituted Avapro 150 - plan to change back to micardis upon discharge )  Continue metoprolol.   I have spent a total of 40 minutes with patient reviewing hospital  notes , telemetry, EKGs, labs and examining patient as well as establishing an assessment and plan that was discussed with the patient. > 50% of time was spent in direct patient care.    Thayer Headings, Brooke Bonito., MD, [facility] 11/16/2017, 9:51 AM [address],  [place] Pager [phone]

## 2017-11-16 NOTE — Transfer of Care (Signed)
Immediate Anesthesia Transfer of Care Note  Patient: Carlos Harrison  Procedure(s) Performed: TRANSESOPHAGEAL ECHOCARDIOGRAM (TEE) (N/A ) CARDIOVERSION (N/A )  Patient Location: Endoscopy Unit  Anesthesia Type:General  Level of Consciousness: drowsy  Airway & Oxygen Therapy: Patient Spontanous Breathing and Patient connected to nasal cannula oxygen  Post-op Assessment: Report given to RN and Post -op Vital signs reviewed and stable  Post vital signs: Reviewed and stable  Last Vitals:  Vitals Value Taken Time  BP    Temp    Pulse    Resp    SpO2      Last Pain:  Vitals:   11/16/17 1116  TempSrc: Oral  PainSc: 0-No pain         Complications: No apparent anesthesia complications

## 2017-11-16 NOTE — Anesthesia Preprocedure Evaluation (Addendum)
Anesthesia Evaluation  Patient identified by MRN, date of birth, ID band Patient awake    Reviewed: Allergy & Precautions, NPO status , Patient's Chart, lab work & pertinent test results, reviewed documented beta blocker date and time   History of Anesthesia Complications Negative for: history of anesthetic complications  Airway Mallampati: II  TM Distance: >3 FB Neck ROM: Full    Dental  (+) Dental Advisory Given, Poor Dentition   Pulmonary neg pulmonary ROS,    Pulmonary exam normal        Cardiovascular hypertension, Pt. on medications and Pt. on home beta blockers +CHF  + dysrhythmias Atrial Fibrillation  Rhythm:Irregular Rate:Tachycardia  Study Conclusions  - Procedure narrative: Transthoracic echocardiography. Image   quality was adequate. Intravenous contrast (Definity) was   administered to opacify the LV. - Left ventricle: The cavity size was moderately dilated. Wall   thickness was increased in a pattern of mild LVH. There was   moderate concentric hypertrophy. Systolic function was severely   reduced. The estimated ejection fraction was in the range of 10%   to 15%. Diffuse hypokinesis. The study is not technically   sufficient to allow evaluation of LV diastolic function. Acoustic   contrast opacification revealed no evidence ofthrombus. - Mitral valve: There was moderate regurgitation. - Left atrium: The atrium was moderately dilated. - Right ventricle: The cavity size was moderately dilated. Wall   thickness was normal. - Tricuspid valve: There was moderate regurgitation. - Pulmonary arteries: Systolic pressure was mildly increased. PA   peak pressure: 37 mm Hg (S).   Neuro/Psych negative neurological ROS  negative psych ROS   GI/Hepatic negative GI ROS, Neg liver ROS,   Endo/Other    Renal/GU Renal disease     Musculoskeletal   Abdominal   Peds  Hematology   Anesthesia Other Findings   Reproductive/Obstetrics                            Anesthesia Physical Anesthesia Plan  ASA: IV  Anesthesia Plan: MAC   Post-op Pain Management:    Induction:   PONV Risk Score and Plan: 2 and Ondansetron and Propofol infusion  Airway Management Planned: Natural Airway and Simple Face Mask  Additional Equipment:   Intra-op Plan:   Post-operative Plan:   Informed Consent: I have reviewed the patients History and Physical, chart, labs and discussed the procedure including the risks, benefits and alternatives for the proposed anesthesia with the patient or authorized representative who has indicated his/her understanding and acceptance.   Dental advisory given  Plan Discussed with: CRNA and Anesthesiologist  Anesthesia Plan Comments:         Anesthesia Quick Evaluation

## 2017-11-16 NOTE — Care Management (Signed)
11-16-17 BENEFITS CHECK :  #  5.   S/W  AMANDA @ [facility] [employer] [facility] # [phone]   XARELTO 20 MG DAILY COVER- YES CO-PAY- $ 386.27 TIER- 2 DRUG PRIOR APPROVAL- NO  DEDUCTIBLE : NOT MET  PREFERRED PHARMACY :  YES    [facility]

## 2017-11-16 NOTE — Telephone Encounter (Signed)
Pt went to ED

## 2017-11-16 NOTE — ED Provider Notes (Addendum)
[facility] EMERGENCY DEPARTMENT Provider Note   CSN: [id number] Arrival date & time: 11/15/17  1725     History   Chief Complaint Chief Complaint  Patient presents with  . Weakness    HPI Carlos Harrison is a 57 y.o. male.  HPI Patient presents to the emergency department with weakness and shortness of breath along with swelling in his lower extremities.  Patient states that this has been an ongoing issue and getting worse.  The patient states that he has seen a cardiologist about this.  The patient states nothing seems to make the condition better.  The patient states that nothing has changed with his medication regimen.  Patient states that he was seen by his doctor and sent to the ER for admission due to heart failure.  The patient denies chest pain,  headache,blurred vision, neck pain, fever, cough, numbness, dizziness, anorexia, edema, abdominal pain, nausea, vomiting, diarrhea, rash, back pain, dysuria, hematemesis, bloody stool, near syncope, or syncope. Past Medical History:  Diagnosis Date  . Palpitations 10/26/2017    Patient Active Problem List   Diagnosis Date Noted  . Acute systolic (congestive) heart failure ([place]) 11/15/2017  . Chronic kidney disease (CKD), stage II (mild) 11/15/2017  . Nausea & vomiting 11/15/2017  . Atrial flutter with rapid ventricular response ([place]) 10/26/2017  . Tachycardia 10/26/2017  . Prediabetes 04/27/2017  . Hypertension 02/21/2017  . Hyperlipidemia 02/21/2017  . Sacroiliac joint dysfunction of right side 07/30/2014    Past Surgical History:  Procedure Laterality Date  . EYE SURGERY Left         Home Medications    Prior to Admission medications   Medication Sig Start Date End Date Taking? Authorizing Provider  apixaban (ELIQUIS) 5 MG TABS tablet Take 5 mg by mouth 2 (two) times daily.   Yes [provider]  metoprolol tartrate (LOPRESSOR) 50 MG tablet Take 1 tablet (50 mg total) by mouth 2 (two)  times daily. 11/02/17  Yes Josue Hector, MD  rosuvastatin (CRESTOR) 10 MG tablet TAKE 1 TABLET BY MOUTH EVERY DAY 10/24/17  Yes Burns, Claudina Lick, MD  telmisartan (MICARDIS) 40 MG tablet Take 1 tablet (40 mg total) by mouth daily. 11/04/17  Yes Biagio Borg, MD  rivaroxaban (XARELTO) 20 MG TABS tablet Take 1 tablet (20 mg total) by mouth daily with supper. Patient not taking: Reported on 11/15/2017 11/11/17   [name], MD    Family History Family History  Problem Relation Age of Onset  . Ovarian cancer Mother     Social History Social History   Tobacco Use  . Smoking status: Never Smoker  . Smokeless tobacco: Never Used  Substance Use Topics  . Alcohol use: No  . Drug use: No     Allergies   Patient has no known allergies.   Review of Systems Review of Systems All other systems negative except as documented in the HPI. All pertinent positives and negatives as reviewed in the HPI.   Physical Exam Updated Vital Signs BP (!) 129/102   Pulse (!) 134   Temp 97.7 F (36.5 C) (Oral)   Resp 17   Ht 6' (1.829 m)   Wt 108.8 kg   SpO2 98%   BMI 32.53 kg/m   Physical Exam  Constitutional: He is oriented to person, place, and time. He appears well-developed and well-nourished. No distress.  HENT:  Head: Normocephalic and atraumatic.  Mouth/Throat: Oropharynx is clear and moist.  Eyes: Pupils  are equal, round, and reactive to light.  Neck: Normal range of motion. Neck supple.  Cardiovascular: Normal rate, regular rhythm and normal heart sounds. Exam reveals no gallop and no friction rub.  No murmur heard. Pulmonary/Chest: Effort normal and breath sounds normal. No respiratory distress. He has no wheezes.  Abdominal: Soft. Bowel sounds are normal. He exhibits no distension. There is no tenderness.  Neurological: He is alert and oriented to person, place, and time. He exhibits normal muscle tone. Coordination normal.  Skin: Skin is warm and dry. Capillary refill takes less  than 2 seconds. No rash noted. No erythema.  Psychiatric: He has a normal mood and affect. His behavior is normal.  Nursing note and vitals reviewed.    ED Treatments / Results  Labs (all labs ordered are listed, but only abnormal results are displayed) Labs Reviewed  BASIC METABOLIC PANEL - Abnormal; Notable for the following components:      Result Value   CO2 21 (*)    Glucose, Bld 107 (*)    BUN 27 (*)    Creatinine, Ser 1.36 (*)    GFR calc non Af Amer 57 (*)    All other components within normal limits  URINALYSIS, ROUTINE W REFLEX MICROSCOPIC - Abnormal; Notable for the following components:   APPearance HAZY (*)    Ketones, ur 20 (*)    Protein, ur 30 (*)    Bacteria, UA RARE (*)    All other components within normal limits  BRAIN NATRIURETIC PEPTIDE - Abnormal; Notable for the following components:   B Natriuretic Peptide 640.2 (*)    All other components within normal limits  CBC  LIPASE, BLOOD  HEPATIC FUNCTION PANEL  HIV ANTIBODY (ROUTINE TESTING)  BASIC METABOLIC PANEL  CBC  I-STAT TROPONIN, ED    EKG None  Radiology Dg Chest 2 View  Result Date: 11/15/2017 CLINICAL DATA:  Intermittent shortness of breath for 1.0-1.5 weeks, history of palpitations, hypertension EXAM: CHEST - 2 VIEW COMPARISON:  01/25/2009 FINDINGS: Enlargement of cardiac silhouette. Mediastinal contours and pulmonary vascularity normal. Lungs clear. No pulmonary infiltrate, pleural effusion or pneumothorax. Osseous structures unremarkable. IMPRESSION: Enlargement of cardiac silhouette. Otherwise negative exam. Electronically Signed   By: Lavonia Dana M.D.   On: 11/15/2017 18:22   Ct Angio Chest Pe W/cm &/or Wo Cm  Result Date: 11/15/2017 CLINICAL DATA:  Weakness and fatigue with intermittent dyspnea. One episode of vomiting today. No reported chest pain. EXAM: CT ANGIOGRAPHY CHEST WITH CONTRAST TECHNIQUE: Multidetector CT imaging of the chest was performed using the standard protocol during  bolus administration of intravenous contrast. Multiplanar CT image reconstructions and MIPs were obtained to evaluate the vascular anatomy. CONTRAST:  166mL ISOVUE-370 IOPAMIDOL (ISOVUE-370) INJECTION 76% COMPARISON:  Same day CXR FINDINGS: Cardiovascular: Cardiomegaly is noted with right atrial enlargement. No pericardial effusion is seen. Satisfactory opacification of the pulmonary arteries without acute pulmonary embolus to the segmental level. Reflux of contrast is noted into the hepatic veins. No dilatation of main pulmonary artery to suggest pulmonary hypertension as an etiology. Nonaneurysmal thoracic aorta. Mediastinum/Nodes: No enlarged mediastinal, hilar, or axillary lymph nodes. Thyroid gland, trachea, and esophagus demonstrate no significant findings. Lungs/Pleura: Mild bilateral diffuse peribronchial thickening. No pulmonary consolidation, effusion or pneumothorax. No dominant mass. Upper Abdomen: No acute abnormality. Musculoskeletal: No chest wall abnormality. No acute or significant osseous findings. Review of the MIP images confirms the above findings. IMPRESSION: No acute pulmonary embolus. Cardiomegaly with right atrial enlargement and reflux of contrast into the  right hepatic veins raising suspicion possible right heart failure. Diffuse mild peribronchial thickening without acute pulmonary consolidation. Electronically Signed   By: Ashley Royalty M.D.   On: 11/15/2017 23:05    Procedures Procedures (including critical care time)  Medications Ordered in ED Medications  iopamidol (ISOVUE-370) 76 % injection (has no administration in time range)  metoprolol tartrate (LOPRESSOR) tablet 50 mg (50 mg Oral Given 11/16/17 0019)  rosuvastatin (CRESTOR) tablet 10 mg (has no administration in time range)  acetaminophen (TYLENOL) tablet 650 mg (has no administration in time range)    Or  acetaminophen (TYLENOL) suppository 650 mg (has no administration in time range)  ondansetron (ZOFRAN) tablet 4  mg (has no administration in time range)    Or  ondansetron (ZOFRAN) injection 4 mg (has no administration in time range)  hydrALAZINE (APRESOLINE) injection 5 mg (has no administration in time range)  zolpidem (AMBIEN) tablet 5 mg (has no administration in time range)  amLODipine (NORVASC) tablet 5 mg (5 mg Oral Given 11/16/17 0019)  levalbuterol (XOPENEX) nebulizer solution 1.25 mg (has no administration in time range)  rivaroxaban (XARELTO) tablet 20 mg (has no administration in time range)  iopamidol (ISOVUE-370) 76 % injection 100 mL (100 mLs Intravenous Contrast Given 11/15/17 2241)     Initial Impression / Assessment and Plan / ED Course  I have reviewed the triage vital signs and the nursing notes.  Pertinent labs & imaging results that were available during my care of the patient were reviewed by me and considered in my medical decision making (see chart for details).     The patient was sent here by their doctor for further evaluation and care of this recently discovered heart failure.  Patient is having symptomatic issues with it as well.  The patient will be admitted to the hospital for further evaluation and care.  He has been stable otherwise here in the emergency department other than tachycardia it appears to be a sinus tachycardia versus atrial fibrillation or a flutter.  Patient will be admitted to the hospital for further evaluation and care  Final Clinical Impressions(s) / ED Diagnoses   Final diagnoses:  Tachycardia  Shortness of breath    ED Discharge Orders    None       Rebeca Allegra 11/16/17 Theressa Millard, MD 11/17/17 1116    Lawyer, [place], [name] 11/25/17 1320    Lacretia Leigh, MD 11/25/17 [phone]

## 2017-11-17 ENCOUNTER — Telehealth: Payer: Self-pay | Admitting: *Deleted

## 2017-11-17 ENCOUNTER — Encounter (HOSPITAL_COMMUNITY): Payer: Self-pay | Admitting: General Practice

## 2017-11-17 DIAGNOSIS — R945 Abnormal results of liver function studies: Secondary | ICD-10-CM

## 2017-11-17 DIAGNOSIS — I5043 Acute on chronic combined systolic (congestive) and diastolic (congestive) heart failure: Secondary | ICD-10-CM | POA: Diagnosis not present

## 2017-11-17 DIAGNOSIS — I4892 Unspecified atrial flutter: Secondary | ICD-10-CM | POA: Diagnosis not present

## 2017-11-17 DIAGNOSIS — R7989 Other specified abnormal findings of blood chemistry: Secondary | ICD-10-CM

## 2017-11-17 MED ORDER — METOPROLOL TARTRATE 25 MG PO TABS: 25.0000 mg | ORAL_TABLET | Freq: Two times a day (BID) | ORAL | 4 refills | Status: DC

## 2017-11-17 NOTE — Progress Notes (Signed)
Progress Note  Patient Name: Carlos Harrison Date of Encounter: 11/17/2017  Primary Cardiologist: Jenkins Rouge, MD   Subjective   57 year old gentleman who was admitted with rapid atrial flutter and acute on chronic systolic congestive heart failure.  He is status post TEE cardioversion yesterday.  He is feeling quite a bit better.  Inpatient Medications    Scheduled Meds: . irbesartan  150 mg Oral Daily  . metoprolol tartrate  50 mg Oral BID  . rivaroxaban  20 mg Oral Q supper  . rosuvastatin  10 mg Oral Daily   Continuous Infusions:  PRN Meds: acetaminophen **OR** acetaminophen, hydrALAZINE, ondansetron **OR** ondansetron (ZOFRAN) IV, zolpidem   Vital Signs    Vitals:   11/17/17 0400 11/17/17 0500 11/17/17 0600 11/17/17 0816  BP: 117/79 (!) 119/96 109/76 98/63  Pulse: 62  72 73  Resp: 13 13 13 18   Temp: 98.2 F (36.8 C)   97.8 F (36.6 C)  TempSrc: Oral     SpO2: 100% 100% 100% 99%  Weight: 107 kg     Height:        Intake/Output Summary (Last 24 hours) at 11/17/2017 0830 Last data filed at 11/16/2017 2100 Gross per 24 hour  Intake 920 ml  Output 750 ml  Net 170 ml   Filed Weights   11/16/17 0309 11/16/17 1116 11/17/17 0400  Weight: 108.2 kg 108.2 kg 107 kg    Telemetry     NSR  - Personally Reviewed  ECG     NSR  - Personally Reviewed  Physical Exam   GEN:  Middle-aged gentleman, no acute distress Neck: No JVD Cardiac: RRR, no murmurs, rubs, or gallops.  Respiratory: Clear to auscultation bilaterally. GI: Soft, nontender, non-distended  MS: No edema; No deformity. Neuro:  Nonfocal  Psych: Normal affect   Labs    Chemistry Recent Labs  Lab 11/15/17 1744 11/16/17 0037  NA 139 138  K 4.2 4.0  CL 107 107  CO2 21* 20*  GLUCOSE 107* 132*  BUN 27* 25*  CREATININE 1.36* 1.19  CALCIUM 9.0 8.7*  PROT  --  5.9*  ALBUMIN  --  3.4*  AST  --  83*  ALT  --  107*  ALKPHOS  --  47  BILITOT  --  1.7*  GFRNONAA 57* >60  GFRAA >60 >60    ANIONGAP 11 11     Hematology Recent Labs  Lab 11/15/17 1744 11/16/17 0037  WBC 5.8 5.2  RBC 4.76 4.60  HGB 13.4 13.0  HCT 42.2 40.1  MCV 88.7 87.2  MCH 28.2 28.3  MCHC 31.8 32.4  RDW 14.4 14.5  PLT 260 226    Cardiac Enzymes Recent Labs  Lab 11/16/17 0759 11/16/17 1343 11/16/17 2050  TROPONINI <0.03 <0.03 <0.03    Recent Labs  Lab 11/15/17 2027  TROPIPOC 0.00     BNP Recent Labs  Lab 11/15/17 2024  BNP 640.2*     DDimer No results for input(s): DDIMER in the last 168 hours.   Radiology    Dg Chest 2 View  Result Date: 11/15/2017 CLINICAL DATA:  Intermittent shortness of breath for 1.0-1.5 weeks, history of palpitations, hypertension EXAM: CHEST - 2 VIEW COMPARISON:  01/25/2009 FINDINGS: Enlargement of cardiac silhouette. Mediastinal contours and pulmonary vascularity normal. Lungs clear. No pulmonary infiltrate, pleural effusion or pneumothorax. Osseous structures unremarkable. IMPRESSION: Enlargement of cardiac silhouette. Otherwise negative exam. Electronically Signed   By: Lavonia Dana M.D.   On: 11/15/2017 18:22  Ct Angio Chest Pe W/cm &/or Wo Cm  Result Date: 11/15/2017 CLINICAL DATA:  Weakness and fatigue with intermittent dyspnea. One episode of vomiting today. No reported chest pain. EXAM: CT ANGIOGRAPHY CHEST WITH CONTRAST TECHNIQUE: Multidetector CT imaging of the chest was performed using the standard protocol during bolus administration of intravenous contrast. Multiplanar CT image reconstructions and MIPs were obtained to evaluate the vascular anatomy. CONTRAST:  112mL ISOVUE-370 IOPAMIDOL (ISOVUE-370) INJECTION 76% COMPARISON:  Same day CXR FINDINGS: Cardiovascular: Cardiomegaly is noted with right atrial enlargement. No pericardial effusion is seen. Satisfactory opacification of the pulmonary arteries without acute pulmonary embolus to the segmental level. Reflux of contrast is noted into the hepatic veins. No dilatation of main pulmonary artery to  suggest pulmonary hypertension as an etiology. Nonaneurysmal thoracic aorta. Mediastinum/Nodes: No enlarged mediastinal, hilar, or axillary lymph nodes. Thyroid gland, trachea, and esophagus demonstrate no significant findings. Lungs/Pleura: Mild bilateral diffuse peribronchial thickening. No pulmonary consolidation, effusion or pneumothorax. No dominant mass. Upper Abdomen: No acute abnormality. Musculoskeletal: No chest wall abnormality. No acute or significant osseous findings. Review of the MIP images confirms the above findings. IMPRESSION: No acute pulmonary embolus. Cardiomegaly with right atrial enlargement and reflux of contrast into the right hepatic veins raising suspicion possible right heart failure. Diffuse mild peribronchial thickening without acute pulmonary consolidation. Electronically Signed   By: Ashley Royalty M.D.   On: 11/15/2017 23:05    Cardiac Studies     Patient Profile     57 y.o. male with rapid atrial flutter and subsequent rate related acute systolic congestive heart failure:  Assessment & Plan    1.  Atrial flutter:   Transesophageal echo revealed no evidence of left atrial thrombus and subsequently  Patient was cardioverted yesterday. He is feeling quite a bit better.  He is on Xarelto 20 mg a day. His blood pressure is a little low.  We will decrease the metoprolol to 25 mg twice a day. I encouraged the wife to get a blood pressure cuff.  He is to measure his heart rate and blood pressure on a daily basis and will bring the blood pressure log to his office visits.  2.  Acute systolic congestive heart failure: The patient has an ejection fraction of around 15%.  It is likely that this is a rate related congestive heart failure.  He should improve after cardioversion and with control of his heart rate. Continue the metoprolol at 25 mg twice a day.  His blood pressure is fairly marginal but as his blood pressure increases, will consider starting Entresto.  We have  stopped his amlodipine.  Had a long discussion about low-salt diet. Anticipate repeat echo in 4-6 weeks .   Patient will need a transition of care visit in the next 7 to 10 days. He will need a note to return to work on August 26. He will need a patient assistance voucher to help with the cost of the Xarelto.   Our office should be able to assist with that.      For questions or updates, please contact [place] Please consult [email or web address] for contact info under Cardiology/STEMI.      Signed, Mertie Moores, MD  11/17/2017, 8:30 AM

## 2017-11-17 NOTE — Telephone Encounter (Addendum)
Left patient a message to call me back for interest in [facility] patient assistance program. I told him I will mail him his part of the application today. I reminded him of his appt here with Daune Perch, NP on 11/29/17 @ 10am.  Placed provider part of application in Dr Lanny Hurst box, he works on 11/21/2017.

## 2017-11-17 NOTE — Discharge Summary (Addendum)
Discharge Summary    Patient ID: Carlos Harrison,  MRN: [id number], DOB/AGE: 1961/01/02 57 y.o.  Admit date: 11/15/2017 Discharge date: 11/17/2017  Primary Care Provider: No primary care provider on file. Primary Cardiologist: Jenkins Rouge, MD  Discharge Diagnoses    Principal Problem:   Atrial flutter with rapid ventricular response [facility]) Active Problems:   Hypertension   Hyperlipidemia   Acute systolic (congestive) heart failure (HCC)   Chronic kidney disease (CKD), stage II (mild)   Nausea & vomiting   Elevated LFTs  Allergies No Known Allergies  Diagnostic Studies/Procedures    TEE/echocardiogram with cardioversion 11/16/2017: Study Conclusions  - Left ventricle: The cavity size was mildly dilated. The estimated ejection fraction was in the range of 15% to 20%. There was severe spontaneous echo contrast, indicative of stasis. No evidence of thrombus. - Aortic valve: There was trivial regurgitation. - Mitral valve: There was mild to moderate regurgitation. - Left atrium: No evidence of thrombus in the atrial cavity or appendage.  Impressions:  - Successful cardioversion. No cardiac source of emboli was indentified.   History of Present Illness     Carlos Harrison is a 57 year old male with a past medical history significant for hypertension, hyperlipidemia, atrial flutter on Eliquis, CKD stage II, CHF with LVEF of 10% who presented to [facility] on 11/15/2017 with shortness of breath, generalized weakness, nausea, vomiting and abdominal pain.  Patient reported the development of nausea on day of admission while working.  He stated that this lasted approximately 1 minute and was followed by 3-4 episodes of emesis.  The nausea was accompanied by significant weakness over the last several days.  He had been recently evaluated Dr. Johnsie Cancel for evaluation of newly diagnosed atrial flutter.  TEE at that time demonstrated LVEF of 10 to 15% with no prior study  available.  He was placed on apixaban (samples) and missed several doses over the last week.  He was additionally placed on metoprolol 50 mg twice daily for rate control. CHA2DS2VASc =2  Hospital Course     Upon arrival to the ED, creatinine was noted to be elevated at 1.37, increased from 1.23.  BNP was noted to be 640.  Troponin levels negative x3.  Given his presenting rate and rhythm, it was recommended that he undergo a TEE-DCCV.  On 11/16/2017, patient underwent a successful TEE-DCCV to NSR with an estimated LVEF of 15 to 20% and no cardiac source of emboli identified.  There was severe spontaneous echo contrast, indicative of stasis.  Postprocedural EKG with NSR and diffuse nonspecific T wave inversions. He remains on Xarelto 20 mg/day.  On day of discharge, BP a little low, therefore metoprolol was decreased to 25 mg twice daily. Consider adding Entresto once BP normalizes given reduced systolic function. Will hold Micardis until follow up appointment and add back once BP stable   Consultants: None   Other hospital problems include:  Acute on chronic systolic heart failure: -Pt had a presentation consistent with worsening of his chronic systolic heart failure, likely due to uncontrolled arrythmia. However the underlying etiology of his cardiomyopathy has not been identified. He certainly has risk factors for coronary artery disease and this will need to be investigated after his rhythm has been controlled.  His blood pressures fairly marginal but as this increases, consider starting Entresto. His amlodipine has been discontinued.   -Will need repeat echocardiogram in 4 to 6 weeks  Hyperlipidemia: -Continue rosuvastatin.   Elevated liver enzymes: -Will need repeat  LFT's at follow up   Pt was seen by Dr. Acie Fredrickson who feels that pt is stable for discharge. Follow appointments have been made and all discharge medications are listed below.  ___________  Discharge Vitals Blood pressure  117/87, pulse 75, temperature 97.7 F (36.5 C), temperature source Oral, resp. rate 16, height 6\' 3"  (1.905 m), weight 107 kg, SpO2 98 %.  Filed Weights   11/16/17 0309 11/16/17 1116 11/17/17 0400  Weight: 108.2 kg 108.2 kg 107 kg   Labs & Radiologic Studies    CBC Recent Labs    11/15/17 1744 11/16/17 0037  WBC 5.8 5.2  HGB 13.4 13.0  HCT 42.2 40.1  MCV 88.7 87.2  PLT 260 628   Basic Metabolic Panel Recent Labs    11/15/17 1744 11/16/17 0037  NA 139 138  K 4.2 4.0  CL 107 107  CO2 21* 20*  GLUCOSE 107* 132*  BUN 27* 25*  CREATININE 1.36* 1.19  CALCIUM 9.0 8.7*   Liver Function Tests Recent Labs    11/16/17 0037  AST 83*  ALT 107*  ALKPHOS 47  BILITOT 1.7*  PROT 5.9*  ALBUMIN 3.4*   Recent Labs    11/16/17 0037  LIPASE 28   Cardiac Enzymes Recent Labs    11/16/17 0759 11/16/17 1343 11/16/17 2050  TROPONINI <0.03 <0.03 <0.03   _____________  Dg Chest 2 View  Result Date: 11/15/2017 CLINICAL DATA:  Intermittent shortness of breath for 1.0-1.5 weeks, history of palpitations, hypertension EXAM: CHEST - 2 VIEW COMPARISON:  01/25/2009 FINDINGS: Enlargement of cardiac silhouette. Mediastinal contours and pulmonary vascularity normal. Lungs clear. No pulmonary infiltrate, pleural effusion or pneumothorax. Osseous structures unremarkable. IMPRESSION: Enlargement of cardiac silhouette. Otherwise negative exam. Electronically Signed   By: Lavonia Dana M.D.   On: 11/15/2017 18:22   Ct Angio Chest Pe W/cm &/or Wo Cm  Result Date: 11/15/2017 CLINICAL DATA:  Weakness and fatigue with intermittent dyspnea. One episode of vomiting today. No reported chest pain. EXAM: CT ANGIOGRAPHY CHEST WITH CONTRAST TECHNIQUE: Multidetector CT imaging of the chest was performed using the standard protocol during bolus administration of intravenous contrast. Multiplanar CT image reconstructions and MIPs were obtained to evaluate the vascular anatomy. CONTRAST:  116mL ISOVUE-370  IOPAMIDOL (ISOVUE-370) INJECTION 76% COMPARISON:  Same day CXR FINDINGS: Cardiovascular: Cardiomegaly is noted with right atrial enlargement. No pericardial effusion is seen. Satisfactory opacification of the pulmonary arteries without acute pulmonary embolus to the segmental level. Reflux of contrast is noted into the hepatic veins. No dilatation of main pulmonary artery to suggest pulmonary hypertension as an etiology. Nonaneurysmal thoracic aorta. Mediastinum/Nodes: No enlarged mediastinal, hilar, or axillary lymph nodes. Thyroid gland, trachea, and esophagus demonstrate no significant findings. Lungs/Pleura: Mild bilateral diffuse peribronchial thickening. No pulmonary consolidation, effusion or pneumothorax. No dominant mass. Upper Abdomen: No acute abnormality. Musculoskeletal: No chest wall abnormality. No acute or significant osseous findings. Review of the MIP images confirms the above findings. IMPRESSION: No acute pulmonary embolus. Cardiomegaly with right atrial enlargement and reflux of contrast into the right hepatic veins raising suspicion possible right heart failure. Diffuse mild peribronchial thickening without acute pulmonary consolidation. Electronically Signed   By: Ashley Royalty M.D.   On: 11/15/2017 23:05   Disposition   Pt is being discharged home today in good condition.  Follow-up Plans & Appointments    Follow-up Information    Daune Perch, NP Follow up on 11/29/2017.   Specialty:  Nurse Practitioner Why:  Your appointment will be on 11/29/17  at [employer] information: [place] [address] [phone]          Discharge Instructions    ([place]) Call MD:  Anytime you have any of the following symptoms: 1) 3 pound weight gain in 24 hours or 5 pounds in 1 week 2) shortness of breath, with or without a dry hacking cough 3) swelling in the hands, feet or stomach 4) if you have to sleep on extra pillows at night in order to  breathe.   Complete by:  As directed    Call MD for:  difficulty breathing, headache or visual disturbances   Complete by:  As directed    Call MD for:  persistant dizziness or light-headedness   Complete by:  As directed    Call MD for:  persistant nausea and vomiting   Complete by:  As directed    Call MD for:  redness, tenderness, or signs of infection (pain, swelling, redness, odor or green/yellow discharge around incision site)   Complete by:  As directed    Call MD for:  severe uncontrolled pain   Complete by:  As directed    Diet - low sodium heart healthy   Complete by:  As directed    Discharge instructions   Complete by:  As directed    If you notice any bleeding such as blood in stool, black tarry stools, blood in urine, nosebleeds or any other unusual bleeding, call your doctor immediately.   Increase activity slowly   Complete by:  As directed       Discharge Medications   Allergies as of 11/17/2017   No Known Allergies     Medication List    STOP taking these medications   ELIQUIS 5 MG Tabs tablet Generic drug:  apixaban   telmisartan 40 MG tablet Commonly known as:  MICARDIS     TAKE these medications   metoprolol tartrate 25 MG tablet Commonly known as:  LOPRESSOR Take 1 tablet (25 mg total) by mouth 2 (two) times daily. What changed:    medication strength  how much to take Notes to patient:  Decreases the work of the heart Lowers blood pressure and heart rate Decreases force of contraction of heart muscle Controls heart rhythm - atrial fibrillation    rivaroxaban 20 MG Tabs tablet Commonly known as:  XARELTO Take 1 tablet (20 mg total) by mouth daily with supper. Notes to patient:  Blood thinner Prevents clotting in heart chamber and stroke    rosuvastatin 10 MG tablet Commonly known as:  CRESTOR TAKE 1 TABLET BY MOUTH EVERY DAY Notes to patient:  Lowers bad cholesterol and triglycerides Increases good cholesterol         Outstanding  Labs/Studies   Will need echocardiogram in 4 to 6 weeks to follow-up LVEF  Patient assistance for Xarelto therapy   LFT's at follow up appointment   Duration of Discharge Encounter   Greater than 30 minutes including physician time.  Signed, Kathyrn Drown NP-C [place] Pager: [phone] 11/17/2017, 2:08 PM  Attending Note:   The patient was seen and examined.  Agree with assessment and plan as noted above.  Changes made to the above note as needed.  Patient seen and independently examined with  Cecilie Kicks, NP .   We discussed all aspects of the encounter. I agree with the assessment and plan as stated above.  1.  Atrial flutter:   Transesophageal echo revealed no evidence of  left atrial thrombus and subsequently  Patient was cardioverted yesterday. He is feeling quite a bit better.  He is on Xarelto 20 mg a day. His blood pressure is a little low.  We will decrease the metoprolol to 25 mg twice a day. I encouraged the wife to get a blood pressure cuff.  He is to measure his heart rate and blood pressure on a daily basis and will bring the blood pressure log to his office visits.  2.  Acute systolic congestive heart failure: The patient has an ejection fraction of around 15%.  It is likely that this is a rate related congestive heart failure.  He should improve after cardioversion and with control of his heart rate. Continue the metoprolol at 25 mg twice a day.  His blood pressure is fairly marginal but as his blood pressure increases, will consider starting Entresto.  We have stopped his amlodipine.  Had a long discussion about low-salt diet. Anticipate repeat echo in 4-6 weeks .   Patient will need a transition of care visit in the next 7 to 10 days. He will need a note to return to work on August 26. He will need a patient assistance voucher to help with the cost of the Xarelto.   Our office should be able to assist with that.    I have spent a total of 40 minutes  with patient reviewing hospital  notes , telemetry, EKGs, labs and examining patient as well as establishing an assessment and plan that was discussed with the patient. > 50% of time was spent in direct patient care.    Thayer Headings, Brooke Bonito., MD, [facility] 11/17/2017, 5:48 PM [address]  [place] Pager [phone]

## 2017-11-17 NOTE — Telephone Encounter (Signed)
-----   Message from Thayer Headings, MD sent at 11/17/2017  9:01 AM EDT ----- Will you call patient and assist with getting Xarelto through a patient assistant program   Thanks  Phil \

## 2017-11-20 ENCOUNTER — Telehealth: Payer: Self-pay | Admitting: Physician Assistant

## 2017-11-20 NOTE — Telephone Encounter (Signed)
Carlos Harrison called because he has been checking his blood pressure at home and it has been elevated.  Systolic has been 783-754W and diastolic has frequently been over 100.  He checked his blood pressure over an hour after he took his morning medications and he was still 150/110.  Requested he increase the metoprolol to 50 mg twice daily.  He states his weight has been stable and he is weighing daily.  He is not waking with lower extremity edema and denies orthopnea or PND.  He feels he has been doing very well since he has been home and is working on tracking his blood pressure and eating a low-sodium diet.  Reinforced the need to continue this and keep follow-up appointments.  Call back if his blood pressure does not improve.  Rosaria Ferries, [name] 11/20/2017 10:48 AM [place] [phone]

## 2017-11-21 ENCOUNTER — Telehealth: Payer: Self-pay | Admitting: Cardiovascular Disease

## 2017-11-21 DIAGNOSIS — Z79899 Other long term (current) drug therapy: Secondary | ICD-10-CM

## 2017-11-21 DIAGNOSIS — I5021 Acute systolic (congestive) heart failure: Secondary | ICD-10-CM

## 2017-11-21 NOTE — Telephone Encounter (Signed)
Left message for patient to call back  

## 2017-11-21 NOTE — Telephone Encounter (Signed)
Called patient about his message.  Informed patient that he would not need to come in for lab work since he had his cardioversion last week. Patient also concerned about his BP being elevated SBP 140's and DBP 91 to 107.  Patient stated his HR has been in the 70's. Patient recently had an increase in metoprolol to 50 mg BID. Patient stated he has been feeling fine and denies SOB or Chest pain. Will forward to Dr. Johnsie Cancel for further advisement.

## 2017-11-21 NOTE — Telephone Encounter (Signed)
New Message   Pt states he has already had his cardio version and wants to know if he still needs his blood work done tomorrow since that what it was for. Please call

## 2017-11-21 NOTE — Telephone Encounter (Signed)
Dr Acie Fredrickson has signed the provider part of the application. I have placed it in the yellow folder in the PA dept awaiting the pts part.

## 2017-11-21 NOTE — Telephone Encounter (Signed)
Needs to start entresto lowest dose and titrate up have him see pharm D or CHF clinic BMET was ok in hospital to start

## 2017-11-22 ENCOUNTER — Other Ambulatory Visit: Payer: BLUE CROSS/BLUE SHIELD

## 2017-11-22 MED ORDER — SACUBITRIL-VALSARTAN 24-26 MG PO TABS: 1.0000 | ORAL_TABLET | Freq: Two times a day (BID) | ORAL | 0 refills | Status: DC

## 2017-11-22 MED FILL — ENTRESTO 24 MG-26 MG TABLET: 24-26 | 30 days supply | Qty: 60 | Fill #0

## 2017-11-22 NOTE — Telephone Encounter (Signed)
Left message for patient to call back  

## 2017-11-22 NOTE — Telephone Encounter (Signed)
Patient called back. Informed patient of Dr. Kyla Balzarine recommendations, needs to start entresto lowest dose and titrate up, have him see pharm D or CHF clinic. BMET was okay in hospital to start. Patient give 30 day free trail card and $10 co pay card. Patient will come pick up card. Sent to [facility] and patient has f/u with our pharm D on 12/15/17 for titration.

## 2017-11-24 ENCOUNTER — Encounter (HOSPITAL_COMMUNITY): Payer: Self-pay | Admitting: Anesthesiology

## 2017-11-24 ENCOUNTER — Ambulatory Visit (HOSPITAL_COMMUNITY)
Admission: RE | Admit: 2017-11-24 | Payer: BLUE CROSS/BLUE SHIELD | Source: Ambulatory Visit | Admitting: Cardiovascular Disease

## 2017-11-24 ENCOUNTER — Encounter (HOSPITAL_COMMUNITY): Admission: RE | Payer: Self-pay | Source: Ambulatory Visit

## 2017-11-24 SURGERY — CARDIOVERSION
Anesthesia: General

## 2017-11-29 ENCOUNTER — Encounter: Payer: Self-pay | Admitting: Cardiology

## 2017-11-29 ENCOUNTER — Ambulatory Visit (INDEPENDENT_AMBULATORY_CARE_PROVIDER_SITE_OTHER): Payer: BLUE CROSS/BLUE SHIELD | Admitting: Cardiology

## 2017-11-29 VITALS — BP 102/70 | HR 63 | Ht 75.5 in | Wt 222.1 lb

## 2017-11-29 DIAGNOSIS — I4892 Unspecified atrial flutter: Secondary | ICD-10-CM

## 2017-11-29 DIAGNOSIS — I5021 Acute systolic (congestive) heart failure: Secondary | ICD-10-CM | POA: Diagnosis not present

## 2017-11-29 DIAGNOSIS — I420 Dilated cardiomyopathy: Secondary | ICD-10-CM

## 2017-11-29 DIAGNOSIS — E785 Hyperlipidemia, unspecified: Secondary | ICD-10-CM

## 2017-11-29 DIAGNOSIS — I10 Essential (primary) hypertension: Secondary | ICD-10-CM

## 2017-11-29 MED ORDER — METOPROLOL SUCCINATE ER 100 MG PO TB24: 100.0000 mg | ORAL_TABLET | Freq: Every day | ORAL | 3 refills | Status: DC

## 2017-11-29 NOTE — Patient Instructions (Addendum)
Medication Instructions: STOP: Metoprolol tartrate   START: Metoprolol Succinate (Torolol XL) 100 mg daily   Labwork: None Ordered  Procedures/Testing: None Ordered  Follow-Up: Keep your follow up appointment with Dr.Nishan on 10/28 @ 10:45 AM  Keep appointment with pharmacy on 9/19 @ 3:00 PM   Any Additional Special Instructions Will Be Listed Below (If Applicable).   DASH Eating Plan DASH stands for "Dietary Approaches to Stop Hypertension." The DASH eating plan is a healthy eating plan that has been shown to reduce high blood pressure (hypertension). It may also reduce your risk for type 2 diabetes, heart disease, and stroke. The DASH eating plan may also help with weight loss. What are tips for following this plan? General guidelines  Avoid eating more than 2,300 mg (milligrams) of salt (sodium) a day. If you have hypertension, you may need to reduce your sodium intake to 1,500 mg a day.  Limit alcohol intake to no more than 1 drink a day for nonpregnant women and 2 drinks a day for men. One drink equals 12 oz of beer, 5 oz of wine, or 1 oz of hard liquor.  Work with your health care provider to maintain a healthy body weight or to lose weight. Ask what an ideal weight is for you.  Get at least 30 minutes of exercise that causes your heart to beat faster (aerobic exercise) most days of the week. Activities may include walking, swimming, or biking.  Work with your health care provider or diet and nutrition specialist (dietitian) to adjust your eating plan to your individual calorie needs. Reading food labels  Check food labels for the amount of sodium per serving. Choose foods with less than 5 percent of the Daily Value of sodium. Generally, foods with less than 300 mg of sodium per serving fit into this eating plan.  To find whole grains, look for the word "whole" as the first word in the ingredient list. Shopping  Buy products labeled as "low-sodium" or "no salt  added."  Buy fresh foods. Avoid canned foods and premade or frozen meals. Cooking  Avoid adding salt when cooking. Use salt-free seasonings or herbs instead of table salt or sea salt. Check with your health care provider or pharmacist before using salt substitutes.  Do not fry foods. Cook foods using healthy methods such as baking, boiling, grilling, and broiling instead.  Cook with heart-healthy oils, such as olive, canola, soybean, or sunflower oil. Meal planning   Eat a balanced diet that includes: ? 5 or more servings of fruits and vegetables each day. At each meal, try to fill half of your plate with fruits and vegetables. ? Up to 6-8 servings of whole grains each day. ? Less than 6 oz of lean meat, poultry, or fish each day. A 3-oz serving of meat is about the same size as a deck of cards. One egg equals 1 oz. ? 2 servings of low-fat dairy each day. ? A serving of nuts, seeds, or beans 5 times each week. ? Heart-healthy fats. Healthy fats called Omega-3 fatty acids are found in foods such as flaxseeds and coldwater fish, like sardines, salmon, and mackerel.  Limit how much you eat of the following: ? Canned or prepackaged foods. ? Food that is high in trans fat, such as fried foods. ? Food that is high in saturated fat, such as fatty meat. ? Sweets, desserts, sugary drinks, and other foods with added sugar. ? Full-fat dairy products.  Do not salt foods before eating.  Try to eat at least 2 vegetarian meals each week.  Eat more home-cooked food and less restaurant, buffet, and fast food.  When eating at a restaurant, ask that your food be prepared with less salt or no salt, if possible. What foods are recommended? The items listed may not be a complete list. Talk with your dietitian about what dietary choices are best for you. Grains Whole-grain or whole-wheat bread. Whole-grain or whole-wheat pasta. Jerome rice. [place]ena Morrow. Bulgur. Whole-grain and low-sodium cereals.  Pita bread. Low-fat, low-sodium crackers. Whole-wheat flour tortillas. Vegetables Fresh or frozen vegetables (raw, steamed, roasted, or grilled). Low-sodium or reduced-sodium tomato and vegetable juice. Low-sodium or reduced-sodium tomato sauce and tomato paste. Low-sodium or reduced-sodium canned vegetables. Fruits All fresh, dried, or frozen fruit. Canned fruit in natural juice (without added sugar). Meat and other protein foods Skinless chicken or [place]. Ground chicken or [place]. Pork with fat trimmed off. Fish and seafood. Egg whites. Dried beans, peas, or lentils. Unsalted nuts, nut butters, and seeds. Unsalted canned beans. Lean cuts of beef with fat trimmed off. Low-sodium, lean deli meat. Dairy Low-fat (1%) or fat-free (skim) milk. Fat-free, low-fat, or reduced-fat cheeses. Nonfat, low-sodium ricotta or cottage cheese. Low-fat or nonfat yogurt. Low-fat, low-sodium cheese. Fats and oils Soft margarine without trans fats. Vegetable oil. Low-fat, reduced-fat, or light mayonnaise and salad dressings (reduced-sodium). Canola, safflower, olive, soybean, and sunflower oils. Avocado. Seasoning and other foods Herbs. Spices. Seasoning mixes without salt. Unsalted popcorn and pretzels. Fat-free sweets. What foods are not recommended? The items listed may not be a complete list. Talk with your dietitian about what dietary choices are best for you. Grains Baked goods made with fat, such as croissants, muffins, or some breads. Dry pasta or rice meal packs. Vegetables Creamed or fried vegetables. Vegetables in a cheese sauce. Regular canned vegetables (not low-sodium or reduced-sodium). Regular canned tomato sauce and paste (not low-sodium or reduced-sodium). Regular tomato and vegetable juice (not low-sodium or reduced-sodium). Angie Fava. Olives. Fruits Canned fruit in a light or heavy syrup. Fried fruit. Fruit in cream or butter sauce. Meat and other protein foods Fatty cuts of meat. Ribs. Fried  meat. Berniece Salines. Sausage. Bologna and other processed lunch meats. Salami. Fatback. Hotdogs. Bratwurst. Salted nuts and seeds. Canned beans with added salt. Canned or smoked fish. Whole eggs or egg yolks. Chicken or [place] with skin. Dairy Whole or 2% milk, cream, and half-and-half. Whole or full-fat cream cheese. Whole-fat or sweetened yogurt. Full-fat cheese. Nondairy creamers. Whipped toppings. Processed cheese and cheese spreads. Fats and oils Butter. Stick margarine. Lard. Shortening. Ghee. Bacon fat. Tropical oils, such as coconut, palm kernel, or palm oil. Seasoning and other foods Salted popcorn and pretzels. Onion salt, garlic salt, seasoned salt, table salt, and sea salt. Worcestershire sauce. Tartar sauce. Barbecue sauce. Teriyaki sauce. Soy sauce, including reduced-sodium. Steak sauce. Canned and packaged gravies. Fish sauce. Oyster sauce. Cocktail sauce. Horseradish that you find on the shelf. Ketchup. Mustard. Meat flavorings and tenderizers. Bouillon cubes. Hot sauce and Tabasco sauce. Premade or packaged marinades. Premade or packaged taco seasonings. Relishes. Regular salad dressings. Where to find more information:  National Heart, Lung, and [place]: https://wilson-eaton.com/  American Heart Association: www.heart.org Summary  The DASH eating plan is a healthy eating plan that has been shown to reduce high blood pressure (hypertension). It may also reduce your risk for type 2 diabetes, heart disease, and stroke.  With the DASH eating plan, you should limit salt (sodium) intake to 2,300 mg a day. If  you have hypertension, you may need to reduce your sodium intake to 1,500 mg a day.  When on the DASH eating plan, aim to eat more fresh fruits and vegetables, whole grains, lean proteins, low-fat dairy, and heart-healthy fats.  Work with your health care provider or diet and nutrition specialist (dietitian) to adjust your eating plan to your individual calorie needs. This information is  not intended to replace advice given to you by your health care provider. Make sure you discuss any questions you have with your health care provider. Document Released: 03/04/2011 Document Revised: 03/08/2016 Document Reviewed: 03/08/2016 Elsevier Interactive Patient Education  [employer].    If you need a refill on your cardiac medications before your next appointment, please call your pharmacy.

## 2017-11-29 NOTE — Progress Notes (Signed)
Cardiology Office Note:    Date:  11/29/2017   ID:  [name], DOB Mar 08, 1961, MRN [id number]  PCP:  [name], MD  Cardiologist:  Jenkins Rouge, MD  Referring MD: No ref. provider found   Chief Complaint  Patient presents with  . Hospitalization Follow-up    atrial flutter and systolic HF    History of Present Illness:    Carlos Harrison is a 57 y.o. male with a past medical history significant for hypertension, hyperlipidemia, atrial flutter on Eliquis, CKD stage II, CHF with LVEF of 10%.  He was recently admitted to the hospital on 11/15/2017 with shortness of breath, generalized weakness, nausea, vomiting and abdominal pain.  The patient was in atrial flutter with RVR on presentation.  Also his creatinine was elevated at 1.37 and BNP was 648.  Troponins were negative x3.  He underwent TEE guided electrical cardioversion on 11/16/2017 and was noted to have an estimated LVEF of 15-20% and no cardiac source of emboli identified.  It was felt that the patient was having worsening chronic systolic heart failure, likely due to uncontrolled arrhythmia.  However with the underlying etiology of his cardiomyopathy not being specifically identified, it was advised to investigate the possibility of coronary artery disease.  His blood pressure was a little low in the hospital.  His amlodipine and Micardis were discontinued and metoprolol dose was decreased to allow for room to add Entresto.  Today he is here for follow up of hospitalization. He works at [employer] in the [employer]. He is quite active. Prior to this arrhythmia episode he was not having any exertional chest pain/pain/tighthenss or shortness of breath. When he developed the rapid aflutter in late July he was having feeling of fast/hard heart beat. He developed shortness of breath that progressed. He was short of breath walking the 3 flights of stairs to his home. He also had weakness and fatigue but no chest  discomfort.   Since his cardioversion a little over a week ago he feels better. His breathing is back to normal. His energy is improving. He is tolerating the medications well with no lightheadedness or dizziness. He has never had any orthopnea, PND or edema. He was diagnosed with hypertension in 07/02/15 after the death of his daughter. His BP has been difficult to control since then.   Past Medical History:  Diagnosis Date  . Atrial fibrillation ([place]) 10/2017  . Cardiomyopathy ([place]) 11/10/2017  . Dyspnea   . Hypertension   . Palpitations 10/26/2017    Past Surgical History:  Procedure Laterality Date  . CARDIOVERSION N/A 11/16/2017   Procedure: CARDIOVERSION;  Surgeon: Acie Fredrickson Wonda Cheng, MD;  Location: [place];  Service: Cardiovascular;  Laterality: N/A;  . EYE SURGERY Left   . TEE WITH CARDIOVERSION  11/16/2017  . TEE WITHOUT CARDIOVERSION N/A 11/16/2017   Procedure: TRANSESOPHAGEAL ECHOCARDIOGRAM (TEE);  Surgeon: Thayer Headings, MD;  Location: [facility] ENDOSCOPY;  Service: Cardiovascular;  Laterality: N/A;    Current Medications: Current Meds  Medication Sig  . rivaroxaban (XARELTO) 20 MG TABS tablet Take 1 tablet (20 mg total) by mouth daily with supper.  . rosuvastatin (CRESTOR) 10 MG tablet TAKE 1 TABLET BY MOUTH EVERY DAY  . sacubitril-valsartan (ENTRESTO) 24-26 MG Take 1 tablet by mouth 2 (two) times daily.  . [DISCONTINUED] metoprolol tartrate (LOPRESSOR) 50 MG tablet Take 50 mg by mouth 2 (two) times daily.     Allergies:   Patient has no known allergies.  Social History   Socioeconomic History  . Marital status: Married    Spouse name: Not on file  . Number of children: 87  . Years of education: 64  . Highest education level: Not on file  Occupational History  . Occupation: [employer]  Social Needs  . Financial resource strain: Not on file  . Food insecurity:    Worry: Not on file    Inability: Not on file  . Transportation needs:    Medical: Not on file     Non-medical: Not on file  Tobacco Use  . Smoking status: Never Smoker  . Smokeless tobacco: Never Used  Substance and Sexual Activity  . Alcohol use: No  . Drug use: No  . Sexual activity: Not on file  Lifestyle  . Physical activity:    Days per week: Not on file    Minutes per session: Not on file  . Stress: Not on file  Relationships  . Social connections:    Talks on phone: Not on file    Gets together: Not on file    Attends religious service: Not on file    Active member of club or organization: Not on file    Attends meetings of clubs or organizations: Not on file    Relationship status: Not on file  Other Topics Concern  . Not on file  Social History Narrative   Fun: Mentoring youth, solitary games.    Denies any religious beliefs effecting health care.      Family History: The patient's family history includes Ovarian cancer in his mother. ROS:   Please see the history of present illness.     All other systems reviewed and are negative.  EKGs/Labs/Other Studies Reviewed:    The following studies were reviewed today:  TEE 11/16/17 Study Conclusions - Left ventricle: The cavity size was mildly dilated. The estimated   ejection fraction was in the range of 15% to 20%. There was   severe spontaneous echo contrast, indicative of stasis. No   evidence of thrombus. - Aortic valve: There was trivial regurgitation. - Mitral valve: There was mild to moderate regurgitation. - Left atrium: No evidence of thrombus in the atrial cavity or   appendage.  Impressions: - Successful cardioversion. No cardiac source of emboli was   indentified.  11/10/17 TTE Study Conclusions - Procedure narrative: Transthoracic echocardiography. Image quality was adequate. Intravenous contrast (Definity) was administered to opacify the LV. - Left ventricle: The cavity size was moderately dilated. Wall thickness was increased in a pattern of mild LVH. There was moderate  concentric hypertrophy. Systolic function was severely reduced. The estimated ejection fraction was in the range of 10% to 15%. Diffuse hypokinesis. The study is not technically sufficient to allow evaluation of LV diastolic function. Acoustic contrast opacification revealed no evidence ofthrombus. - Mitral valve: There was moderate regurgitation. - Left atrium: The atrium was moderately dilated. - Right ventricle: The cavity size was moderately dilated. Wall thickness was normal. - Tricuspid valve: There was moderate regurgitation. - Pulmonary arteries: Systolic pressure was mildly increased. PA peak pressure: 37 mm Hg (S).   EKG:  EKG is not ordered today.    Recent Labs: 10/26/2017: TSH 3.57 11/15/2017: B Natriuretic Peptide 640.2 11/16/2017: ALT 107; BUN 25; Creatinine, Ser 1.19; Hemoglobin 13.0; Platelets 226; Potassium 4.0; Sodium 138   Recent Lipid Panel    Component Value Date/Time   CHOL 148 10/26/2017 1541   TRIG 88.0 10/26/2017 1541   HDL 44.80 10/26/2017  1541   CHOLHDL 3 10/26/2017 1541   VLDL 17.6 10/26/2017 1541   LDLCALC 85 10/26/2017 1541    Physical Exam:    VS:  BP 102/70   Pulse 63   Ht 6' 3.5" (1.918 m)   Wt 222 lb 1.9 oz (100.8 kg)   BMI 27.40 kg/m     Wt Readings from Last 3 Encounters:  11/29/17 222 lb 1.9 oz (100.8 kg)  11/17/17 235 lb 14.3 oz (107 kg)  11/15/17 240 lb (108.9 kg)     Physical Exam  Constitutional: He is oriented to person, place, and time. He appears well-developed and well-nourished. No distress.  HENT:  Head: Normocephalic and atraumatic.  Neck: Normal range of motion. Neck supple. No JVD present.  Cardiovascular: Normal rate, regular rhythm, normal heart sounds and intact distal pulses. Exam reveals no gallop and no friction rub.  No murmur heard. Pulmonary/Chest: Effort normal and breath sounds normal. No respiratory distress. He has no wheezes. He has no rales.  Abdominal: Soft. Bowel sounds are normal.    Musculoskeletal: Normal range of motion. He exhibits no edema.  Neurological: He is alert and oriented to person, place, and time.  Skin: Skin is warm and dry.  Psychiatric: He has a normal mood and affect. His behavior is normal. Judgment and thought content normal.  Vitals reviewed.    ASSESSMENT:    1. Acute systolic (congestive) heart failure ([place])   2. Dilated cardiomyopathy ([place])   3. Atrial flutter, paroxysmal ([place])   4. Essential hypertension   5. Hyperlipidemia, unspecified hyperlipidemia type    PLAN:    In order of problems listed above:  Acute on chronic systolic heart failure: EF 10-15% by echo 11/10/17. Possibly due to A.  Flutter with RVR.  Will consolidate metoprolol.  Entresto 24-26 mg twice daily has been added and he is tolerating it well. No room in BP at present to increase. It looks like he may have had aflutter with RVR since at least late July when found on routine medical exam. He has had no exertional chest discomfort. Shortness of breath was only present with the fast heart rate. The LV dysfunction is likely related to the tachyarrhythmia. Discussed with Dr. Johnsie Cancel. Will have pt seen in HTN clinic in a few weeks to assess for possible room to adjust the Entresto. I will consolidate the metoprolol to Toprol XL 100 mg daily.  Will recheck echo in about 3 months. He has follow up scheduled with Dr. Johnsie Cancel on 12/28/17. I expect that his EF will have improved. Currently he is asymptomatic and back to work without any difficulty.   Dilated cardiomyopathy: If he continues to have decreased EF after medication optimization and maintenance of sinus rhythm he would likekly need cardiac cath to further evaluate.   Atrial flutter:  normal rate and rhythm. EKG shows sinus rhythm. Maintained on beta blocker.  Anticoagulated with Xarelto 20 mg daily for CHA2DS2-VASc score of 2 (CHF, HTN). No unusual bleeding. Pt has had no palpitations or recurrent feelings of shortness of breath  that he had with aflutter. We reviewed what to look for what to report.   Hypertension: BP currently well controlled. Now on metoprolol and Entresto.   Hyperlipidemia:  on crestor and LDL 85 on 10/26/17.   Medication Adjustments/Labs and Tests Ordered: Current medicines are reviewed at length with the patient today.  Concerns regarding medicines are outlined above. Labs and tests ordered and medication changes are outlined in the patient instructions below:  Patient Instructions  Medication Instructions: STOP: Metoprolol tartrate   START: Metoprolol Succinate (Torolol XL) 100 mg daily   Labwork: None Ordered  Procedures/Testing: None Ordered  Follow-Up: Keep your follow up appointment with Dr.Nishan on 10/28 @ 10:45 AM  Keep appointment with pharmacy on 9/19 @ 3:00 PM   Any Additional Special Instructions Will Be Listed Below (If Applicable).   DASH Eating Plan DASH stands for "Dietary Approaches to Stop Hypertension." The DASH eating plan is a healthy eating plan that has been shown to reduce high blood pressure (hypertension). It may also reduce your risk for type 2 diabetes, heart disease, and stroke. The DASH eating plan may also help with weight loss. What are tips for following this plan? General guidelines  Avoid eating more than 2,300 mg (milligrams) of salt (sodium) a day. If you have hypertension, you may need to reduce your sodium intake to 1,500 mg a day.  Limit alcohol intake to no more than 1 drink a day for nonpregnant women and 2 drinks a day for men. One drink equals 12 oz of beer, 5 oz of wine, or 1 oz of hard liquor.  Work with your health care provider to maintain a healthy body weight or to lose weight. Ask what an ideal weight is for you.  Get at least 30 minutes of exercise that causes your heart to beat faster (aerobic exercise) most days of the week. Activities may include walking, swimming, or biking.  Work with your health care provider or diet and  nutrition specialist (dietitian) to adjust your eating plan to your individual calorie needs. Reading food labels  Check food labels for the amount of sodium per serving. Choose foods with less than 5 percent of the Daily Value of sodium. Generally, foods with less than 300 mg of sodium per serving fit into this eating plan.  To find whole grains, look for the word "whole" as the first word in the ingredient list. Shopping  Buy products labeled as "low-sodium" or "no salt added."  Buy fresh foods. Avoid canned foods and premade or frozen meals. Cooking  Avoid adding salt when cooking. Use salt-free seasonings or herbs instead of table salt or sea salt. Check with your health care provider or pharmacist before using salt substitutes.  Do not fry foods. Cook foods using healthy methods such as baking, boiling, grilling, and broiling instead.  Cook with heart-healthy oils, such as olive, canola, soybean, or sunflower oil. Meal planning   Eat a balanced diet that includes: ? 5 or more servings of fruits and vegetables each day. At each meal, try to fill half of your plate with fruits and vegetables. ? Up to 6-8 servings of whole grains each day. ? Less than 6 oz of lean meat, poultry, or fish each day. A 3-oz serving of meat is about the same size as a deck of cards. One egg equals 1 oz. ? 2 servings of low-fat dairy each day. ? A serving of nuts, seeds, or beans 5 times each week. ? Heart-healthy fats. Healthy fats called Omega-3 fatty acids are found in foods such as flaxseeds and coldwater fish, like sardines, salmon, and mackerel.  Limit how much you eat of the following: ? Canned or prepackaged foods. ? Food that is high in trans fat, such as fried foods. ? Food that is high in saturated fat, such as fatty meat. ? Sweets, desserts, sugary drinks, and other foods with added sugar. ? Full-fat dairy products.  Do not salt  foods before eating.  Try to eat at least 2 vegetarian  meals each week.  Eat more home-cooked food and less restaurant, buffet, and fast food.  When eating at a restaurant, ask that your food be prepared with less salt or no salt, if possible. What foods are recommended? The items listed may not be a complete list. Talk with your dietitian about what dietary choices are best for you. Grains Whole-grain or whole-wheat bread. Whole-grain or whole-wheat pasta. Kubicki rice. [place]ena Morrow. Bulgur. Whole-grain and low-sodium cereals. Pita bread. Low-fat, low-sodium crackers. Whole-wheat flour tortillas. Vegetables Fresh or frozen vegetables (raw, steamed, roasted, or grilled). Low-sodium or reduced-sodium tomato and vegetable juice. Low-sodium or reduced-sodium tomato sauce and tomato paste. Low-sodium or reduced-sodium canned vegetables. Fruits All fresh, dried, or frozen fruit. Canned fruit in natural juice (without added sugar). Meat and other protein foods Skinless chicken or [place]. Ground chicken or [place]. Pork with fat trimmed off. Fish and seafood. Egg whites. Dried beans, peas, or lentils. Unsalted nuts, nut butters, and seeds. Unsalted canned beans. Lean cuts of beef with fat trimmed off. Low-sodium, lean deli meat. Dairy Low-fat (1%) or fat-free (skim) milk. Fat-free, low-fat, or reduced-fat cheeses. Nonfat, low-sodium ricotta or cottage cheese. Low-fat or nonfat yogurt. Low-fat, low-sodium cheese. Fats and oils Soft margarine without trans fats. Vegetable oil. Low-fat, reduced-fat, or light mayonnaise and salad dressings (reduced-sodium). Canola, safflower, olive, soybean, and sunflower oils. Avocado. Seasoning and other foods Herbs. Spices. Seasoning mixes without salt. Unsalted popcorn and pretzels. Fat-free sweets. What foods are not recommended? The items listed may not be a complete list. Talk with your dietitian about what dietary choices are best for you. Grains Baked goods made with fat, such as croissants, muffins, or some  breads. Dry pasta or rice meal packs. Vegetables Creamed or fried vegetables. Vegetables in a cheese sauce. Regular canned vegetables (not low-sodium or reduced-sodium). Regular canned tomato sauce and paste (not low-sodium or reduced-sodium). Regular tomato and vegetable juice (not low-sodium or reduced-sodium). Angie Fava. Olives. Fruits Canned fruit in a light or heavy syrup. Fried fruit. Fruit in cream or butter sauce. Meat and other protein foods Fatty cuts of meat. Ribs. Fried meat. Berniece Salines. Sausage. Bologna and other processed lunch meats. Salami. Fatback. Hotdogs. Bratwurst. Salted nuts and seeds. Canned beans with added salt. Canned or smoked fish. Whole eggs or egg yolks. Chicken or [place] with skin. Dairy Whole or 2% milk, cream, and half-and-half. Whole or full-fat cream cheese. Whole-fat or sweetened yogurt. Full-fat cheese. Nondairy creamers. Whipped toppings. Processed cheese and cheese spreads. Fats and oils Butter. Stick margarine. Lard. Shortening. Ghee. Bacon fat. Tropical oils, such as coconut, palm kernel, or palm oil. Seasoning and other foods Salted popcorn and pretzels. Onion salt, garlic salt, seasoned salt, table salt, and sea salt. Worcestershire sauce. Tartar sauce. Barbecue sauce. Teriyaki sauce. Soy sauce, including reduced-sodium. Steak sauce. Canned and packaged gravies. Fish sauce. Oyster sauce. Cocktail sauce. Horseradish that you find on the shelf. Ketchup. Mustard. Meat flavorings and tenderizers. Bouillon cubes. Hot sauce and Tabasco sauce. Premade or packaged marinades. Premade or packaged taco seasonings. Relishes. Regular salad dressings. Where to find more information:  National Heart, Lung, and [place]: https://wilson-eaton.com/  American Heart Association: www.heart.org Summary  The DASH eating plan is a healthy eating plan that has been shown to reduce high blood pressure (hypertension). It may also reduce your risk for type 2 diabetes, heart disease, and  stroke.  With the DASH eating plan, you should limit salt (sodium) intake to  2,300 mg a day. If you have hypertension, you may need to reduce your sodium intake to 1,500 mg a day.  When on the DASH eating plan, aim to eat more fresh fruits and vegetables, whole grains, lean proteins, low-fat dairy, and heart-healthy fats.  Work with your health care provider or diet and nutrition specialist (dietitian) to adjust your eating plan to your individual calorie needs. This information is not intended to replace advice given to you by your health care provider. Make sure you discuss any questions you have with your health care provider. Document Released: 03/04/2011 Document Revised: 03/08/2016 Document Reviewed: 03/08/2016 Elsevier Interactive Patient Education  [employer].    If you need a refill on your cardiac medications before your next appointment, please call your pharmacy.      Signed, Daune Perch, NP  11/29/2017 4:28 PM     [facility]

## 2017-12-15 ENCOUNTER — Other Ambulatory Visit: Payer: BLUE CROSS/BLUE SHIELD | Admitting: *Deleted

## 2017-12-15 ENCOUNTER — Ambulatory Visit (INDEPENDENT_AMBULATORY_CARE_PROVIDER_SITE_OTHER): Payer: BLUE CROSS/BLUE SHIELD | Admitting: Pharmacist

## 2017-12-15 VITALS — BP 134/80 | HR 56

## 2017-12-15 DIAGNOSIS — I5021 Acute systolic (congestive) heart failure: Secondary | ICD-10-CM | POA: Diagnosis not present

## 2017-12-15 DIAGNOSIS — Z79899 Other long term (current) drug therapy: Secondary | ICD-10-CM

## 2017-12-15 NOTE — Progress Notes (Signed)
Patient ID: Carlos Harrison                 DOB: [birth year]                      MRN: [id number]     HPI: Carlos Harrison is a pleasant 57 y.o. male patient of Dr Johnsie Cancel referred by Pecolia Ades, NP to HTN clinic. PMH is significant for HTN, HLD, atrial flutter, CKD stage II, HFrEF with LVEF 15-20% on 11/16/17 TEE guided DCCV due to presentation with atrial flutter. Pt presents today for HF medication optimization which may be limited by hypotension.  Pt presents today in good spirits with his wife. He reports tolerating his Entresto well. He has been checking his BP at home which has improved to ~130/80. HR has been running in the mid 50s at home. He had 1 episode of dizziness when changing positions however overall has been feeling well. He will qualify for $10 monthly copay card for [facility]. He has been weighing himself at home - ranging 216 - 220 lbs. He has been taking his Xarelto a few hours before dinner - advised pt to take this with dinner to optimize absorption.  Current HTN meds: Entresto 24-26mg  BID, Toprol 100mg  daily (8AM) Previously tried: amlodipine and Micardis - discontinued in hospital due to hypotension BP goal: 130/21mmHg  Family History: Non contributory  Social History: Denies tobacco, alcohol, and illicit drug use.  Diet: Salads, grilled chicken, grits, eggs.   Exercise: Walks - lives on the 3rd floor and elevators not working currently.  Home BP readings: Checks BP in AM - and after medications - 130/78, HR 52. Usually 54-56 HR at home   Wt Readings from Last 3 Encounters:  11/29/17 222 lb 1.9 oz (100.8 kg)  11/17/17 235 lb 14.3 oz (107 kg)  11/15/17 240 lb (108.9 kg)   BP Readings from Last 3 Encounters:  11/29/17 102/70  11/17/17 117/87  11/15/17 106/90   Pulse Readings from Last 3 Encounters:  11/29/17 63  11/17/17 75  11/15/17 (!) 134    Renal function: CrCl cannot be calculated (Patient's most recent lab result is older than the maximum 21 days  allowed.).  Past Medical History:  Diagnosis Date  . Atrial fibrillation ([place]) 10/2017  . Cardiomyopathy ([place]) 11/10/2017  . Dyspnea   . Hypertension   . Palpitations 10/26/2017    Current Outpatient Medications on File Prior to Visit  Medication Sig Dispense Refill  . metoprolol succinate (TOPROL-XL) 100 MG 24 hr tablet Take 1 tablet (100 mg total) by mouth daily. Take with or immediately following a meal. 90 tablet 3  . rivaroxaban (XARELTO) 20 MG TABS tablet Take 1 tablet (20 mg total) by mouth daily with supper. 30 tablet 5  . rosuvastatin (CRESTOR) 10 MG tablet TAKE 1 TABLET BY MOUTH EVERY DAY 90 tablet 1  . sacubitril-valsartan (ENTRESTO) 24-26 MG Take 1 tablet by mouth 2 (two) times daily. 60 tablet 0   No current facility-administered medications on file prior to visit.     No Known Allergies   Assessment/Plan:  1. HF medication optimization - BP controlled today and we have room to optimize HF medications. If BMET today is stable, will plan to titrate Entresto to 49-51mg  BID dose. Continue Toprol 100mg  daily - advised pt to call clinic if his HR drops < 55 on a regular basis and we may need to decrease dose. Will call pt tomorrow with lab results.  F/u in clinic in 2 weeks. Will recheck BMET at that visit and plan to start spironolactone if BP allows.   Megan E. Supple, [name], BCACP, [place] [address], [place], [place] [address] Phone: [phone]; Fax: [phone] 12/15/2017 3:30 PM  Addendum: BMET stable, ok to increase Entresto to 49-51mg  BID. Rx sent to pharmacy. Pt is aware of plan.

## 2017-12-15 NOTE — Patient Instructions (Addendum)
It was nice to see you today  Your blood pressure goal is < 130/35mmHg  If your labs are stable today, we will plan to increase your Entresto to 49-51mg  twice a day. I will call you tomorrow when we have your lab results available to discuss this  Continue taking your other medications. Take your Xarelto with dinner  Limit your salt intake to less than 2 grams (2,000mg ) each day  Limit your fluid intake to less than 2 liters each day  Follow up in clinic in 2-3 weeks for a blood pressure check and lab work. We would like to start a medicine called spironolactone at that visit if your blood pressure allows  Call Megan, pharmacist in clinic with any concerns about your blood pressure or heart rate [phone]

## 2017-12-16 ENCOUNTER — Telehealth: Payer: Self-pay | Admitting: Pharmacist

## 2017-12-16 LAB — BASIC METABOLIC PANEL
BUN/Creatinine Ratio: 14 (ref 9–20)
BUN: 15 mg/dL (ref 6–24)
CO2: 24 mmol/L (ref 20–29)
Calcium: 9.2 mg/dL (ref 8.7–10.2)
Chloride: 105 mmol/L (ref 96–106)
Creatinine, Ser: 1.05 mg/dL (ref 0.76–1.27)
GFR calc Af Amer: 92 mL/min/{1.73_m2} (ref >59)
GFR calc non Af Amer: 80 mL/min/{1.73_m2} (ref >59)
Glucose: 85 mg/dL (ref 65–99)
Potassium: 4.3 mmol/L (ref 3.5–5.2)
Sodium: 141 mmol/L (ref 134–144)

## 2017-12-16 MED ORDER — SACUBITRIL-VALSARTAN 49-51 MG PO TABS: 1.0000 | ORAL_TABLET | Freq: Two times a day (BID) | ORAL | 5 refills | Status: DC

## 2017-12-16 NOTE — Telephone Encounter (Signed)
BMET stable, ok to increase Entresto to 49-51mg  BID dosing. Pt is aware and will keep f/u in 2 weeks in pharmacy clinic.

## 2017-12-29 ENCOUNTER — Telehealth: Payer: Self-pay | Admitting: Pharmacist

## 2017-12-29 NOTE — Telephone Encounter (Signed)
Pt called clinic to report HR at home is ranging 42-48 at least once a day, otherwise in the mid 41s. He checks his HR 3x a day. Will decrease Toprol to 50mg  once a day. Instructed pt to continue to monitor HR at home and to keep f/u appt next week in pharmacy clinic.

## 2018-01-04 ENCOUNTER — Ambulatory Visit (INDEPENDENT_AMBULATORY_CARE_PROVIDER_SITE_OTHER): Payer: BLUE CROSS/BLUE SHIELD | Admitting: Pharmacist

## 2018-01-04 VITALS — BP 118/74 | HR 60

## 2018-01-04 DIAGNOSIS — I10 Essential (primary) hypertension: Secondary | ICD-10-CM | POA: Diagnosis not present

## 2018-01-04 DIAGNOSIS — I5021 Acute systolic (congestive) heart failure: Secondary | ICD-10-CM

## 2018-01-04 MED ORDER — METOPROLOL SUCCINATE ER 50 MG PO TB24: 50.0000 mg | ORAL_TABLET | Freq: Every day | ORAL | 3 refills | Status: DC

## 2018-01-04 NOTE — Progress Notes (Addendum)
Patient ID: Carlos Harrison                 DOB: November 05, 1960                      MRN: [id number]     HPI: Carlos Harrison is a pleasant 57 y.o. male patient of Dr Johnsie Cancel referred by Pecolia Ades, NP to HTN clinic. PMH is significant for HTN, HLD, atrial flutter, CKD stage II, HFrEF with LVEF 15-20% on 11/16/17 TEE guided DCCV due to presentation with atrial flutter. At last visit, Entresto was increased to 49-51mg  BID. Pt called clinic a few weeks later with reports of low HR in the 40-50s on a regular basis. Toprol dose was decreased to 50mg  daily and pt presents today for follow up.  Pt presents today in good spirits with his wife. He reports tolerating higher dose of his Entresto well. He has been checking his BP at home which has mostly been in the 120s/70s, did have a higher reading of 134/80-90. HR has improved to 54-57 at home since decreasing Toprol dose. No further HR in the 40s, HR normal today at 60. He has had a few brief episodes of dizziness when he stands up quickly, however these resolve after a second or so. He will qualify for $10 monthly copay card for [facility]. He moved his Xarelto dosing to dinnertime as advised at his last visit.  Current HTN meds: Entresto 49-51mg  BID, Toprol 50mg  daily (8AM) Previously tried: amlodipine and Micardis - discontinued in hospital due to hypotension BP goal: 130/32mmHg  Family History: Non contributory  Social History: Denies tobacco, alcohol, and illicit drug use.  Diet: Salads, grilled chicken, grits, eggs.   Exercise: Walks - lives on the 3rd floor and elevators not working currently.  Home BP readings: 120/70s mostly  Wt Readings from Last 3 Encounters:  11/29/17 222 lb 1.9 oz (100.8 kg)  11/17/17 235 lb 14.3 oz (107 kg)  11/15/17 240 lb (108.9 kg)   BP Readings from Last 3 Encounters:  12/15/17 134/80  11/29/17 102/70  11/17/17 117/87   Pulse Readings from Last 3 Encounters:  12/15/17 (!) 56  11/29/17 63  11/17/17 75     Renal function: CrCl cannot be calculated (Unknown ideal weight.).  Past Medical History:  Diagnosis Date  . Atrial fibrillation ([place]) 10/2017  . Cardiomyopathy ([place]) 11/10/2017  . Dyspnea   . Hypertension   . Palpitations 10/26/2017    Current Outpatient Medications on File Prior to Visit  Medication Sig Dispense Refill  . metoprolol succinate (TOPROL-XL) 100 MG 24 hr tablet Take 1/2 tablet by mouth daily 90 tablet 3  . rivaroxaban (XARELTO) 20 MG TABS tablet Take 1 tablet (20 mg total) by mouth daily with supper. 30 tablet 5  . rosuvastatin (CRESTOR) 10 MG tablet TAKE 1 TABLET BY MOUTH EVERY DAY 90 tablet 1  . sacubitril-valsartan (ENTRESTO) 49-51 MG Take 1 tablet by mouth 2 (two) times daily. 60 tablet 5   No current facility-administered medications on file prior to visit.     No Known Allergies   Assessment/Plan:  1. HF medication optimization - BP at goal <130/32mmHg today and there is room to optimize HF medications. If BMET today is stable, will plan to start spironolactone 12.5mg  daily. Continue Entresto 49-51mg  BID and lower dose of Toprol 50mg  daily since HR has improved to 50-60. Will call pt tomorrow with lab results and start spironolactone 12.5mg  at that time if  able. Recheck BMET on 10/18 already scheduled, pt to follow up in clinic with Dr Johnsie Cancel as scheduled on 10/28. Further f/u in pharmacy clinic as needed.   Megan E. Supple, [name], BCACP, [facility] [address] [place], [place] [address] Phone: [phone]; Fax: [phone] 01/04/2018 7:16 AM  ADDENDUM: BMET returned WNL. Will send RX for spironolactone 12.5mg  daily as discussed above. BMET recheck ordered and scheduled already. LMOM to make patient aware.

## 2018-01-04 NOTE — Patient Instructions (Addendum)
It was nice to see you today  Continue taking metoprolol 50mg  once a day and Entresto 49-51mg  twice a day  If your labs are stable, we will start spironolactone 12.5mg  - take 1/2 of the 25mg  tablet once a day  Monitor your blood pressure at home - your goal is < 130/46mmHg  Recheck lab work on Friday, October 18th  Keep follow up app with Dr Johnsie Cancel on October 28th

## 2018-01-05 LAB — BASIC METABOLIC PANEL
BUN/Creatinine Ratio: 15 (ref 9–20)
BUN: 14 mg/dL (ref 6–24)
CO2: 24 mmol/L (ref 20–29)
Calcium: 9.1 mg/dL (ref 8.7–10.2)
Chloride: 103 mmol/L (ref 96–106)
Creatinine, Ser: 0.91 mg/dL (ref 0.76–1.27)
GFR calc Af Amer: 109 mL/min/{1.73_m2} (ref >59)
GFR calc non Af Amer: 95 mL/min/{1.73_m2} (ref >59)
Glucose: 82 mg/dL (ref 65–99)
Potassium: 4.3 mmol/L (ref 3.5–5.2)
Sodium: 141 mmol/L (ref 134–144)

## 2018-01-05 MED ORDER — SPIRONOLACTONE 25 MG PO TABS: 12.5000 mg | ORAL_TABLET | Freq: Every day | ORAL | 3 refills | Status: DC

## 2018-01-05 NOTE — Addendum Note (Signed)
Addended by: Erskine Emery on: 01/05/2018 03:17 PM   Modules accepted: Orders

## 2018-01-13 ENCOUNTER — Other Ambulatory Visit: Payer: BLUE CROSS/BLUE SHIELD

## 2018-01-16 ENCOUNTER — Other Ambulatory Visit: Payer: BLUE CROSS/BLUE SHIELD

## 2018-01-17 ENCOUNTER — Telehealth: Payer: Self-pay | Admitting: Pharmacist

## 2018-01-17 ENCOUNTER — Other Ambulatory Visit: Payer: BLUE CROSS/BLUE SHIELD

## 2018-01-17 DIAGNOSIS — I10 Essential (primary) hypertension: Secondary | ICD-10-CM

## 2018-01-17 DIAGNOSIS — I5021 Acute systolic (congestive) heart failure: Secondary | ICD-10-CM

## 2018-01-17 NOTE — Telephone Encounter (Signed)
LMOM for pt - he missed BMET yesterday after starting spironolactone on 10/9.

## 2018-01-18 LAB — BASIC METABOLIC PANEL
BUN/Creatinine Ratio: 13 (ref 9–20)
BUN: 13 mg/dL (ref 6–24)
CO2: 24 mmol/L (ref 20–29)
Calcium: 8.9 mg/dL (ref 8.7–10.2)
Chloride: 107 mmol/L — ABNORMAL HIGH (ref 96–106)
Creatinine, Ser: 1.03 mg/dL (ref 0.76–1.27)
GFR calc Af Amer: 94 mL/min/{1.73_m2} (ref >59)
GFR calc non Af Amer: 81 mL/min/{1.73_m2} (ref >59)
Glucose: 101 mg/dL — ABNORMAL HIGH (ref 65–99)
Potassium: 4 mmol/L (ref 3.5–5.2)
Sodium: 145 mmol/L — ABNORMAL HIGH (ref 134–144)

## 2018-01-18 NOTE — Telephone Encounter (Signed)
Pt came in for BMET the next day - labs stable. Pt is aware to continue current therapy.

## 2018-01-19 NOTE — Progress Notes (Signed)
Cardiology Office Note:    Date:  01/23/2018   ID:  Carlos Harrison, DOB 1960-11-24, MRN [id number]  PCP:  [name], MD  Cardiologist:  Jenkins Rouge, MD  Referring MD: [name], MD   No chief complaint on file.   History of Present Illness:    57 y.o. HTN, HLD, PAF and chronic systolic CHF. 9/56/21 admitted with rapid afib/flutter. TEE/DCC 11/16/17 EF during arrhythmia 15-20% Improved since [facility]. Seen by Sherian Rein D and now on entresto 49/51 mg, aldactone 12.5 mg, and Toprol 50 mg Anticoagulation with normal dose xarelto.   No chest pain feels better rare dizziness on meds No palpitations or evidence of recurrent afib   Discussed need for r/o CAD and possible need AICD if EF remains low    Past Medical History:  Diagnosis Date  . Atrial fibrillation ([place]) 10/2017  . Cardiomyopathy ([place]) 11/10/2017  . Dyspnea   . Hypertension   . Palpitations 10/26/2017    Past Surgical History:  Procedure Laterality Date  . CARDIOVERSION N/A 11/16/2017   Procedure: CARDIOVERSION;  Surgeon: Acie Fredrickson Wonda Cheng, MD;  Location: [place];  Service: Cardiovascular;  Laterality: N/A;  . EYE SURGERY Left   . TEE WITH CARDIOVERSION  11/16/2017  . TEE WITHOUT CARDIOVERSION N/A 11/16/2017   Procedure: TRANSESOPHAGEAL ECHOCARDIOGRAM (TEE);  Surgeon: Thayer Headings, MD;  Location: [facility] ENDOSCOPY;  Service: Cardiovascular;  Laterality: N/A;    Current Medications: Current Meds  Medication Sig  . metoprolol succinate (TOPROL-XL) 50 MG 24 hr tablet Take 1 tablet (50 mg total) by mouth daily.  . rivaroxaban (XARELTO) 20 MG TABS tablet Take 1 tablet (20 mg total) by mouth daily with supper.  . rosuvastatin (CRESTOR) 10 MG tablet TAKE 1 TABLET BY MOUTH EVERY DAY  . sacubitril-valsartan (ENTRESTO) 49-51 MG Take 1 tablet by mouth 2 (two) times daily.  Marland Kitchen spironolactone (ALDACTONE) 25 MG tablet Take 0.5 tablets (12.5 mg total) by mouth daily.     Allergies:   Patient has no known allergies.    Social History   Socioeconomic History  . Marital status: Married    Spouse name: Not on file  . Number of children: 55  . Years of education: 9  . Highest education level: Not on file  Occupational History  . Occupation: [employer]  Social Needs  . Financial resource strain: Not on file  . Food insecurity:    Worry: Not on file    Inability: Not on file  . Transportation needs:    Medical: Not on file    Non-medical: Not on file  Tobacco Use  . Smoking status: Never Smoker  . Smokeless tobacco: Never Used  Substance and Sexual Activity  . Alcohol use: No  . Drug use: No  . Sexual activity: Not on file  Lifestyle  . Physical activity:    Days per week: Not on file    Minutes per session: Not on file  . Stress: Not on file  Relationships  . Social connections:    Talks on phone: Not on file    Gets together: Not on file    Attends religious service: Not on file    Active member of club or organization: Not on file    Attends meetings of clubs or organizations: Not on file    Relationship status: Not on file  Other Topics Concern  . Not on file  Social History Narrative   Fun: Mentoring youth, solitary games.    Denies  any religious beliefs effecting health care.      Family History: The patient's family history includes Ovarian cancer in his mother. ROS:   Please see the history of present illness.     All other systems reviewed and are negative.  EKGs/Labs/Other Studies Reviewed:    The following studies were reviewed today:  TEE 11/16/17 Study Conclusions - Left ventricle: The cavity size was mildly dilated. The estimated   ejection fraction was in the range of 15% to 20%. There was   severe spontaneous echo contrast, indicative of stasis. No   evidence of thrombus. - Aortic valve: There was trivial regurgitation. - Mitral valve: There was mild to moderate regurgitation. - Left atrium: No evidence of thrombus in the atrial cavity or    appendage.  Impressions: - Successful cardioversion. No cardiac source of emboli was   indentified.  11/10/17 TTE Study Conclusions - Procedure narrative: Transthoracic echocardiography. Image quality was adequate. Intravenous contrast (Definity) was administered to opacify the LV. - Left ventricle: The cavity size was moderately dilated. Wall thickness was increased in a pattern of mild LVH. There was moderate concentric hypertrophy. Systolic function was severely reduced. The estimated ejection fraction was in the range of 10% to 15%. Diffuse hypokinesis. The study is not technically sufficient to allow evaluation of LV diastolic function. Acoustic contrast opacification revealed no evidence ofthrombus. - Mitral valve: There was moderate regurgitation. - Left atrium: The atrium was moderately dilated. - Right ventricle: The cavity size was moderately dilated. Wall thickness was normal. - Tricuspid valve: There was moderate regurgitation. - Pulmonary arteries: Systolic pressure was mildly increased. PA peak pressure: 37 mm Hg (S).   EKG:  EKG is not ordered today.    Recent Labs: 10/26/2017: TSH 3.57 11/15/2017: B Natriuretic Peptide 640.2 11/16/2017: ALT 107; Hemoglobin 13.0; Platelets 226 01/17/2018: BUN 13; Creatinine, Ser 1.03; Potassium 4.0; Sodium 145   Recent Lipid Panel    Component Value Date/Time   CHOL 148 10/26/2017 1541   TRIG 88.0 10/26/2017 1541   HDL 44.80 10/26/2017 1541   CHOLHDL 3 10/26/2017 1541   VLDL 17.6 10/26/2017 1541   LDLCALC 85 10/26/2017 1541    Physical Exam:    VS:  BP (!) 142/78   Pulse (!) 57   Ht 6' 3.5" (1.918 m)   Wt 216 lb 8 oz (98.2 kg)   SpO2 98%   BMI 26.70 kg/m     Wt Readings from Last 3 Encounters:  01/23/18 216 lb 8 oz (98.2 kg)  11/29/17 222 lb 1.9 oz (100.8 kg)  11/17/17 235 lb 14.3 oz (107 kg)     Affect appropriate Healthy:  appears stated age HEENT: normal Neck supple with no  adenopathy JVP normal no bruits no thyromegaly Lungs clear with no wheezing and good diaphragmatic motion Heart:  S1/S2 no murmur, no rub, gallop or click PMI normal Abdomen: benighn, BS positve, no tenderness, no AAA no bruit.  No HSM or HJR Distal pulses intact with no bruits No edema Neuro non-focal Skin warm and dry No muscular weakness   ECG:  NSR rate 54 normal 01/23/18   ASSESSMENT:    No diagnosis found. PLAN:    In order of problems listed above:  Acute on chronic systolic heart failure:    Dilated cardiomyopathy on better goal directed Rx. F/U TTE November will be 3 months since [facility] and meds. If EF still severely Depressed will arrange right and left heart cath If recovered can have exercise myovue  Atrial flutter:  Maintaining NSR on xarelto and beta blocker CHADVASC 2 for HTN and CHF   Hypertension: Well controlled.  Continue current medications and low sodium Dash type diet.    Hyperlipidemia:  on crestor and LDL 85 on 10/26/17.   F/U after TTE in November to see if right and left heart cath needed    [facility]

## 2018-01-23 ENCOUNTER — Encounter: Payer: Self-pay | Admitting: Cardiovascular Disease

## 2018-01-23 ENCOUNTER — Ambulatory Visit (INDEPENDENT_AMBULATORY_CARE_PROVIDER_SITE_OTHER): Payer: BLUE CROSS/BLUE SHIELD | Admitting: Cardiovascular Disease

## 2018-01-23 VITALS — BP 142/78 | HR 57 | Ht 75.5 in | Wt 216.5 lb

## 2018-01-23 DIAGNOSIS — I420 Dilated cardiomyopathy: Secondary | ICD-10-CM

## 2018-01-23 NOTE — Patient Instructions (Signed)
Medication Instructions:  Your physician recommends that you continue on your current medications as directed. Please refer to the Current Medication list given to you today.  Labwork: NONE  Testing/Procedures: Your physician has requested that you have an echocardiogram at the end of November. Echocardiography is a painless test that uses sound waves to create images of your heart. It provides your doctor with information about the size and shape of your heart and how well your heart's chambers and valves are working. This procedure takes approximately one hour. There are no restrictions for this procedure.  Follow-Up: Your physician wants you to follow-up in: 6 months with Dr. Johnsie Cancel. You will receive a reminder letter in the mail two months in advance. If you don't receive a letter, please call our office to schedule the follow-up appointment.   If you need a refill on your cardiac medications before your next appointment, please call your pharmacy.

## 2018-01-27 ENCOUNTER — Telehealth: Payer: Self-pay | Admitting: Cardiovascular Disease

## 2018-01-27 NOTE — Telephone Encounter (Signed)
Called patient back about his message. Patient complaining of chest soreness in the middle of his chest when he lefts his arms up since Wednesday. Patient stated he was doing a lot of sliding and pushing (not lifting) boxes at his work that were over 35 lbs. Patient stated his BP and HR have been good. Informed patient is might just be chest muscle soreness from all the work he did on Wednesday. Informed patient that a message would be sent to Dr. Johnsie Cancel for advisement. Patient verbalized understanding.

## 2018-01-27 NOTE — Telephone Encounter (Signed)
Sounds like muscular pain

## 2018-01-27 NOTE — Telephone Encounter (Signed)
New message:       Pt c/o of Chest Pain: STAT if CP now or developed within 24 hours  1. Are you having CP right now? Chest Pressure  2. Are you experiencing any other symptoms (ex. SOB, nausea, vomiting, sweating)? No  3. How long have you been experiencing CP? Since Wednedsday  4. Is your CP continuous or coming and going? Coming and going  5. Have you taken Nitroglycerin?      Pt is calling and states he is having some pain in the middle of his chest. Not sure if it's what he does at work but when he moves his arms up and down it hurts. He wants to make sure it's from the lifting he is doing and at work and nothing else. ?

## 2018-01-27 NOTE — Telephone Encounter (Signed)
Patient aware of Dr. Kyla Balzarine advisement. Patient verbalized understanding.

## 2018-02-06 NOTE — Addendum Note (Signed)
Addended by: Joaquim Lai on: 02/06/2018 10:54 AM   Modules accepted: Orders

## 2018-02-15 ENCOUNTER — Other Ambulatory Visit: Payer: Self-pay

## 2018-02-15 ENCOUNTER — Ambulatory Visit (HOSPITAL_COMMUNITY): Payer: BLUE CROSS/BLUE SHIELD | Attending: Cardiology

## 2018-02-15 DIAGNOSIS — I420 Dilated cardiomyopathy: Secondary | ICD-10-CM | POA: Insufficient documentation

## 2018-04-03 ENCOUNTER — Telehealth: Payer: Self-pay | Admitting: Cardiovascular Disease

## 2018-04-03 NOTE — Telephone Encounter (Signed)
New Message    Pt c/o medication issue:  1. Name of Medication: Xareto  2. How are you currently taking this medication (dosage and times per day)? 20mg  once a day  3. Are you having a reaction (difficulty breathing--STAT)? No  4. What is your medication issue? Patient missed medication a few days during holidays (26th and 27th) but has been taking it since then but is having neck pain and wants to know if that would be a side effect from missing medication.

## 2018-04-03 NOTE — Telephone Encounter (Signed)
Please see previous note.

## 2018-04-03 NOTE — Telephone Encounter (Signed)
Xarelto should not cause any neck pain.

## 2018-04-03 NOTE — Telephone Encounter (Signed)
Left message for patient to call back  

## 2018-04-03 NOTE — Telephone Encounter (Signed)
Called patient back about his message. Informed patient that his xarelto should not have cause him neck pain. Patient verbalized understanding.

## 2018-04-03 NOTE — Telephone Encounter (Signed)
New Message    Patient missed your call and would like you to call him back when you have time again

## 2018-04-11 ENCOUNTER — Other Ambulatory Visit: Payer: Self-pay | Admitting: Internal Medicine

## 2018-04-16 ENCOUNTER — Other Ambulatory Visit: Payer: Self-pay | Admitting: Internal Medicine

## 2018-04-25 NOTE — Patient Instructions (Addendum)
  Tests ordered today. Your results will be released to MyChart (or called to you) after review, usually within 72hours after test completion. If any changes need to be made, you will be notified at that same time.  Medications reviewed and updated.  Changes include :   none      Please followup in 6 months   

## 2018-04-25 NOTE — Progress Notes (Signed)
Subjective:    Patient ID: Carlos Harrison, male    DOB: July 23, 1960, 58 y.o.   MRN: [id number]  HPI The patient is here for follow up.  HFrEF, Aflutter, Hypertension: He is taking his medication daily. He is compliant with a low sodium diet.  He had an episode of chest pain recently that was after lifting something heavy and sounds muscular skeletal in nature.  He denies other chest pain.  He denies palpitations, edema, shortness of breath and regular headaches. He is active at work, but not exercising regularly.  He does not monitor his blood pressure at home.    Hyperlipidemia: He is taking his medication daily. He is compliant with a low fat/cholesterol diet. He is active, but not exercising regularly. He denies myalgias.   Prediabetes:  He is compliant with a low sugar/carbohydrate diet.  He is active, but not exercising regularly.  Right sided neck pain;  It started about 3 weeks ago.  Pain is a 1/10.  He works radiates up head at times. No N/ T or headaches.  The pain has gotten better.  He thinks the pain is from having to dock down under a cabinet at work.  He is to do this several times a day.  Mouth is dry when he wakes up  - dry cotton feeling in am.  Sleeps with mouth open.  He does not snore.  Once he drinks water in the morning the dry mouth goes away.   Medications and allergies reviewed with patient and updated if appropriate.  Patient Active Problem List   Diagnosis Date Noted  . Neck pain 04/26/2018  . Dilated cardiomyopathy ([place]) 04/26/2018  . Atrial flutter with rapid ventricular response ([place]) 10/26/2017  . Prediabetes 04/27/2017  . Hypertension 02/21/2017  . Hyperlipidemia 02/21/2017  . Sacroiliac joint dysfunction of right side 07/30/2014    Current Outpatient Medications on File Prior to Visit  Medication Sig Dispense Refill  . metoprolol succinate (TOPROL-XL) 50 MG 24 hr tablet Take 1 tablet (50 mg total) by mouth daily. 90 tablet 3  . rosuvastatin  (CRESTOR) 10 MG tablet TAKE 1 TABLET BY MOUTH EVERY DAY 90 tablet 1  . sacubitril-valsartan (ENTRESTO) 49-51 MG Take 1 tablet by mouth 2 (two) times daily. 60 tablet 5  . XARELTO 20 MG TABS tablet TAKE 1 TABLET (20 MG TOTAL) BY MOUTH DAILY WITH SUPPER. 30 tablet 5  . spironolactone (ALDACTONE) 25 MG tablet Take 0.5 tablets (12.5 mg total) by mouth daily. 90 tablet 3   No current facility-administered medications on file prior to visit.     Past Medical History:  Diagnosis Date  . Atrial fibrillation ([place]) 10/2017  . Cardiomyopathy ([place]) 11/10/2017  . Dyspnea   . Hypertension   . Palpitations 10/26/2017    Past Surgical History:  Procedure Laterality Date  . CARDIOVERSION N/A 11/16/2017   Procedure: CARDIOVERSION;  Surgeon: Acie Fredrickson Wonda Cheng, MD;  Location: [place];  Service: Cardiovascular;  Laterality: N/A;  . EYE SURGERY Left   . TEE WITH CARDIOVERSION  11/16/2017  . TEE WITHOUT CARDIOVERSION N/A 11/16/2017   Procedure: TRANSESOPHAGEAL ECHOCARDIOGRAM (TEE);  Surgeon: Acie Fredrickson Wonda Cheng, MD;  Location: [facility] ENDOSCOPY;  Service: Cardiovascular;  Laterality: N/A;    Social History   Socioeconomic History  . Marital status: Married    Spouse name: Not on file  . Number of children: 19  . Years of education: 20  . Highest education level: Not on file  Occupational History  .  Occupation: [employer]  Social Needs  . Financial resource strain: Not on file  . Food insecurity:    Worry: Not on file    Inability: Not on file  . Transportation needs:    Medical: Not on file    Non-medical: Not on file  Tobacco Use  . Smoking status: Never Smoker  . Smokeless tobacco: Never Used  Substance and Sexual Activity  . Alcohol use: No  . Drug use: No  . Sexual activity: Not on file  Lifestyle  . Physical activity:    Days per week: Not on file    Minutes per session: Not on file  . Stress: Not on file  Relationships  . Social connections:    Talks on phone: Not on file     Gets together: Not on file    Attends religious service: Not on file    Active member of club or organization: Not on file    Attends meetings of clubs or organizations: Not on file    Relationship status: Not on file  Other Topics Concern  . Not on file  Social History Narrative   Fun: Mentoring youth, solitary games.    Denies any religious beliefs effecting health care.     Family History  Problem Relation Age of Onset  . Ovarian cancer Mother     Review of Systems  Constitutional: Negative for chills and fever.  Respiratory: Negative for cough, shortness of breath and wheezing.   Cardiovascular: Positive for chest pain (a ltitle today at work - lifting something heavy - no pain now). Negative for palpitations and leg swelling.  Musculoskeletal: Positive for neck pain.  Neurological: Positive for light-headedness (once two weeks ago - with getting up too quickly). Negative for numbness and headaches.       Objective:   Vitals:   04/26/18 1407  BP: 124/80  Pulse: 61  Resp: 16  Temp: 98.2 F (36.8 C)  SpO2: 97%   BP Readings from Last 3 Encounters:  04/26/18 124/80  01/23/18 (!) 142/78  01/04/18 118/74   Wt Readings from Last 3 Encounters:  04/26/18 218 lb (98.9 kg)  01/23/18 216 lb 8 oz (98.2 kg)  11/29/17 222 lb 1.9 oz (100.8 kg)   Body mass index is 26.89 kg/m.   Physical Exam    Constitutional: Appears well-developed and well-nourished. No distress.  HENT:  Head: Normocephalic and atraumatic.  Neck: Neck supple. No tracheal deviation present. No thyromegaly present.  No cervical lymphadenopathy Cardiovascular: Normal rate, regular rhythm and normal heart sounds.   No murmur heard. No carotid bruit .  No edema Pulmonary/Chest: Effort normal and breath sounds normal. No respiratory distress. No has no wheezes. No rales.  Msk; mild right lateral-posterior muscular pain in neck, no cervical spine tenderness Skin: Skin is warm and dry. Not diaphoretic.    Psychiatric: Normal mood and affect. Behavior is normal.      Assessment & Plan:    See Problem List for Assessment and Plan of chronic medical problems.

## 2018-04-26 ENCOUNTER — Other Ambulatory Visit (INDEPENDENT_AMBULATORY_CARE_PROVIDER_SITE_OTHER): Payer: BLUE CROSS/BLUE SHIELD

## 2018-04-26 ENCOUNTER — Ambulatory Visit (INDEPENDENT_AMBULATORY_CARE_PROVIDER_SITE_OTHER): Payer: BLUE CROSS/BLUE SHIELD | Admitting: Internal Medicine

## 2018-04-26 ENCOUNTER — Encounter: Payer: Self-pay | Admitting: Internal Medicine

## 2018-04-26 VITALS — BP 124/80 | HR 61 | Temp 98.2°F | Resp 16 | Ht 75.5 in | Wt 218.0 lb

## 2018-04-26 DIAGNOSIS — R7303 Prediabetes: Secondary | ICD-10-CM

## 2018-04-26 DIAGNOSIS — I10 Essential (primary) hypertension: Secondary | ICD-10-CM

## 2018-04-26 DIAGNOSIS — M542 Cervicalgia: Secondary | ICD-10-CM | POA: Diagnosis not present

## 2018-04-26 DIAGNOSIS — E7849 Other hyperlipidemia: Secondary | ICD-10-CM | POA: Diagnosis not present

## 2018-04-26 DIAGNOSIS — I420 Dilated cardiomyopathy: Secondary | ICD-10-CM

## 2018-04-26 DIAGNOSIS — I4892 Unspecified atrial flutter: Secondary | ICD-10-CM

## 2018-04-26 LAB — COMPREHENSIVE METABOLIC PANEL
ALT: 33 U/L (ref 0–53)
AST: 27 U/L (ref 0–37)
Albumin: 4.2 g/dL (ref 3.5–5.2)
Alkaline Phosphatase: 39 U/L (ref 39–117)
BUN: 10 mg/dL (ref 6–23)
CO2: 28 mEq/L (ref 19–32)
Calcium: 9.3 mg/dL (ref 8.4–10.5)
Chloride: 104 mEq/L (ref 96–112)
Creatinine, Ser: 0.94 mg/dL (ref 0.40–1.50)
GFR: 100.57 mL/min (ref >60.00)
Glucose, Bld: 104 mg/dL — ABNORMAL HIGH (ref 70–99)
Potassium: 4.2 mEq/L (ref 3.5–5.1)
Sodium: 139 mEq/L (ref 135–145)
Total Bilirubin: 0.7 mg/dL (ref 0.2–1.2)
Total Protein: 6.8 g/dL (ref 6.0–8.3)

## 2018-04-26 LAB — CBC WITH DIFFERENTIAL/PLATELET
Basophils Absolute: 0 10*3/uL (ref 0.0–0.1)
Basophils Relative: 0.4 % (ref 0.0–3.0)
Eosinophils Absolute: 0.2 10*3/uL (ref 0.0–0.7)
Eosinophils Relative: 4.6 % (ref 0.0–5.0)
HCT: 37.3 % — ABNORMAL LOW (ref 39.0–52.0)
Hemoglobin: 12.4 g/dL — ABNORMAL LOW (ref 13.0–17.0)
Lymphocytes Relative: 46.7 % — ABNORMAL HIGH (ref 12.0–46.0)
Lymphs Abs: 1.7 10*3/uL (ref 0.7–4.0)
MCHC: 33.3 g/dL (ref 30.0–36.0)
MCV: 86.8 fl (ref 78.0–100.0)
Monocytes Absolute: 0.2 10*3/uL (ref 0.1–1.0)
Monocytes Relative: 6.3 % (ref 3.0–12.0)
Neutro Abs: 1.5 10*3/uL (ref 1.4–7.7)
Neutrophils Relative %: 42 % — ABNORMAL LOW (ref 43.0–77.0)
Platelets: 159 10*3/uL (ref 150.0–400.0)
RBC: 4.3 Mil/uL (ref 4.22–5.81)
RDW: 14.4 % (ref 11.5–15.5)
WBC: 3.7 10*3/uL — ABNORMAL LOW (ref 4.0–10.5)

## 2018-04-26 LAB — LIPID PANEL
Cholesterol: 179 mg/dL (ref 0–200)
HDL: 48.2 mg/dL (ref >39.00)
LDL Cholesterol: 108 mg/dL — ABNORMAL HIGH (ref 0–99)
NonHDL: 130.85
Total CHOL/HDL Ratio: 4
Triglycerides: 115 mg/dL (ref 0.0–149.0)
VLDL: 23 mg/dL (ref 0.0–40.0)

## 2018-04-26 LAB — TSH: TSH: 2.15 u[IU]/mL (ref 0.35–4.50)

## 2018-04-26 LAB — HEMOGLOBIN A1C: Hgb A1c MFr Bld: 6.1 % (ref 4.6–6.5)

## 2018-04-26 NOTE — Assessment & Plan Note (Signed)
Check lipid panel  Continue daily statin Regular exercise and healthy diet encouraged  

## 2018-04-26 NOTE — Assessment & Plan Note (Signed)
BP well controlled Current regimen effective and well tolerated Continue current medications at current doses cmp  

## 2018-04-26 NOTE — Assessment & Plan Note (Signed)
Taking medications daily Euvolemic on exam and asymptomatic Has follow-up with cardiology No change in medications CBC, CMP, lipid panel, TSH

## 2018-04-26 NOTE — Assessment & Plan Note (Signed)
Right side of neck-likely muscular in nature He does have some muscle soreness on exam Likely related to having to have to bend over at work and look under a cabinet frequently Pain is mild and seems to be getting better Advised heat, gentle stretching, massage Tylenol as needed

## 2018-04-26 NOTE — Assessment & Plan Note (Signed)
Rate controlled, appears to be in sinus rhythm On metoprolol, Xarelto Has cardiology follow-up CMP, CBC

## 2018-04-26 NOTE — Assessment & Plan Note (Signed)
Check a1c Low sugar / carb diet Stressed regular exercise   

## 2018-04-27 ENCOUNTER — Telehealth: Payer: Self-pay | Admitting: Internal Medicine

## 2018-04-27 NOTE — Telephone Encounter (Signed)
Pt calling back to receive lab results

## 2018-04-27 NOTE — Telephone Encounter (Signed)
Noted  

## 2018-04-27 NOTE — Telephone Encounter (Signed)
Attempted to contact pt; left message on voicemail [phone].

## 2018-04-27 NOTE — Telephone Encounter (Signed)
Pt given results per Dr Quay Burow; see result note dated 04/17/2018.

## 2018-05-19 ENCOUNTER — Encounter: Payer: Self-pay | Admitting: Nurse Practitioner

## 2018-05-19 ENCOUNTER — Other Ambulatory Visit: Payer: Self-pay

## 2018-05-19 ENCOUNTER — Ambulatory Visit (INDEPENDENT_AMBULATORY_CARE_PROVIDER_SITE_OTHER): Payer: BLUE CROSS/BLUE SHIELD | Admitting: Nurse Practitioner

## 2018-05-19 VITALS — BP 100/70 | HR 71 | Temp 99.0°F | Ht 75.5 in | Wt 216.0 lb

## 2018-05-19 DIAGNOSIS — R6889 Other general symptoms and signs: Secondary | ICD-10-CM

## 2018-05-19 LAB — POC INFLUENZA A&B (BINAX/QUICKVUE)
Influenza A, POC: POSITIVE — AB
Influenza B, POC: NEGATIVE

## 2018-05-19 MED ORDER — OSELTAMIVIR PHOSPHATE 75 MG PO CAPS: 75.0000 mg | ORAL_CAPSULE | Freq: Two times a day (BID) | ORAL | 0 refills | Status: DC

## 2018-05-19 NOTE — Patient Instructions (Signed)
Your flu test is positive  Please take tamiflu as prescribed  Please follow up for fevers over 101, if your symptoms get worse, or if your symptoms are not better in 1 week.   Influenza, Adult Influenza is also called "the flu." It is an infection in the lungs, nose, and throat (respiratory tract). It is caused by a virus. The flu causes symptoms that are similar to symptoms of a cold. It also causes a high fever and body aches. The flu spreads easily from person to person (is contagious). Getting a flu shot (influenza vaccination) every year is the best way to prevent the flu. What are the causes? This condition is caused by the influenza virus. You can get the virus by:  Breathing in droplets that are in the air from the cough or sneeze of a person who has the virus.  Touching something that has the virus on it (is contaminated) and then touching your mouth, nose, or eyes. What increases the risk? Certain things may make you more likely to get the flu. These include:  Not washing your hands often.  Having close contact with many people during cold and flu season.  Touching your mouth, eyes, or nose without first washing your hands.  Not getting a flu shot every year. You may have a higher risk for the flu, along with serious problems such as a lung infection (pneumonia), if you:  Are older than 65.  Are pregnant.  Have a weakened disease-fighting system (immune system) because of a disease or taking certain medicines.  Have a long-term (chronic) illness, such as: ? Heart, kidney, or lung disease. ? Diabetes. ? Asthma.  Have a liver disorder.  Are very overweight (morbidly obese).  Have anemia. This is a condition that affects your red blood cells. What are the signs or symptoms? Symptoms usually begin suddenly and last 4-14 days. They may include:  Fever and chills.  Headaches, body aches, or muscle aches.  Sore throat.  Cough.  Runny or stuffy (congested)  nose.  Chest discomfort.  Not wanting to eat as much as normal (poor appetite).  Weakness or feeling tired (fatigue).  Dizziness.  Feeling sick to your stomach (nauseous) or throwing up (vomiting). How is this treated? If the flu is found early, you can be treated with medicine that can help reduce how bad the illness is and how long it lasts (antiviral medicine). This may be given by mouth (orally) or through an IV tube. Taking care of yourself at home can help your symptoms get better. Your doctor may suggest:  Taking over-the-counter medicines.  Drinking plenty of fluids. The flu often goes away on its own. If you have very bad symptoms or other problems, you may be treated in a hospital. Follow these instructions at home:     Activity  Rest as needed. Get plenty of sleep.  Stay home from work or school as told by your doctor. ? Do not leave home until you do not have a fever for 24 hours without taking medicine. ? Leave home only to visit your doctor. Eating and drinking  Take an ORS (oral rehydration solution). This is a drink that is sold at pharmacies and stores.  Drink enough fluid to keep your pee (urine) pale yellow.  Drink clear fluids in small amounts as you are able. Clear fluids include: ? Water. ? Ice chips. ? Fruit juice that has water added (diluted fruit juice). ? Low-calorie sports drinks.  Eat bland, easy-to-digest  foods in small amounts as you are able. These foods include: ? Bananas. ? Applesauce. ? Rice. ? Lean meats. ? Toast. ? Crackers.  Do not eat or drink: ? Fluids that have a lot of sugar or caffeine. ? Alcohol. ? Spicy or fatty foods. General instructions  Take over-the-counter and prescription medicines only as told by your doctor.  Use a cool mist humidifier to add moisture to the air in your home. This can make it easier for you to breathe.  Cover your mouth and nose when you cough or sneeze.  Wash your hands with soap and  water often, especially after you cough or sneeze. If you cannot use soap and water, use alcohol-based hand sanitizer.  Keep all follow-up visits as told by your doctor. This is important. How is this prevented?   Get a flu shot every year. You may get the flu shot in late summer, fall, or winter. Ask your doctor when you should get your flu shot.  Avoid contact with people who are sick during fall and winter (cold and flu season). Contact a doctor if:  You get new symptoms.  You have: ? Chest pain. ? Watery poop (diarrhea). ? A fever.  Your cough gets worse.  You start to have more mucus.  You feel sick to your stomach.  You throw up. Get help right away if you:  Have shortness of breath.  Have trouble breathing.  Have skin or nails that turn a bluish color.  Have very bad pain or stiffness in your neck.  Get a sudden headache.  Get sudden pain in your face or ear.  Cannot eat or drink without throwing up. Summary  Influenza ("the flu") is an infection in the lungs, nose, and throat. It is caused by a virus.  Take over-the-counter and prescription medicines only as told by your doctor.  Getting a flu shot every year is the best way to avoid getting the flu. This information is not intended to replace advice given to you by your health care provider. Make sure you discuss any questions you have with your health care provider. Document Released: 12/23/2007 Document Revised: 08/31/2017 Document Reviewed: 08/31/2017 Elsevier Interactive Patient Education  2019 [employer].

## 2018-05-19 NOTE — Progress Notes (Signed)
Carlos Harrison is a 58 y.o. male with the following history as recorded in EpicCare:  Patient Active Problem List   Diagnosis Date Noted  . Neck pain 04/26/2018  . Dilated cardiomyopathy ([place]) 04/26/2018  . Atrial flutter with rapid ventricular response ([place]) 10/26/2017  . Prediabetes 04/27/2017  . Hypertension 02/21/2017  . Hyperlipidemia 02/21/2017  . Sacroiliac joint dysfunction of right side 07/30/2014    Current Outpatient Medications  Medication Sig Dispense Refill  . metoprolol succinate (TOPROL-XL) 50 MG 24 hr tablet Take 1 tablet (50 mg total) by mouth daily. 90 tablet 3  . rosuvastatin (CRESTOR) 10 MG tablet TAKE 1 TABLET BY MOUTH EVERY DAY 90 tablet 1  . sacubitril-valsartan (ENTRESTO) 49-51 MG Take 1 tablet by mouth 2 (two) times daily. 60 tablet 5  . XARELTO 20 MG TABS tablet TAKE 1 TABLET (20 MG TOTAL) BY MOUTH DAILY WITH SUPPER. 30 tablet 5  . oseltamivir (TAMIFLU) 75 MG capsule Take 1 capsule (75 mg total) by mouth 2 (two) times daily. 10 capsule 0  . spironolactone (ALDACTONE) 25 MG tablet Take 0.5 tablets (12.5 mg total) by mouth daily. 90 tablet 3   No current facility-administered medications for this visit.     Allergies: Patient has no known allergies.  Past Medical History:  Diagnosis Date  . Atrial fibrillation ([place]) 10/2017  . Cardiomyopathy ([place]) 11/10/2017  . Dyspnea   . Hypertension   . Palpitations 10/26/2017    Past Surgical History:  Procedure Laterality Date  . CARDIOVERSION N/A 11/16/2017   Procedure: CARDIOVERSION;  Surgeon: Acie Fredrickson Wonda Cheng, MD;  Location: [facility];  Service: Cardiovascular;  Laterality: N/A;  . EYE SURGERY Left   . TEE WITH CARDIOVERSION  11/16/2017  . TEE WITHOUT CARDIOVERSION N/A 11/16/2017   Procedure: TRANSESOPHAGEAL ECHOCARDIOGRAM (TEE);  Surgeon: Acie Fredrickson Wonda Cheng, MD;  Location: [facility] ENDOSCOPY;  Service: Cardiovascular;  Laterality: N/A;    Family History  Problem Relation Age of Onset  . Ovarian cancer Mother      Social History   Tobacco Use  . Smoking status: Never Smoker  . Smokeless tobacco: Never Used  Substance Use Topics  . Alcohol use: No     Subjective:  Carlos Harrison is here today requesting evaluation of acute complaint of flu like symptoms, which first began about 1 day ago. Reports: fevers, chills, body aches, headaches, cough, decreased appetite, loose stools Denies: syncope, confusion, abd pain, nausea, vomiting, urinary symptoms  Smoker? no Tried at home: motrin with good improvement of fevers, aches  ROS- See HPI   Objective:  Vitals:   05/19/18 1558  BP: 100/70  Pulse: 71  Temp: 99 F (37.2 C)  TempSrc: Oral  SpO2: 96%  Weight: 216 lb (98 kg)  Height: 6' 3.5" (1.918 m)    General: Well developed, well nourished, in no acute distress  Skin : Warm and dry.  Head: Normocephalic and atraumatic  Eyes: Sclera and conjunctiva clear; pupils round and reactive to light; extraocular movements intact  Ears: External normal; canals clear; tympanic membranes normal  Oropharynx: Pink, supple. No suspicious lesions  Neck: Supple Lungs: Respirations unlabored; clear to auscultation bilaterally without wheeze, rales, rhonchi  CVS exam: normal rate and regular rhythm, S1 and S2 normal.  Extremities: No edema, cyanosis, clubbing  Vessels: Symmetric bilaterally  Neurologic: Alert and oriented; speech intact; face symmetrical; moves all extremities well; CNII-XII intact without focal deficit  Psychiatric: Normal mood and affect.  Assessment:  1. Flu-like symptoms     Plan:  POCT flu testing is positive for flu A today Tamiflu course sent-dosing, side effects discussed Advised to stay home from work this weekend and rest, no work note needed Home management, fever reduction measures, red flags and return precautions including when to seek immediate care discussed and printed on AVS He will follow up for new, worsening symptoms of if symptoms continue for > 1 week  No  follow-ups on file.  Orders Placed This Encounter  Procedures  . POC Influenza A&B (Binax test)    Requested Prescriptions   Signed Prescriptions Disp Refills  . oseltamivir (TAMIFLU) 75 MG capsule 10 capsule 0    Sig: Take 1 capsule (75 mg total) by mouth 2 (two) times daily.

## 2018-05-24 ENCOUNTER — Ambulatory Visit (INDEPENDENT_AMBULATORY_CARE_PROVIDER_SITE_OTHER): Payer: BLUE CROSS/BLUE SHIELD | Admitting: Nurse Practitioner

## 2018-05-24 ENCOUNTER — Encounter: Payer: Self-pay | Admitting: Nurse Practitioner

## 2018-05-24 VITALS — BP 98/60 | HR 64 | Temp 97.6°F | Ht 75.5 in | Wt 210.0 lb

## 2018-05-24 DIAGNOSIS — J111 Influenza due to unidentified influenza virus with other respiratory manifestations: Secondary | ICD-10-CM | POA: Diagnosis not present

## 2018-05-24 NOTE — Progress Notes (Signed)
Carlos Harrison is a 58 y.o. male with the following history as recorded in EpicCare:  Patient Active Problem List   Diagnosis Date Noted  . Neck pain 04/26/2018  . Dilated cardiomyopathy ([place]) 04/26/2018  . Atrial flutter with rapid ventricular response ([place]) 10/26/2017  . Prediabetes 04/27/2017  . Hypertension 02/21/2017  . Hyperlipidemia 02/21/2017  . Sacroiliac joint dysfunction of right side 07/30/2014    Current Outpatient Medications  Medication Sig Dispense Refill  . metoprolol succinate (TOPROL-XL) 50 MG 24 hr tablet Take 1 tablet (50 mg total) by mouth daily. 90 tablet 3  . rosuvastatin (CRESTOR) 10 MG tablet TAKE 1 TABLET BY MOUTH EVERY DAY 90 tablet 1  . sacubitril-valsartan (ENTRESTO) 49-51 MG Take 1 tablet by mouth 2 (two) times daily. 60 tablet 5  . XARELTO 20 MG TABS tablet TAKE 1 TABLET (20 MG TOTAL) BY MOUTH DAILY WITH SUPPER. 30 tablet 5  . spironolactone (ALDACTONE) 25 MG tablet Take 0.5 tablets (12.5 mg total) by mouth daily. 90 tablet 3   No current facility-administered medications for this visit.     Allergies: Patient has no known allergies.  Past Medical History:  Diagnosis Date  . Atrial fibrillation ([place]) 10/2017  . Cardiomyopathy ([place]) 11/10/2017  . Dyspnea   . Hypertension   . Palpitations 10/26/2017    Past Surgical History:  Procedure Laterality Date  . CARDIOVERSION N/A 11/16/2017   Procedure: CARDIOVERSION;  Surgeon: Acie Fredrickson Wonda Cheng, MD;  Location: [place];  Service: Cardiovascular;  Laterality: N/A;  . EYE SURGERY Left   . TEE WITH CARDIOVERSION  11/16/2017  . TEE WITHOUT CARDIOVERSION N/A 11/16/2017   Procedure: TRANSESOPHAGEAL ECHOCARDIOGRAM (TEE);  Surgeon: Acie Fredrickson Wonda Cheng, MD;  Location[facility] ENDOSCOPY;  Service: Cardiovascular;  Laterality: N/A;    Family History  Problem Relation Age of Onset  . Ovarian cancer Mother     Social History   Tobacco Use  . Smoking status: Never Smoker  . Smokeless tobacco: Never Used  Substance  Use Topics  . Alcohol use: No     Subjective:  Carlos Harrison is here today requesting a work note to return to work. I saw him on 05/19/18 and he was positive for flu He was given tamiflu course, has completed full course and feeling better overall, no further fevers, chills, upset stomach.  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  Head: Normocephalic and atraumatic  Eyes: Sclera and conjunctiva clear; pupils round and reactive to light; extraocular movements intact  Oropharynx: Pink, supple. No suspicious lesions  Neck: Supple Lungs: Effort unlabored, no respiratory distress CVS exam: normal rate and regular rhythm.  Extremities: No edema, cyanosis, clubbing  Vessels: Symmetric bilaterally  Neurologic: Alert and oriented; speech intact; face symmetrical; moves all extremities well; CNII-XII intact without focal deficit  Psychiatric: Normal mood and affect.  Assessment:  1. Flu     Plan:   Improving. Has completed tamiflu. Work noted provided to pt to return this Friday, which is 8 days after first symptoms, should be okay to return then. He will follow up for  new, worsening, persistent symptoms  No follow-ups on file.  No orders of the defined types were placed in this encounter.   Requested Prescriptions    No prescriptions requested or ordered in this encounter

## 2018-05-24 NOTE — Patient Instructions (Signed)

## 2018-06-12 ENCOUNTER — Other Ambulatory Visit: Payer: Self-pay | Admitting: Cardiovascular Disease

## 2018-07-06 ENCOUNTER — Telehealth: Payer: Self-pay

## 2018-07-06 NOTE — Telephone Encounter (Signed)
Virtual Visit Pre-Appointment Phone Call  Steps For Call:  1. Confirm consent - "In the setting of the current Covid19 crisis, you are scheduled for a (phone or video) visit with your provider on (date) at (time).  Just as we do with many in-office visits, in order for you to participate in this visit, we must obtain consent.  If you'd like, I can send this to your mychart (if signed up) or email for you to review.  Otherwise, I can obtain your verbal consent now.  All virtual visits are billed to your insurance company just like a normal visit would be.  By agreeing to a virtual visit, we'd like you to understand that the technology does not allow for your provider to perform an examination, and thus may limit your provider's ability to fully assess your condition.  Finally, though the technology is pretty good, we cannot assure that it will always work on either your or our end, and in the setting of a video visit, we may have to convert it to a phone-only visit.  In either situation, we cannot ensure that we have a secure connection.  Are you willing to proceed?"  2. Give patient instructions for WebEx download to smartphone as below if video visit  3. Advise patient to be prepared with any vital sign or heart rhythm information, their current medicines, and a piece of paper and pen handy for any instructions they may receive the day of their visit  4. Inform patient they will receive a phone call 15 minutes prior to their appointment time (may be from unknown caller ID) so they should be prepared to answer  5. Confirm that appointment type is correct in Epic appointment notes (video vs telephone)    TELEPHONE CALL NOTE  Carlos Harrison has been deemed a candidate for a follow-up tele-health visit to limit community exposure during the Covid-19 pandemic. I spoke with the patient via phone to ensure availability of phone/video source, confirm preferred email & phone number, and discuss  instructions and expectations.  I reminded TORIS LAVERDIERE to be prepared with any vital sign and/or heart rhythm information that could potentially be obtained via home monitoring, at the time of his visit. I reminded DEKLAN MINAR to expect a phone call at the time of his visit if his visit.  Did the patient verbally acknowledge consent to treatment? yes  Jacinta Shoe, [place] 07/06/2018 1:18 PM   DOWNLOADING THE [place]  - If Apple, go to [employer] and type in WebEx in the search bar. [place] [employer], the blue/green circle. The app is free but as with any other app downloads, their phone may require them to verify saved payment information or Apple password. The patient does NOT have to create an account.  - If Android, ask patient to go to [employer] and type in WebEx in the search bar. [place] [employer], the blue/green circle. The app is free but as with any other app downloads, their phone may require them to verify saved payment information or Android password. The patient does NOT have to create an account.   CONSENT FOR TELE-HEALTH VISIT - PLEASE REVIEW  I hereby voluntarily request, consent and authorize [facility] and its employed or contracted physicians, physician assistants, nurse practitioners or other licensed health care professionals (the Practitioner), to provide me with telemedicine health care services (the "Services") as deemed necessary by the treating Practitioner.  I acknowledge and consent to receive the Services by the Practitioner via telemedicine. I understand that the telemedicine visit will involve communicating with the Practitioner through live audiovisual communication technology and the disclosure of certain medical information by electronic transmission. I acknowledge that I have been given the opportunity to request an in-person assessment or other available alternative prior to the telemedicine visit and am  voluntarily participating in the telemedicine visit.  I understand that I have the right to withhold or withdraw my consent to the use of telemedicine in the course of my care at any time, without affecting my right to future care or treatment, and that the Practitioner or I may terminate the telemedicine visit at any time. I understand that I have the right to inspect all information obtained and/or recorded in the course of the telemedicine visit and may receive copies of available information for a reasonable fee.  I understand that some of the potential risks of receiving the Services via telemedicine include:  Marland Kitchen Delay or interruption in medical evaluation due to technological equipment failure or disruption; . Information transmitted may not be sufficient (e.g. poor resolution of images) to allow for appropriate medical decision making by the Practitioner; and/or  . In rare instances, security protocols could fail, causing a breach of personal health information.  Furthermore, I acknowledge that it is my responsibility to provide information about my medical history, conditions and care that is complete and accurate to the best of my ability. I acknowledge that Practitioner's advice, recommendations, and/or decision may be based on factors not within their control, such as incomplete or inaccurate data provided by me or distortions of diagnostic images or specimens that may result from electronic transmissions. I understand that the practice of medicine is not an exact science and that Practitioner makes no warranties or guarantees regarding treatment outcomes. I acknowledge that I will receive a copy of this consent concurrently upon execution via email to the email address I last provided but may also request a printed copy by calling the office of [place].    I understand that my insurance will be billed for this visit.   I have read or had this consent read to me. . I understand the  contents of this consent, which adequately explains the benefits and risks of the Services being provided via telemedicine.  . I have been provided ample opportunity to ask questions regarding this consent and the Services and have had my questions answered to my satisfaction. . I give my informed consent for the services to be provided through the use of telemedicine in my medical care  By participating in this telemedicine visit I agree to the above.

## 2018-07-06 NOTE — Telephone Encounter (Signed)
Spoke with pt who is agreeable to use doximity for his virtual visit with Dr. Johnsie Cancel on 4/16 at 4:00 pm. Pt has been aware that he will receive a phone call 30 minutes prior to scheduled appt time. Pt has verified phone # that is on file is correct. Pt verbalized understanding and thanked me for the call.

## 2018-07-12 NOTE — Progress Notes (Signed)
Virtual Visit via Video Note   This visit type was conducted due to national recommendations for restrictions regarding the COVID-19 Pandemic (e.g. social distancing) in an effort to limit this patient's exposure and mitigate transmission in our community.  Due to his co-morbid illnesses, this patient is at least at moderate risk for complications without adequate follow up.  This format is felt to be most appropriate for this patient at this time.  All issues noted in this document were discussed and addressed.  A limited physical exam was performed with this format.  Please refer to the patient's chart for his consent to telehealth for [facility].   Evaluation Performed:  Follow-up visit  Date:  07/13/2018   ID:  Carlos Harrison, DOB 02-24-1961, MRN [id number]  Patient Location: Home Provider Location: Office  PCP:  [name], MD  Cardiologist:  Jenkins Rouge, MD   Electrophysiologist:  None   Chief Complaint:  PAF/CHF  History of Present Illness:    58 y.o. HTN, HLD, PAF and chronic systolic CHF. 0/86/57 admitted with rapid afib/flutter. TEE/DCC 11/16/17 EF during arrhythmia 15-20%Improved since [facility]. Seen by Sherian Rein D and now on entresto 49/51 mg, aldactone 12.5 mg, and Toprol 50 mg Anticoagulation with normal dose xarelto.   No chest pain feels better rare dizziness on meds No palpitations or evidence of recurrent afib   Last echo done 02/15/18 reviewed EF improved to 45-50% Still needs to have CAD r/o  Doing well asked about coming off xarelto but with his CHF think risk of recurrent PAF is high  Home with all his kids during [place] crisis not very active but no cardiac Complaints   The patient does not have symptoms concerning for COVID-19 infection (fever, chills, cough, or new shortness of breath).    Past Medical History:  Diagnosis Date  . Atrial fibrillation ([place]) 10/2017  . Cardiomyopathy ([place]) 11/10/2017  . Dyspnea   . Hypertension   . Palpitations  10/26/2017   Past Surgical History:  Procedure Laterality Date  . CARDIOVERSION N/A 11/16/2017   Procedure: CARDIOVERSION;  Surgeon: Acie Fredrickson Wonda Cheng, MD;  Location: [place];  Service: Cardiovascular;  Laterality: N/A;  . EYE SURGERY Left   . TEE WITH CARDIOVERSION  11/16/2017  . TEE WITHOUT CARDIOVERSION N/A 11/16/2017   Procedure: TRANSESOPHAGEAL ECHOCARDIOGRAM (TEE);  Surgeon: Acie Fredrickson Wonda Cheng, MD;  Location: [facility] ENDOSCOPY;  Service: Cardiovascular;  Laterality: N/A;     Current Meds  Medication Sig  . ENTRESTO 49-51 MG TAKE 1 TABLET BY MOUTH TWICE A DAY  . metoprolol succinate (TOPROL-XL) 50 MG 24 hr tablet Take 1 tablet (50 mg total) by mouth daily.  . rosuvastatin (CRESTOR) 10 MG tablet TAKE 1 TABLET BY MOUTH EVERY DAY  . XARELTO 20 MG TABS tablet TAKE 1 TABLET (20 MG TOTAL) BY MOUTH DAILY WITH SUPPER.     Allergies:   Patient has no known allergies.   Social History   Tobacco Use  . Smoking status: Never Smoker  . Smokeless tobacco: Never Used  Substance Use Topics  . Alcohol use: No  . Drug use: No     Family Hx: The patient's family history includes Ovarian cancer in his mother.  ROS:   Please see the history of present illness.     All other systems reviewed and are negative.   Prior CV studies:   The following studies were reviewed today:  Echo November 2019  Labs/Other Tests and Data Reviewed:    EKG: SR  rate 54 normal 01/23/18  Recent Labs: 11/15/2017: B Natriuretic Peptide 640.2 04/26/2018: ALT 33; BUN 10; Creatinine, Ser 0.94; Hemoglobin 12.4; Platelets 159.0; Potassium 4.2; Sodium 139; TSH 2.15   Recent Lipid Panel Lab Results  Component Value Date/Time   CHOL 179 04/26/2018 02:47 PM   TRIG 115.0 04/26/2018 02:47 PM   HDL 48.20 04/26/2018 02:47 PM   CHOLHDL 4 04/26/2018 02:47 PM   LDLCALC 108 (H) 04/26/2018 02:47 PM    Wt Readings from Last 3 Encounters:  07/13/18 99.3 kg  05/24/18 95.3 kg  05/19/18 98 kg     Objective:    Vital  Signs:  Ht 6\' 4"  (1.93 m)   Wt 99.3 kg   BMI 26.66 kg/m    Black male in no distress Skin warm and dry No JVP elevation No tachypnea No edema  ASSESSMENT & PLAN:    1. PAF: maintaining NSR continue Lopresor and xarelto   2. DCM:  EF improved on goal directed Rx including enteresto EF 45-50% by TTE 01/2018 need to r/o CAD will arrange cardiac CT when COVID 19 restrictions lifted 3. HLD:  On statin labs with primary   COVID-19 Education: The signs and symptoms of COVID-19 were discussed with the patient and how to seek care for testing (follow up with PCP or arrange E-visit).  The importance of social distancing was discussed today.  Time:   Today, I have spent 30 minutes with the patient with telehealth technology discussing the above problems.     Medication Adjustments/Labs and Tests Ordered: Current medicines are reviewed at length with the patient today.  Concerns regarding medicines are outlined above.   Tests Ordered:  Cardiac CTA r/o CAD DCM and CHF   Medication Changes: No orders of the defined types were placed in this encounter.   Disposition:  Follow up 6 months   Signed, Jenkins Rouge, MD  07/13/2018 3:52 PM    [facility]

## 2018-07-13 ENCOUNTER — Other Ambulatory Visit: Payer: Self-pay

## 2018-07-13 ENCOUNTER — Telehealth (INDEPENDENT_AMBULATORY_CARE_PROVIDER_SITE_OTHER): Payer: BLUE CROSS/BLUE SHIELD | Admitting: Cardiovascular Disease

## 2018-07-13 ENCOUNTER — Encounter: Payer: Self-pay | Admitting: Cardiovascular Disease

## 2018-07-13 VITALS — Ht 76.0 in | Wt 219.0 lb

## 2018-07-13 DIAGNOSIS — I429 Cardiomyopathy, unspecified: Secondary | ICD-10-CM

## 2018-07-13 DIAGNOSIS — I5022 Chronic systolic (congestive) heart failure: Secondary | ICD-10-CM

## 2018-07-13 NOTE — Patient Instructions (Addendum)
Medication Instructions:   If you need a refill on your cardiac medications before your next appointment, please call your pharmacy.   Lab work:  If you have labs (blood work) drawn today and your tests are completely normal, you will receive your results only by: Marland Kitchen MyChart Message (if you have MyChart) OR . A paper copy in the mail If you have any lab test that is abnormal or we need to change your treatment, we will call you to review the results.  Testing/Procedures: Your physician has requested that you have cardiac CT in 8 weeks. Cardiac computed tomography (CT) is a painless test that uses an x-ray machine to take clear, detailed pictures of your heart. For further information please visit [email or web address][email or web address]. Please follow instruction sheet as given.  Follow-Up: At [facility], you and your health needs are our priority.  As part of our continuing mission to provide you with exceptional heart care, we have created designated Provider Care Teams.  These Care Teams include your primary Cardiologist (physician) and Advanced Practice Providers (APPs -  Physician Assistants and Nurse Practitioners) who all work together to provide you with the care you need, when you need it. You will need a follow up appointment in 6 months.  Please call our office 2 months in advance to schedule this appointment.  You may see Jenkins Rouge, MD or one of the following Advanced Practice Providers on your designated Care Team:   Truitt Merle, NP Cecilie Kicks, NP . Kathyrn Drown, NP   Someone from our office will call you with an appointment for the CT.    Please arrive at the [facility] main entrance of [facility] at xx:xx AM (30-45 minutes prior to test start time)  [facility] [place], [place] [address] [phone]  Proceed to the [facility] Radiology Department (First Floor).  Please follow these instructions carefully (unless otherwise  directed):  Hold all erectile dysfunction medications at least 48 hours prior to test.  On the Night Before the Test: . Be sure to Drink plenty of water. . Do not consume any caffeinated/decaffeinated beverages or chocolate 12 hours prior to your test. . Do not take any antihistamines 12 hours prior to your test.  On the Day of the Test: . Drink plenty of water. Do not drink any water within one hour of the test. . Do not eat any food 4 hours prior to the test. . You may take your regular medications prior to the test.  . Take metoprolol (Lopressor) two hours prior to test.      After the Test: . Drink plenty of water. . After receiving IV contrast, you may experience a mild flushed feeling. This is normal. . On occasion, you may experience a mild rash up to 24 hours after the test. This is not dangerous. If this occurs, you can take Benadryl 25 mg and increase your fluid intake. . If you experience trouble breathing, this can be serious. If it is severe call 911 IMMEDIATELY. If it is mild, please call our office.

## 2018-08-03 ENCOUNTER — Telehealth: Payer: Self-pay | Admitting: Cardiovascular Disease

## 2018-08-03 NOTE — Telephone Encounter (Signed)
Left message to call and schedule Ct in 8 weeks per Dr. Johnsie Cancel.

## 2018-08-08 ENCOUNTER — Other Ambulatory Visit: Payer: Self-pay

## 2018-08-08 DIAGNOSIS — I429 Cardiomyopathy, unspecified: Secondary | ICD-10-CM

## 2018-08-08 DIAGNOSIS — Z01812 Encounter for preprocedural laboratory examination: Secondary | ICD-10-CM

## 2018-08-08 NOTE — Progress Notes (Signed)
bmet before CT

## 2018-09-08 ENCOUNTER — Telehealth (HOSPITAL_COMMUNITY): Payer: Self-pay | Admitting: Emergency Medicine

## 2018-09-08 NOTE — Telephone Encounter (Signed)
error 

## 2018-09-12 ENCOUNTER — Other Ambulatory Visit: Payer: Self-pay

## 2018-09-12 DIAGNOSIS — Z01812 Encounter for preprocedural laboratory examination: Secondary | ICD-10-CM

## 2018-09-12 DIAGNOSIS — I429 Cardiomyopathy, unspecified: Secondary | ICD-10-CM

## 2018-09-12 LAB — BASIC METABOLIC PANEL
BUN/Creatinine Ratio: 9 (ref 9–20)
BUN: 11 mg/dL (ref 6–24)
CO2: 23 mmol/L (ref 20–29)
Calcium: 9.4 mg/dL (ref 8.7–10.2)
Chloride: 102 mmol/L (ref 96–106)
Creatinine, Ser: 1.26 mg/dL (ref 0.76–1.27)
GFR calc Af Amer: 73 mL/min/{1.73_m2} (ref >59)
GFR calc non Af Amer: 63 mL/min/{1.73_m2} (ref >59)
Glucose: 131 mg/dL — ABNORMAL HIGH (ref 65–99)
Potassium: 3.8 mmol/L (ref 3.5–5.2)
Sodium: 139 mmol/L (ref 134–144)

## 2018-09-18 ENCOUNTER — Telehealth (HOSPITAL_COMMUNITY): Payer: Self-pay | Admitting: Emergency Medicine

## 2018-09-18 NOTE — Telephone Encounter (Signed)
Reaching out to patient to offer assistance regarding upcoming cardiac imaging study; pt verbalizes understanding of appt date/time, parking situation and where to check in, pre-test NPO status and medications ordered, and verified current allergies; name and call back number provided for further questions should they arise Sara Wallace RN Navigator Cardiac Imaging [facility] [phone] office [phone] cell  Pt denies covid symptoms, verbalized understanding of visitor policy. 

## 2018-09-19 ENCOUNTER — Ambulatory Visit (HOSPITAL_COMMUNITY)
Admission: RE | Admit: 2018-09-19 | Discharge: 2018-09-19 | Disposition: A | Discharge status: Home / Self Care | Payer: Self-pay | Source: Ambulatory Visit | Attending: Cardiovascular Disease | Admitting: Cardiovascular Disease

## 2018-09-19 ENCOUNTER — Other Ambulatory Visit: Payer: Self-pay

## 2018-09-19 ENCOUNTER — Telehealth: Payer: Self-pay

## 2018-09-19 ENCOUNTER — Encounter (HOSPITAL_COMMUNITY): Payer: Self-pay

## 2018-09-19 ENCOUNTER — Encounter: Payer: Self-pay | Admitting: Internal Medicine

## 2018-09-19 DIAGNOSIS — I429 Cardiomyopathy, unspecified: Secondary | ICD-10-CM

## 2018-09-19 MED ORDER — NITROGLYCERIN 0.4 MG SL SUBL
SUBLINGUAL_TABLET | SUBLINGUAL | Status: AC
  Filled 2018-09-19: qty 2

## 2018-09-19 MED ORDER — NITROGLYCERIN 0.4 MG SL SUBL
0.8000 mg | SUBLINGUAL_TABLET | Freq: Once | SUBLINGUAL | Status: AC
  Administered 2018-09-19: 0.8 mg via SUBLINGUAL
  Filled 2018-09-19: qty 25

## 2018-09-19 MED ORDER — IOHEXOL 350 MG/ML SOLN
100.0000 mL | Freq: Once | INTRAVENOUS | Status: AC | PRN
  Administered 2018-09-19: 100 mL via INTRAVENOUS

## 2018-09-19 NOTE — Telephone Encounter (Signed)
Called patient with results for CT. Patient stated he has lost his job and insurance and is out of [employer]. Will send a message to pre auth nurse to see if there is anything she can do to help. The office did have some samples. Will have patient pick up samples to hold him over until something can be worked out.

## 2018-09-20 NOTE — Telephone Encounter (Signed)
I discussed pt asst through [employer] with the pt who is interested in applying. I gave him the phone number to reach [employer] pt asst [employer] and request that they mail him an application. He verbalized understanding and states that he will call them. He thanked me for calling him.

## 2018-10-09 ENCOUNTER — Other Ambulatory Visit: Payer: Self-pay | Admitting: Internal Medicine

## 2018-10-17 ENCOUNTER — Telehealth: Payer: Self-pay

## 2018-10-17 NOTE — Telephone Encounter (Signed)
I have completed the provider portion of both Pt Asst applications and had Dr Tamala Julian sign them (he was DOD) Both have been faxed.

## 2018-10-19 ENCOUNTER — Other Ambulatory Visit: Payer: Self-pay

## 2018-10-19 MED ORDER — ENTRESTO 49-51 MG PO TABS: 1.0000 | ORAL_TABLET | Freq: Two times a day (BID) | ORAL | 3 refills | Status: DC

## 2018-10-19 NOTE — Telephone Encounter (Addendum)
Letter received from [employer] stating that they have approved the pt for pt asst with his Entresto. Approval good from today until 10/19/2019. Pt ID: [id number]  I have notified the pt through [facility] of this approval.

## 2018-10-20 NOTE — Telephone Encounter (Signed)
Letter received from [place] stating that they have approved the pt for pt assistance with his Xarelto. Approval good from 10/19/2018 until 10/19/2019.  The letter states that they have notified the pt of this approval.

## 2018-10-24 NOTE — Progress Notes (Signed)
Subjective:    Patient ID: Carlos Harrison, male    DOB: 15-Jan-1961, 58 y.o.   MRN: [id number]  HPI The patient is here for follow up.  He is exercising regularly - walking.  He is eating better.       HFrEF, Aflutter, Hypertension: He is taking his medication daily. He is compliant with a low sodium diet.  He denies chest pain, palpitations, edema, shortness of breath and regular headaches.  He does not monitor his blood pressure at home.   Hyperlipidemia: He is taking his medication daily. He is compliant with a low fat/cholesterol diet. He denies myalgias.   Prediabetes:  He is compliant with a low sugar/carbohydrate diet.  He is exercising regularly.    Medications and allergies reviewed with patient and updated if appropriate.  Patient Active Problem List   Diagnosis Date Noted  . Neck pain 04/26/2018  . Dilated cardiomyopathy ([place]) 04/26/2018  . Atrial flutter with rapid ventricular response ([place]) 10/26/2017  . Prediabetes 04/27/2017  . Hypertension 02/21/2017  . Hyperlipidemia 02/21/2017  . Sacroiliac joint dysfunction of right side 07/30/2014    Current Outpatient Medications on File Prior to Visit  Medication Sig Dispense Refill  . metoprolol succinate (TOPROL-XL) 50 MG 24 hr tablet Take 1 tablet (50 mg total) by mouth daily. 90 tablet 3  . rosuvastatin (CRESTOR) 10 MG tablet TAKE 1 TABLET BY MOUTH EVERY DAY 90 tablet 0  . sacubitril-valsartan (ENTRESTO) 49-51 MG Take 1 tablet by mouth 2 (two) times daily. 180 tablet 3  . XARELTO 20 MG TABS tablet TAKE 1 TABLET (20 MG TOTAL) BY MOUTH DAILY WITH SUPPER. 90 tablet 0  . spironolactone (ALDACTONE) 25 MG tablet Take 0.5 tablets (12.5 mg total) by mouth daily. 90 tablet 3   No current facility-administered medications on file prior to visit.     Past Medical History:  Diagnosis Date  . Atrial fibrillation ([place]) 10/2017  . Cardiomyopathy ([place]) 11/10/2017  . Dyspnea   . Hypertension   . Palpitations 10/26/2017     Past Surgical History:  Procedure Laterality Date  . CARDIOVERSION N/A 11/16/2017   Procedure: CARDIOVERSION;  Surgeon: Acie Fredrickson Wonda Cheng, MD;  Location: [place];  Service: Cardiovascular;  Laterality: N/A;  . EYE SURGERY Left   . TEE WITH CARDIOVERSION  11/16/2017  . TEE WITHOUT CARDIOVERSION N/A 11/16/2017   Procedure: TRANSESOPHAGEAL ECHOCARDIOGRAM (TEE);  Surgeon: Acie Fredrickson Wonda Cheng, MD;  Location: [facility] ENDOSCOPY;  Service: Cardiovascular;  Laterality: N/A;    Social History   Socioeconomic History  . Marital status: Married    Spouse name: Not on file  . Number of children: 77  . Years of education: 41  . Highest education level: Not on file  Occupational History  . Occupation: [employer]  Social Needs  . Financial resource strain: Not on file  . Food insecurity    Worry: Not on file    Inability: Not on file  . Transportation needs    Medical: Not on file    Non-medical: Not on file  Tobacco Use  . Smoking status: Never Smoker  . Smokeless tobacco: Never Used  Substance and Sexual Activity  . Alcohol use: No  . Drug use: No  . Sexual activity: Not on file  Lifestyle  . Physical activity    Days per week: Not on file    Minutes per session: Not on file  . Stress: Not on file  Relationships  . Social connections  Talks on phone: Not on file    Gets together: Not on file    Attends religious service: Not on file    Active member of club or organization: Not on file    Attends meetings of clubs or organizations: Not on file    Relationship status: Not on file  Other Topics Concern  . Not on file  Social History Narrative   Fun: Mentoring youth, solitary games.    Denies any religious beliefs effecting health care.     Family History  Problem Relation Age of Onset  . Ovarian cancer Mother     Review of Systems  Constitutional: Negative for chills and fever.  Respiratory: Negative for cough, shortness of breath and wheezing.   Cardiovascular:  Negative for chest pain, palpitations and leg swelling.  Neurological: Negative for dizziness, light-headedness and headaches.       Objective:   Vitals:   10/25/18 1401  BP: 126/84  Pulse: (!) 55  Resp: 16  Temp: 98 F (36.7 C)  SpO2: 99%   BP Readings from Last 3 Encounters:  10/25/18 126/84  09/19/18 121/82  09/19/18 (!) 147/96   Wt Readings from Last 3 Encounters:  10/25/18 239 lb (108.4 kg)  07/13/18 219 lb (99.3 kg)  05/24/18 210 lb (95.3 kg)   Body mass index is 29.09 kg/m.   Physical Exam    Constitutional: Appears well-developed and well-nourished. No distress.  HENT:  Head: Normocephalic and atraumatic.  Neck: Neck supple. No tracheal deviation present. No thyromegaly present.  No cervical lymphadenopathy Cardiovascular: Normal rate, regular rhythm and normal heart sounds.   No edema Pulmonary/Chest: Effort normal and breath sounds normal. No respiratory distress. No has no wheezes. No rales.  Skin: Skin is warm and dry. Not diaphoretic.  Psychiatric: Normal mood and affect. Behavior is normal.      Assessment & Plan:    See Problem List for Assessment and Plan of chronic medical problems.

## 2018-10-24 NOTE — Patient Instructions (Addendum)
  Tests ordered today. Your results will be released to MyChart (or called to you) after review.  If any changes need to be made, you will be notified at that same time.    Medications reviewed and updated.  Changes include :   none     Please followup in 6 months   

## 2018-10-25 ENCOUNTER — Other Ambulatory Visit (INDEPENDENT_AMBULATORY_CARE_PROVIDER_SITE_OTHER): Payer: Self-pay

## 2018-10-25 ENCOUNTER — Other Ambulatory Visit: Payer: Self-pay

## 2018-10-25 ENCOUNTER — Ambulatory Visit (INDEPENDENT_AMBULATORY_CARE_PROVIDER_SITE_OTHER): Payer: Self-pay | Admitting: Internal Medicine

## 2018-10-25 ENCOUNTER — Encounter: Payer: Self-pay | Admitting: Internal Medicine

## 2018-10-25 VITALS — BP 126/84 | HR 55 | Temp 98.0°F | Resp 16 | Ht 76.0 in | Wt 239.0 lb

## 2018-10-25 DIAGNOSIS — I10 Essential (primary) hypertension: Secondary | ICD-10-CM

## 2018-10-25 DIAGNOSIS — I4892 Unspecified atrial flutter: Secondary | ICD-10-CM

## 2018-10-25 DIAGNOSIS — R7303 Prediabetes: Secondary | ICD-10-CM

## 2018-10-25 DIAGNOSIS — E7849 Other hyperlipidemia: Secondary | ICD-10-CM

## 2018-10-25 DIAGNOSIS — I420 Dilated cardiomyopathy: Secondary | ICD-10-CM

## 2018-10-25 LAB — CBC WITH DIFFERENTIAL/PLATELET
Basophils Absolute: 0 10*3/uL (ref 0.0–0.1)
Basophils Relative: 0.5 % (ref 0.0–3.0)
Eosinophils Absolute: 0.2 10*3/uL (ref 0.0–0.7)
Eosinophils Relative: 5.1 % — ABNORMAL HIGH (ref 0.0–5.0)
HCT: 38.3 % — ABNORMAL LOW (ref 39.0–52.0)
Hemoglobin: 12.7 g/dL — ABNORMAL LOW (ref 13.0–17.0)
Lymphocytes Relative: 42.7 % (ref 12.0–46.0)
Lymphs Abs: 1.6 10*3/uL (ref 0.7–4.0)
MCHC: 33.2 g/dL (ref 30.0–36.0)
MCV: 87.6 fl (ref 78.0–100.0)
Monocytes Absolute: 0.3 10*3/uL (ref 0.1–1.0)
Monocytes Relative: 7.8 % (ref 3.0–12.0)
Neutro Abs: 1.7 10*3/uL (ref 1.4–7.7)
Neutrophils Relative %: 43.9 % (ref 43.0–77.0)
Platelets: 166 10*3/uL (ref 150.0–400.0)
RBC: 4.37 Mil/uL (ref 4.22–5.81)
RDW: 14 % (ref 11.5–15.5)
WBC: 3.9 10*3/uL — ABNORMAL LOW (ref 4.0–10.5)

## 2018-10-25 LAB — COMPREHENSIVE METABOLIC PANEL
ALT: 18 U/L (ref 0–53)
AST: 19 U/L (ref 0–37)
Albumin: 4.4 g/dL (ref 3.5–5.2)
Alkaline Phosphatase: 43 U/L (ref 39–117)
BUN: 13 mg/dL (ref 6–23)
CO2: 28 mEq/L (ref 19–32)
Calcium: 9.3 mg/dL (ref 8.4–10.5)
Chloride: 106 mEq/L (ref 96–112)
Creatinine, Ser: 1.05 mg/dL (ref 0.40–1.50)
GFR: 88.35 mL/min (ref >60.00)
Glucose, Bld: 91 mg/dL (ref 70–99)
Potassium: 3.7 mEq/L (ref 3.5–5.1)
Sodium: 141 mEq/L (ref 135–145)
Total Bilirubin: 0.6 mg/dL (ref 0.2–1.2)
Total Protein: 7.4 g/dL (ref 6.0–8.3)

## 2018-10-25 LAB — LIPID PANEL
Cholesterol: 189 mg/dL (ref 0–200)
HDL: 48.1 mg/dL (ref >39.00)
LDL Cholesterol: 102 mg/dL — ABNORMAL HIGH (ref 0–99)
NonHDL: 141.26
Total CHOL/HDL Ratio: 4
Triglycerides: 194 mg/dL — ABNORMAL HIGH (ref 0.0–149.0)
VLDL: 38.8 mg/dL (ref 0.0–40.0)

## 2018-10-25 LAB — HEMOGLOBIN A1C: Hgb A1c MFr Bld: 6.1 % (ref 4.6–6.5)

## 2018-10-25 NOTE — Assessment & Plan Note (Signed)
BP well controlled Current regimen effective and well tolerated Continue current medications at current doses cmp  

## 2018-10-25 NOTE — Assessment & Plan Note (Signed)
Check a1c Low sugar / carb diet Stressed regular exercise   

## 2018-10-25 NOTE — Assessment & Plan Note (Signed)
Doing well, euvolemnic Following with cardiology

## 2018-10-25 NOTE — Assessment & Plan Note (Signed)
Check lipid panel  Continue daily statin Regular exercise and healthy diet encouraged  

## 2018-10-25 NOTE — Assessment & Plan Note (Signed)
Asymptomatic Following with cardiology Continue current meds Cbc, cmp

## 2018-10-26 ENCOUNTER — Encounter: Payer: Self-pay | Admitting: Internal Medicine

## 2018-12-13 NOTE — Telephone Encounter (Signed)
Spoke with the pt and he reports that he took his night meds this morning accidentally and he took his morning Entresto... so he had both doses already.   After talking with the [facility].. pt advised to take his next Xarelto at lunch time tomorrow then back to dinner time the next day after which is his normal routine.Marland Kitchen He will monitor his BP this afternoon before taking the metoprolol,and Spironolactone.   Pt will call if his BP drops and will drink plenty of fluids today to maintain a good BP.

## 2019-01-15 ENCOUNTER — Other Ambulatory Visit: Payer: Self-pay | Admitting: Internal Medicine

## 2019-01-20 ENCOUNTER — Other Ambulatory Visit: Payer: Self-pay | Admitting: Internal Medicine

## 2019-01-21 ENCOUNTER — Other Ambulatory Visit: Payer: Self-pay | Admitting: Internal Medicine

## 2019-01-22 ENCOUNTER — Encounter: Payer: Self-pay | Admitting: Internal Medicine

## 2019-01-29 MED ORDER — ENTRESTO 49-51 MG PO TABS: 1.0000 | ORAL_TABLET | Freq: Two times a day (BID) | ORAL | 0 refills | Status: DC

## 2019-01-29 NOTE — Telephone Encounter (Signed)
Called patient about mychart message. Informed patient that we can send in a few pills to hold him over until he gets his mail order in. Patient agreed to plan and will try a goodrx coupon. Informed patient to contact office for goodrx card it not able to get one. Patient verbalized understanding.

## 2019-01-31 ENCOUNTER — Ambulatory Visit: Payer: Self-pay | Admitting: Cardiovascular Disease

## 2019-03-21 ENCOUNTER — Other Ambulatory Visit: Payer: Self-pay | Admitting: Cardiovascular Disease

## 2019-04-09 NOTE — Progress Notes (Deleted)
Evaluation Performed:  Follow-up visit  Date:  04/09/2019   ID:  Carlos Harrison, DOB 09/18/1960, MRN [id number]  Provider Location: Office  PCP:  [name], MD  Cardiologist:  Jenkins Rouge, MD   Electrophysiologist:  None   Chief Complaint:  PAF/CHF  History of Present Illness:    59 y.o. HTN, HLD, PAF and chronic systolic CHF. [id number] admitted with rapid afib/flutter. TEE/DCC 11/16/17 EF during arrhythmia 15-20%Improved since [facility]. Seen by Sherian Rein D and now on entresto 49/51 mg, aldactone 12.5 mg, and Toprol 50 mg Anticoagulation with normal dose xarelto.   No chest pain feels better rare dizziness on meds No palpitations or evidence of recurrent afib   Last echo done 02/15/18 reviewed EF improved to 45-50% Still needs to have CAD r/o  Doing well asked about coming off xarelto but with his CHF think risk of recurrent PAF is high  Home with all his kids during [place] crisis not very active but no cardiac Complaints   Cardiac CTA done 09/19/18 showed calcium score 0 and normal right dominant coronary arteries   The patient does not have symptoms concerning for COVID-19 infection (fever, chills, cough, or new shortness of breath).    Past Medical History:  Diagnosis Date  . Atrial fibrillation ([place]) 10/2017  . Cardiomyopathy ([place]) 11/10/2017  . Dyspnea   . Hypertension   . Palpitations 10/26/2017   Past Surgical History:  Procedure Laterality Date  . CARDIOVERSION N/A 11/16/2017   Procedure: CARDIOVERSION;  Surgeon: Acie Fredrickson Wonda Cheng, MD;  Location: [place];  Service: Cardiovascular;  Laterality: N/A;  . EYE SURGERY Left   . TEE WITH CARDIOVERSION  11/16/2017  . TEE WITHOUT CARDIOVERSION N/A 11/16/2017   Procedure: TRANSESOPHAGEAL ECHOCARDIOGRAM (TEE);  Surgeon: Acie Fredrickson Wonda Cheng, MD;  Location: [facility] ENDOSCOPY;  Service: Cardiovascular;  Laterality: N/A;     No outpatient medications have been marked as taking for the 04/17/19 encounter (Appointment) with Josue Hector, MD.     Allergies:   Patient has no known allergies.   Social History   Tobacco Use  . Smoking status: Never Smoker  . Smokeless tobacco: Never Used  Substance Use Topics  . Alcohol use: No  . Drug use: No     Family Hx: The patient's family history includes Ovarian cancer in his mother.  ROS:   Please see the history of present illness.     All other systems reviewed and are negative.   Prior CV studies:   The following studies were reviewed today:  Echo November 2019  Labs/Other Tests and Data Reviewed:    EKG: SR rate 54 normal 01/23/18  Recent Labs: 04/26/2018: TSH 2.15 10/25/2018: ALT 18; BUN 13; Creatinine, Ser 1.05; Hemoglobin 12.7; Platelets 166.0; Potassium 3.7; Sodium 141   Recent Lipid Panel Lab Results  Component Value Date/Time   CHOL 189 10/25/2018 02:28 PM   TRIG 194.0 (H) 10/25/2018 02:28 PM   HDL 48.10 10/25/2018 02:28 PM   CHOLHDL 4 10/25/2018 02:28 PM   LDLCALC 102 (H) 10/25/2018 02:28 PM    Wt Readings from Last 3 Encounters:  10/25/18 239 lb (108.4 kg)  07/13/18 219 lb (99.3 kg)  05/24/18 210 lb (95.3 kg)     Objective:    Vital Signs:  There were no vitals taken for this visit.   Black male in no distress Skin warm and dry No JVP elevation No tachypnea No edema  ASSESSMENT & PLAN:    1. PAF: maintaining NSR continue  Lopresor and xarelto   2. DCM:  EF improved on goal directed Rx including enteresto EF 45-50% by TTE 01/2018 Cardiac CTA June 2020 with calcium score 0 and normal right dominant coronary arteries  3. HLD:  On statin labs with primary   COVID-19 Education: The signs and symptoms of COVID-19 were discussed with the patient and how to seek care for testing (follow up with PCP or arrange E-visit).  The importance of social distancing was discussed today.   Medication Adjustments/Labs and Tests Ordered: Current medicines are reviewed at length with the patient today.  Concerns regarding medicines are  outlined above.   Tests Ordered:  ***  Medication Changes: No orders of the defined types were placed in this encounter.   Disposition:  Follow up  In a year   Signed, Jenkins Rouge, MD  04/09/2019 12:13 PM    [place]

## 2019-04-15 NOTE — Progress Notes (Signed)
Virtual Visit via Video Note   This visit type was conducted due to national recommendations for restrictions regarding the COVID-19 Pandemic (e.g. social distancing) in an effort to limit this patient's exposure and mitigate transmission in our community.  Due to her co-morbid illnesses, this patient is at least at moderate risk for complications without adequate follow up.  This format is felt to be most appropriate for this patient at this time.  All issues noted in this document were discussed and addressed.  A limited physical exam was performed with this format.  Please refer to the patient's chart for her consent to telehealth for [facility].   Evaluation Performed:  Follow-up visit  Date:  04/19/2019   ID:  Carlos Harrison, DOB Aug 27, 1960, MRN [id number]  Provider Location: Office  Patient Location Home   PCP:  [name], MD  Cardiologist:  Jenkins Rouge, MD   Electrophysiologist:  None   Chief Complaint:  PAF/CHF  History of Present Illness:    59 y.o. HTN, HLD, PAF and chronic systolic CHF. [id number] admitted with rapid afib/flutter. TEE/DCC 11/16/17 EF during arrhythmia 15-20%Improved since [facility]. Seen by Sherian Rein D and now on entresto 49/51 mg, aldactone 12.5 mg, and Toprol 50 mg Anticoagulation with normal dose xarelto.   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    Past Medical History:  Diagnosis Date  . Atrial fibrillation ([place]) 10/2017  . Cardiomyopathy ([place]) 11/10/2017  . Dyspnea   . Hypertension   . Palpitations 10/26/2017   Past Surgical History:  Procedure Laterality Date  . CARDIOVERSION N/A 11/16/2017   Procedure: CARDIOVERSION;  Surgeon: Acie Fredrickson Wonda Cheng, MD;  Location: [place];  Service: Cardiovascular;  Laterality: N/A;  . EYE SURGERY Left   . TEE WITH CARDIOVERSION  11/16/2017  . TEE WITHOUT CARDIOVERSION N/A 11/16/2017   Procedure: TRANSESOPHAGEAL ECHOCARDIOGRAM (TEE);  Surgeon: Thayer Headings, MD;  Location[facility] ENDOSCOPY;  Service: Cardiovascular;  Laterality: N/A;     Current Meds  Medication Sig  . metoprolol succinate (TOPROL-XL) 50 MG 24 hr tablet Take 1 tablet (50 mg total) by mouth daily.  . rosuvastatin (CRESTOR) 10 MG tablet Take 1 tablet by mouth once daily  . sacubitril-valsartan (ENTRESTO) 49-51 MG Take 1 tablet by mouth 2 (two) times daily.  Marland Kitchen spironolactone (ALDACTONE) 25 MG tablet TAKE 1/2 TABLET BY MOUTH EVERY DAY  . XARELTO 20 MG TABS tablet TAKE 1 TABLET (20 MG TOTAL) BY MOUTH DAILY WITH SUPPER.     Allergies:   Patient has no known allergies.   Social History   Tobacco Use  . Smoking status: Never Smoker  . Smokeless tobacco: Never Used  Substance Use Topics  . Alcohol use: No  . Drug use: No     Family Hx: The patient's family history includes Ovarian cancer in his mother.  ROS:  Please see the history of present illness.     All other systems reviewed and are negative.   Prior CV studies:   The following studies were reviewed today:  Echo November 2019  Labs/Other Tests and Data Reviewed:    EKG: SR rate 54 normal 01/23/18  Recent Labs: 04/26/2018: TSH 2.15 10/25/2018: ALT 18; BUN 13; Creatinine, Ser 1.05; Hemoglobin 12.7; Platelets 166.0; Potassium 3.7; Sodium 141   Recent Lipid Panel Lab Results  Component Value Date/Time   CHOL 189 10/25/2018 02:28 PM   TRIG  194.0 (H) 10/25/2018 02:28 PM   HDL 48.10 10/25/2018 02:28 PM   CHOLHDL 4 10/25/2018 02:28 PM   LDLCALC 102 (H) 10/25/2018 02:28 PM    Wt Readings from Last 3 Encounters:  10/25/18 239 lb (108.4 kg)  07/13/18 219 lb (99.3 kg)  05/24/18 210 lb (95.3 kg)     Objective:    Vital Signs:  BP (!) 150/100   Pulse (!) 68    Black male in no distress Skin warm and dry No JVP elevation No tachypnea No edema   ASSESSMENT & PLAN:    1. PAF: maintaining NSR continue Lopresor and xarelto   2. DCM:  EF improved on goal directed Rx including enteresto EF 45-50% by TTE 01/2018 Cardiac CTA June 2020 with calcium score 0 and normal right dominant coronary arteries  3. HLD:  On statin labs with primary   COVID-19 Education: The signs and symptoms of COVID-19 were discussed with the patient and how to seek care for testing (follow up with PCP or arrange E-visit).  The importance of social distancing was discussed today.   Medication Adjustments/Labs and Tests Ordered: Current medicines are reviewed at length with the patient today.  Concerns regarding medicines are outlined above.   Tests Ordered:  Lab work with primary in February   Medication Changes: No orders of the defined types were placed in this encounter.  Time spent reviewing chart , writing note and direct televisit 20 mintues   Disposition:  Follow up  In a year   Signed, Jenkins Rouge, MD  04/19/2019 8:49 AM    [place]

## 2019-04-17 ENCOUNTER — Ambulatory Visit: Payer: Self-pay | Admitting: Cardiovascular Disease

## 2019-04-19 ENCOUNTER — Other Ambulatory Visit: Payer: Self-pay | Admitting: Internal Medicine

## 2019-04-19 ENCOUNTER — Other Ambulatory Visit: Payer: Self-pay

## 2019-04-19 ENCOUNTER — Telehealth (INDEPENDENT_AMBULATORY_CARE_PROVIDER_SITE_OTHER): Payer: Self-pay | Admitting: Cardiovascular Disease

## 2019-04-19 VITALS — BP 150/100 | HR 59

## 2019-04-19 DIAGNOSIS — I420 Dilated cardiomyopathy: Secondary | ICD-10-CM

## 2019-04-19 MED ORDER — METOPROLOL SUCCINATE ER 50 MG PO TB24: 50.0000 mg | ORAL_TABLET | Freq: Every day | ORAL | 3 refills | Status: DC

## 2019-04-19 NOTE — Patient Instructions (Signed)
Medication Instructions:   *If you need a refill on your cardiac medications before your next appointment, please call your pharmacy*  Lab Work:  If you have labs (blood work) drawn today and your tests are completely normal, you will receive your results only by: . MyChart Message (if you have MyChart) OR . A paper copy in the mail If you have any lab test that is abnormal or we need to change your treatment, we will call you to review the results.  Follow-Up: At [facility], you and your health needs are our priority.  As part of our continuing mission to provide you with exceptional heart care, we have created designated Provider Care Teams.  These Care Teams include your primary Cardiologist (physician) and Advanced Practice Providers (APPs -  Physician Assistants and Nurse Practitioners) who all work together to provide you with the care you need, when you need it.  Your next appointment:   6 months  The format for your next appointment:   In Person  Provider:   You may see Peter Nishan, MD or one of the following Advanced Practice Providers on your designated Care Team:    Lori Gerhardt, NP  Laura Ingold, NP  Jill McDaniel, NP    

## 2019-04-20 ENCOUNTER — Other Ambulatory Visit: Payer: Self-pay | Admitting: Internal Medicine

## 2019-04-22 ENCOUNTER — Other Ambulatory Visit: Payer: Self-pay | Admitting: Internal Medicine

## 2019-04-28 ENCOUNTER — Encounter: Payer: Self-pay | Admitting: Internal Medicine

## 2019-04-30 MED ORDER — ROSUVASTATIN CALCIUM 10 MG PO TABS: ORAL_TABLET | ORAL | 1 refills | Status: DC

## 2019-05-01 NOTE — Patient Instructions (Addendum)
Blood work was ordered.    All other Health Maintenance issues reviewed.   All recommended immunizations and age-appropriate screenings are up-to-date or discussed.  Flu immunization administered today.    Medications reviewed and updated.  Changes include :   none  Your prescription(s) have been submitted to your pharmacy. Please take as directed and contact our office if you believe you are having problem(s) with the medication(s).   Monitor your BP at home.     Please followup in 6 months    Health Maintenance, Male Adopting a healthy lifestyle and getting preventive care are important in promoting health and wellness. Ask your health care provider about:  The right schedule for you to have regular tests and exams.  Things you can do on your own to prevent diseases and keep yourself healthy. What should I know about diet, weight, and exercise? Eat a healthy diet   Eat a diet that includes plenty of vegetables, fruits, low-fat dairy products, and lean protein.  Do not eat a lot of foods that are high in solid fats, added sugars, or sodium. Maintain a healthy weight Body mass index (BMI) is a measurement that can be used to identify possible weight problems. It estimates body fat based on height and weight. Your health care provider can help determine your BMI and help you achieve or maintain a healthy weight. Get regular exercise Get regular exercise. This is one of the most important things you can do for your health. Most adults should:  Exercise for at least 150 minutes each week. The exercise should increase your heart rate and make you sweat (moderate-intensity exercise).  Do strengthening exercises at least twice a week. This is in addition to the moderate-intensity exercise.  Spend less time sitting. Even light physical activity can be beneficial. Watch cholesterol and blood lipids Have your blood tested for lipids and cholesterol at 57 years of age, then have  this test every 5 years. You may need to have your cholesterol levels checked more often if:  Your lipid or cholesterol levels are high.  You are older than 56 years of age.  You are at high risk for heart disease. What should I know about cancer screening? Many types of cancers can be detected early and may often be prevented. Depending on your health history and family history, you may need to have cancer screening at various ages. This may include screening for:  Colorectal cancer.  Prostate cancer.  Skin cancer.  Lung cancer. What should I know about heart disease, diabetes, and high blood pressure? Blood pressure and heart disease  High blood pressure causes heart disease and increases the risk of stroke. This is more likely to develop in people who have high blood pressure readings, are of African descent, or are overweight.  Talk with your health care provider about your target blood pressure readings.  Have your blood pressure checked: ? Every 3-5 years if you are 68-41 years of age. ? Every year if you are 63 years old or older.  If you are between the ages of 78 and 71 and are a current or former smoker, ask your health care provider if you should have a one-time screening for abdominal aortic aneurysm (AAA). Diabetes Have regular diabetes screenings. This checks your fasting blood sugar level. Have the screening done:  Once every three years after age 26 if you are at a normal weight and have a low risk for diabetes.  More often and at a  younger age if you are overweight or have a high risk for diabetes. What should I know about preventing infection? Hepatitis B If you have a higher risk for hepatitis B, you should be screened for this virus. Talk with your health care provider to find out if you are at risk for hepatitis B infection. Hepatitis C Blood testing is recommended for:  Everyone born from [birth year] through 1963.  Anyone with known risk factors for  hepatitis C. Sexually transmitted infections (STIs)  You should be screened each year for STIs, including gonorrhea and chlamydia, if: ? You are sexually active and are younger than 57 years of age. ? You are older than 57 years of age and your health care provider tells you that you are at risk for this type of infection. ? Your sexual activity has changed since you were last screened, and you are at increased risk for chlamydia or gonorrhea. Ask your health care provider if you are at risk.  Ask your health care provider about whether you are at high risk for HIV. Your health care provider may recommend a prescription medicine to help prevent HIV infection. If you choose to take medicine to prevent HIV, you should first get tested for HIV. You should then be tested every 3 months for as long as you are taking the medicine. Follow these instructions at home: Lifestyle  Do not use any products that contain nicotine or tobacco, such as cigarettes, e-cigarettes, and chewing tobacco. If you need help quitting, ask your health care provider.  Do not use street drugs.  Do not share needles.  Ask your health care provider for help if you need support or information about quitting drugs. Alcohol use  Do not drink alcohol if your health care provider tells you not to drink.  If you drink alcohol: ? Limit how much you have to 0-2 drinks a day. ? Be aware of how much alcohol is in your drink. In the [place] one drink equals one 12 oz bottle of beer (355 mL), one 5 oz glass of wine (148 mL), or one 1 oz glass of hard liquor (44 mL). General instructions  Schedule regular health, dental, and eye exams.  Stay current with your vaccines.  Tell your health care provider if: ? You often feel depressed. ? You have ever been abused or do not feel safe at home. Summary  Adopting a healthy lifestyle and getting preventive care are important in promoting health and wellness.  Follow your health care  provider's instructions about healthy diet, exercising, and getting tested or screened for diseases.  Follow your health care provider's instructions on monitoring your cholesterol and blood pressure. This information is not intended to replace advice given to you by your health care provider. Make sure you discuss any questions you have with your health care provider. Document Revised: 03/08/2018 Document Reviewed: 03/08/2018 Elsevier Patient Education  2020 [employer].

## 2019-05-01 NOTE — Progress Notes (Signed)
Subjective:    Patient ID: Carlos Harrison, male    DOB: 11/28/1960, 59 y.o.   MRN: [id number]  HPI He is here for a physical exam.   He denies any major change in his health since he was here last.  He has no concerns.  He does need to have a physical for work and get his vaccinations up-to-date.  Medications and allergies reviewed with patient and updated if appropriate.  Patient Active Problem List   Diagnosis Date Noted  . Neck pain 04/26/2018  . Dilated cardiomyopathy ([place]) 04/26/2018  . Atrial flutter with rapid ventricular response ([place]) 10/26/2017  . Prediabetes 04/27/2017  . Hypertension 02/21/2017  . Hyperlipidemia 02/21/2017  . Sacroiliac joint dysfunction of right side 07/30/2014    Current Outpatient Medications on File Prior to Visit  Medication Sig Dispense Refill  . metoprolol succinate (TOPROL-XL) 50 MG 24 hr tablet Take 1 tablet (50 mg total) by mouth daily. 90 tablet 3  . rosuvastatin (CRESTOR) 10 MG tablet Take 1 tablet by mouth once daily Annual appt due in July must see provider for future refills 90 tablet 1  . sacubitril-valsartan (ENTRESTO) 49-51 MG Take 1 tablet by mouth 2 (two) times daily. 5 tablet 0  . spironolactone (ALDACTONE) 25 MG tablet TAKE 1/2 TABLET BY MOUTH EVERY DAY 45 tablet 0  . XARELTO 20 MG TABS tablet TAKE 1 TABLET (20 MG TOTAL) BY MOUTH DAILY WITH SUPPER. 90 tablet 0   No current facility-administered medications on file prior to visit.    Past Medical History:  Diagnosis Date  . Atrial fibrillation ([place]) 10/2017  . Cardiomyopathy ([place]) 11/10/2017  . Dyspnea   . Hypertension   . Palpitations 10/26/2017    Past Surgical History:  Procedure Laterality Date  . CARDIOVERSION N/A 11/16/2017   Procedure: CARDIOVERSION;  Surgeon: Acie Fredrickson Wonda Cheng, MD;  Location: [place];  Service: Cardiovascular;  Laterality: N/A;  . EYE SURGERY Left   . TEE WITH CARDIOVERSION  11/16/2017  . TEE WITHOUT CARDIOVERSION N/A 11/16/2017   Procedure: TRANSESOPHAGEAL ECHOCARDIOGRAM (TEE);  Surgeon: Acie Fredrickson Wonda Cheng, MD;  Location: [facility] ENDOSCOPY;  Service: Cardiovascular;  Laterality: N/A;    Social History   Socioeconomic History  . Marital status: Married    Spouse name: Not on file  . Number of children: 79  . Years of education: 32  . Highest education level: Not on file  Occupational History  . Occupation: [employer]  Tobacco Use  . Smoking status: Never Smoker  . Smokeless tobacco: Never Used  Substance and Sexual Activity  . Alcohol use: No  . Drug use: No  . Sexual activity: Not on file  Other Topics Concern  . Not on file  Social History Narrative   Fun: Mentoring youth, solitary games.    Denies any religious beliefs effecting health care.    Social Determinants of Health   Financial Resource Strain:   . Difficulty of Paying Living Expenses: Not on file  Food Insecurity:   . Worried About Charity fundraiser in the Last Year: Not on file  . Ran Out of Food in the Last Year: Not on file  Transportation Needs:   . Lack of Transportation (Medical): Not on file  . Lack of Transportation (Non-Medical): Not on file  Physical Activity:   . Days of Exercise per Week: Not on file  . Minutes of Exercise per Session: Not on file  Stress:   . Feeling of Stress : Not  on file  Social Connections:   . Frequency of Communication with Friends and Family: Not on file  . Frequency of Social Gatherings with Friends and Family: Not on file  . Attends Religious Services: Not on file  . Active Member of Clubs or Organizations: Not on file  . Attends Archivist Meetings: Not on file  . Marital Status: Not on file    Family History  Problem Relation Age of Onset  . Ovarian cancer Mother     Review of Systems  Constitutional: Negative for chills and fever.  Eyes: Negative for visual disturbance.  Respiratory: Negative for cough, shortness of breath and wheezing.   Cardiovascular: Positive for chest  pain (discussed with cardiology). Negative for palpitations and leg swelling.  Gastrointestinal: Negative for abdominal pain, blood in stool, constipation, diarrhea and nausea.       No gerd  Genitourinary: Negative for difficulty urinating, dysuria and hematuria.  Musculoskeletal: Positive for back pain (chronic). Negative for arthralgias.  Skin: Negative for color change and rash.  Neurological: Negative for dizziness, light-headedness, numbness and headaches.  Psychiatric/Behavioral: Negative for dysphoric mood. The patient is not nervous/anxious.        Objective:   Vitals:   05/02/19 1320  BP: (!) 152/90  Pulse: 63  Resp: 16  Temp: 98.2 F (36.8 C)  SpO2: 98%   Filed Weights   05/02/19 1320  Weight: 238 lb (108 kg)   Body mass index is 28.97 kg/m.  BP Readings from Last 3 Encounters:  05/02/19 (!) 152/90  04/19/19 (!) 150/100  10/25/18 126/84    Wt Readings from Last 3 Encounters:  05/02/19 238 lb (108 kg)  10/25/18 239 lb (108.4 kg)  07/13/18 219 lb (99.3 kg)     Physical Exam Constitutional: He appears well-developed and well-nourished. No distress.  HENT:  Head: Normocephalic and atraumatic.  Right Ear: External ear normal.  Left Ear: External ear normal.  Mouth/Throat: Oropharynx is clear and moist.  Normal ear canals and TM b/l  Eyes: Conjunctivae and EOM are normal.  Neck: Neck supple. No tracheal deviation present. No thyromegaly present.  No carotid bruit  Cardiovascular: Normal rate, regular rhythm, normal heart sounds and intact distal pulses.   No murmur heard. Pulmonary/Chest: Effort normal and breath sounds normal. No respiratory distress. He has no wheezes. He has no rales.  Abdominal: Soft. He exhibits no distension. There is no tenderness.  Genitourinary: deferred  Musculoskeletal: He exhibits no edema.  Lymphadenopathy:   He has no cervical adenopathy.  Skin: Skin is warm and dry. He is not diaphoretic.  Psychiatric: He has a normal  mood and affect. His behavior is normal.         Assessment & Plan:   Physical exam: Screening blood work  ordered Immunizations  Flu vaccine today, TB skin test placed  Colonoscopy   Never had one --  Will do it over the summer - he will let me know when to refer him Eye exams   Not up to date Exercise   Active with work Weight  Weight ok for age Substance abuse   none  See Problem List for Assessment and Plan of chronic medical problems.   This visit occurred during the SARS-CoV-2 public health emergency.  Safety protocols were in place, including screening questions prior to the visit, additional usage of staff PPE, and extensive cleaning of exam room while observing appropriate contact time as indicated for disinfecting solutions.

## 2019-05-02 ENCOUNTER — Ambulatory Visit (INDEPENDENT_AMBULATORY_CARE_PROVIDER_SITE_OTHER): Payer: 59 | Admitting: Internal Medicine

## 2019-05-02 ENCOUNTER — Encounter: Payer: Self-pay | Admitting: Internal Medicine

## 2019-05-02 ENCOUNTER — Other Ambulatory Visit: Payer: Self-pay

## 2019-05-02 VITALS — BP 152/90 | HR 63 | Temp 98.2°F | Resp 16 | Ht 76.0 in | Wt 238.0 lb

## 2019-05-02 DIAGNOSIS — E7849 Other hyperlipidemia: Secondary | ICD-10-CM

## 2019-05-02 DIAGNOSIS — I420 Dilated cardiomyopathy: Secondary | ICD-10-CM

## 2019-05-02 DIAGNOSIS — I4892 Unspecified atrial flutter: Secondary | ICD-10-CM

## 2019-05-02 DIAGNOSIS — Z0000 Encounter for general adult medical examination without abnormal findings: Secondary | ICD-10-CM

## 2019-05-02 DIAGNOSIS — R7303 Prediabetes: Secondary | ICD-10-CM | POA: Diagnosis not present

## 2019-05-02 DIAGNOSIS — Z23 Encounter for immunization: Secondary | ICD-10-CM | POA: Diagnosis not present

## 2019-05-02 DIAGNOSIS — I10 Essential (primary) hypertension: Secondary | ICD-10-CM | POA: Diagnosis not present

## 2019-05-02 LAB — COMPREHENSIVE METABOLIC PANEL
ALT: 15 U/L (ref 0–53)
AST: 24 U/L (ref 0–37)
Albumin: 4.3 g/dL (ref 3.5–5.2)
Alkaline Phosphatase: 43 U/L (ref 39–117)
BUN: 13 mg/dL (ref 6–23)
CO2: 30 mEq/L (ref 19–32)
Calcium: 9.2 mg/dL (ref 8.4–10.5)
Chloride: 105 mEq/L (ref 96–112)
Creatinine, Ser: 1.05 mg/dL (ref 0.40–1.50)
GFR: 88.19 mL/min (ref >60.00)
Glucose, Bld: 88 mg/dL (ref 70–99)
Potassium: 4.1 mEq/L (ref 3.5–5.1)
Sodium: 140 mEq/L (ref 135–145)
Total Bilirubin: 0.7 mg/dL (ref 0.2–1.2)
Total Protein: 7.3 g/dL (ref 6.0–8.3)

## 2019-05-02 LAB — CBC WITH DIFFERENTIAL/PLATELET
Basophils Absolute: 0 10*3/uL (ref 0.0–0.1)
Basophils Relative: 0.5 % (ref 0.0–3.0)
Eosinophils Absolute: 0.2 10*3/uL (ref 0.0–0.7)
Eosinophils Relative: 4.5 % (ref 0.0–5.0)
HCT: 36.4 % — ABNORMAL LOW (ref 39.0–52.0)
Hemoglobin: 12 g/dL — ABNORMAL LOW (ref 13.0–17.0)
Lymphocytes Relative: 43.3 % (ref 12.0–46.0)
Lymphs Abs: 1.5 10*3/uL (ref 0.7–4.0)
MCHC: 32.9 g/dL (ref 30.0–36.0)
MCV: 86.5 fl (ref 78.0–100.0)
Monocytes Absolute: 0.2 10*3/uL (ref 0.1–1.0)
Monocytes Relative: 7.2 % (ref 3.0–12.0)
Neutro Abs: 1.5 10*3/uL (ref 1.4–7.7)
Neutrophils Relative %: 44.5 % (ref 43.0–77.0)
Platelets: 184 10*3/uL (ref 150.0–400.0)
RBC: 4.2 Mil/uL — ABNORMAL LOW (ref 4.22–5.81)
RDW: 14.7 % (ref 11.5–15.5)
WBC: 3.4 10*3/uL — ABNORMAL LOW (ref 4.0–10.5)

## 2019-05-02 LAB — HEMOGLOBIN A1C: Hgb A1c MFr Bld: 6.2 % (ref 4.6–6.5)

## 2019-05-02 LAB — LIPID PANEL
Cholesterol: 156 mg/dL (ref 0–200)
HDL: 49.4 mg/dL (ref >39.00)
LDL Cholesterol: 93 mg/dL (ref 0–99)
NonHDL: 106.74
Total CHOL/HDL Ratio: 3
Triglycerides: 69 mg/dL (ref 0.0–149.0)
VLDL: 13.8 mg/dL (ref 0.0–40.0)

## 2019-05-02 LAB — TSH: TSH: 1.51 u[IU]/mL (ref 0.35–4.50)

## 2019-05-02 NOTE — Assessment & Plan Note (Signed)
Chronic Check a1c Low sugar / carb diet Stressed regular exercise  

## 2019-05-02 NOTE — Assessment & Plan Note (Signed)
Chronic Following with cardiology On Entresto, spironolactone, statin, beta-blocker Overall stable and controlled CBC, CMP, lipids, TSH

## 2019-05-02 NOTE — Assessment & Plan Note (Signed)
Chronic Check lipid panel  Continue daily statin Regular exercise and healthy diet encouraged  

## 2019-05-02 NOTE — Assessment & Plan Note (Signed)
Chronic Following with cardiology Underwent cardioversion Currently on Xarelto, metoprolol and doing well without symptoms Continue current medications CBC, CMP, TSH

## 2019-05-02 NOTE — Assessment & Plan Note (Signed)
Chronic Elevated slightly today, but has been better controlled Advised to start monitoring at home over the next 2 weeks and let us know if it is elevated-needs to be less than 140/90 consistently Continue current medications at current doses CMP

## 2019-05-03 LAB — PSA, TOTAL AND FREE
PSA, % Free: 25 % (calc) — ABNORMAL LOW (ref >25)
PSA, Free: 0.3 ng/mL
PSA, Total: 1.2 ng/mL (ref <=4.0)

## 2019-05-04 LAB — TB SKIN TEST
Induration: 0 mm
TB Skin Test: NEGATIVE

## 2019-05-06 ENCOUNTER — Encounter: Payer: Self-pay | Admitting: Internal Medicine

## 2019-06-15 ENCOUNTER — Other Ambulatory Visit: Payer: Self-pay | Admitting: Cardiovascular Disease

## 2019-07-18 ENCOUNTER — Other Ambulatory Visit: Payer: Self-pay | Admitting: Internal Medicine

## 2019-10-10 ENCOUNTER — Other Ambulatory Visit: Payer: Self-pay | Admitting: Internal Medicine

## 2019-10-18 ENCOUNTER — Telehealth: Payer: Self-pay

## 2019-10-18 NOTE — Telephone Encounter (Signed)
**Note De-Identified Carlos Harrison Obfuscation** The pt left his completed [employer] pt asst application for Entresto and his [employer] pt asst application for Xarelto (No documents for either) at the office.  I have completed both applications and emailed them to Dr Kyla Balzarine nurse so she can obtain his signature, date it and to fax to appropriate pt asst program's at the fax number's written on each cover letter included.Marland Kitchen

## 2019-10-23 NOTE — Telephone Encounter (Signed)
**Note De-Identified Carlos Harrison Obfuscation** Letter received from [place] stating that they have approved the pt for asst with his  Entresto. Approval is valid until 10/21/2020 Pt [id number]  The letter states that they have notified the pt of this approval as well.

## 2019-10-25 ENCOUNTER — Telehealth: Payer: Self-pay

## 2019-10-25 NOTE — Telephone Encounter (Signed)
**Note De-Identified Carlos Harrison Obfuscation** We have already taken care of this on 7/22. See phone note for details.

## 2019-10-25 NOTE — Telephone Encounter (Signed)
The pt left his Wynetta Emery and Wynetta Emery pt asst application at the office. I have completed the provider page of the application and emailed it to Dr Mariana Arn nurse so she can obtain his signature, date it and to fax all to fax number written on cover letter included.

## 2020-01-01 ENCOUNTER — Ambulatory Visit (INDEPENDENT_AMBULATORY_CARE_PROVIDER_SITE_OTHER): Payer: BC Managed Care – PPO | Admitting: Family Medicine

## 2020-01-01 ENCOUNTER — Emergency Department (HOSPITAL_BASED_OUTPATIENT_CLINIC_OR_DEPARTMENT_OTHER)
Admission: EM | Admit: 2020-01-01 | Discharge: 2020-01-02 | Disposition: A | Discharge status: Home / Self Care | Payer: BC Managed Care – PPO | Attending: Emergency Medicine | Admitting: Emergency Medicine

## 2020-01-01 ENCOUNTER — Other Ambulatory Visit: Payer: Self-pay

## 2020-01-01 ENCOUNTER — Encounter: Payer: Self-pay | Admitting: Family Medicine

## 2020-01-01 ENCOUNTER — Encounter (HOSPITAL_BASED_OUTPATIENT_CLINIC_OR_DEPARTMENT_OTHER): Payer: Self-pay | Admitting: *Deleted

## 2020-01-01 VITALS — BP 130/70 | HR 75 | Temp 98.7°F | Resp 12 | Ht 76.0 in | Wt 220.8 lb

## 2020-01-01 DIAGNOSIS — L03019 Cellulitis of unspecified finger: Secondary | ICD-10-CM

## 2020-01-01 DIAGNOSIS — M79644 Pain in right finger(s): Secondary | ICD-10-CM | POA: Diagnosis present

## 2020-01-01 DIAGNOSIS — Z7901 Long term (current) use of anticoagulants: Secondary | ICD-10-CM | POA: Diagnosis not present

## 2020-01-01 DIAGNOSIS — I10 Essential (primary) hypertension: Secondary | ICD-10-CM | POA: Diagnosis not present

## 2020-01-01 DIAGNOSIS — L03011 Cellulitis of right finger: Secondary | ICD-10-CM | POA: Diagnosis not present

## 2020-01-01 DIAGNOSIS — Z23 Encounter for immunization: Secondary | ICD-10-CM | POA: Insufficient documentation

## 2020-01-01 DIAGNOSIS — Z79899 Other long term (current) drug therapy: Secondary | ICD-10-CM | POA: Insufficient documentation

## 2020-01-01 DIAGNOSIS — S6010XA Contusion of unspecified finger with damage to nail, initial encounter: Secondary | ICD-10-CM | POA: Diagnosis not present

## 2020-01-01 DIAGNOSIS — L089 Local infection of the skin and subcutaneous tissue, unspecified: Secondary | ICD-10-CM

## 2020-01-01 MED ORDER — LIDOCAINE HCL (PF) 1 % IJ SOLN
5.0000 mL | Freq: Once | INTRAMUSCULAR | Status: AC
  Administered 2020-01-01: 5 mL
  Filled 2020-01-01: qty 5

## 2020-01-01 MED ORDER — TETANUS-DIPHTH-ACELL PERTUSSIS 5-2.5-18.5 LF-MCG/0.5 IM SUSP
0.5000 mL | Freq: Once | INTRAMUSCULAR | Status: AC
  Administered 2020-01-01: 0.5 mL via INTRAMUSCULAR
  Filled 2020-01-01: qty 0.5

## 2020-01-01 MED ORDER — AMOXICILLIN-POT CLAVULANATE 875-125 MG PO TABS
1.0000 | ORAL_TABLET | Freq: Once | ORAL | Status: AC
  Administered 2020-01-02: 1 via ORAL
  Filled 2020-01-01: qty 1

## 2020-01-01 MED ORDER — ACETAMINOPHEN 325 MG PO TABS
ORAL_TABLET | ORAL | Status: AC
  Administered 2020-01-01: 650 mg via ORAL
  Filled 2020-01-01: qty 2

## 2020-01-01 MED ORDER — ACETAMINOPHEN 325 MG PO TABS: 650.0000 mg | ORAL_TABLET | Freq: Once | ORAL | Status: AC

## 2020-01-01 MED ORDER — AMOXICILLIN-POT CLAVULANATE 875-125 MG PO TABS: 1.0000 | ORAL_TABLET | Freq: Two times a day (BID) | ORAL | 0 refills | Status: DC

## 2020-01-01 NOTE — Discharge Instructions (Addendum)
Keep dressing in place. Return for wound check on Friday. Take the antibiotic Augmentin as directed. Return for any new or worse symptoms. Elevate the right index finger and hand at all times. Keep wound dry until seen on Friday.  Take hydrocodone as needed for pain.

## 2020-01-01 NOTE — Patient Instructions (Addendum)
A few things to remember from today's visit:  Finger pain, right  Cellulitis of finger of right hand  Subungual hematoma of digit of hand, initial encounter  I am concerned about compartmental syd and I do not have the tools here to measure tissue pressure. I have sent you to the ER, where a surgical consultation stat can be done or ER provider could treat + labs/imaging can be done very fast.   Please be sure medication list is accurate. If a new problem present, please set up appointment sooner than planned today.

## 2020-01-01 NOTE — ED Triage Notes (Addendum)
Possible paronychia vs possible chemical reaction right index finger.

## 2020-01-01 NOTE — Progress Notes (Signed)
ACUTE VISIT Chief Complaint  Patient presents with  . painful hand   HPI: [name] is a 59 y.o. male with history of hypertension, hyperlipidemia, atrial flutter on chronic anticoagulation, and dilated cardiomyopathy here today with family complaining of 2 days of right index edema and severe pain. On Monday, 12/31/2019, he woke up around 3 AM with severe finger pain. No prior history.  Today she he noted some numbness and tingling on fingertip. He does not recall any history of trauma. Throbbing-like pain, constant, 10/10. Pain exacerbated by light touch and movement. No alleviating factors.  He has taken Tylenol.  Atrial flutter on Xarelto 20 mg daily. He has not noted gum/nosebleed, gross hematuria, melena, or blood in stool.  Review of Systems  Constitutional: Negative for fever.  Gastrointestinal: Negative for abdominal pain, nausea and vomiting.  Neurological: Negative for syncope, weakness and headaches.  Rest see pertinent positives and negatives per HPI.  Current Outpatient Medications on File Prior to Visit  Medication Sig Dispense Refill  . metoprolol succinate (TOPROL-XL) 50 MG 24 hr tablet Take 1 tablet (50 mg total) by mouth daily. 90 tablet 3  . rosuvastatin (CRESTOR) 10 MG tablet Take 1 tablet by mouth once daily Annual appt due in July must see provider for future refills 90 tablet 1  . sacubitril-valsartan (ENTRESTO) 49-51 MG Take 1 tablet by mouth 2 (two) times daily. 5 tablet 0  . spironolactone (ALDACTONE) 25 MG tablet TAKE 1/2 TABLET BY MOUTH EVERY DAY 45 tablet 2  . XARELTO 20 MG TABS tablet TAKE 1 TABLET (20 MG TOTAL) BY MOUTH DAILY WITH SUPPER. 90 tablet 0   No current facility-administered medications on file prior to visit.    Past Medical History:  Diagnosis Date  . Atrial fibrillation ([place]) 10/2017  . Cardiomyopathy ([place]) 11/10/2017  . Dyspnea   . Hypertension   . Palpitations 10/26/2017   No Known Allergies  Social History    Socioeconomic History  . Marital status: Married    Spouse name: Not on file  . Number of children: 70  . Years of education: 15  . Highest education level: Not on file  Occupational History  . Occupation: [employer]  Tobacco Use  . Smoking status: Never Smoker  . Smokeless tobacco: Never Used  Vaping Use  . Vaping Use: Never used  Substance and Sexual Activity  . Alcohol use: No  . Drug use: No  . Sexual activity: Not on file  Other Topics Concern  . Not on file  Social History Narrative   Fun: Mentoring youth, solitary games.    Denies any religious beliefs effecting health care.    Social Determinants of Health   Financial Resource Strain:   . Difficulty of Paying Living Expenses: Not on file  Food Insecurity:   . Worried About Charity fundraiser in the Last Year: Not on file  . Ran Out of Food in the Last Year: Not on file  Transportation Needs:   . Lack of Transportation (Medical): Not on file  . Lack of Transportation (Non-Medical): Not on file  Physical Activity:   . Days of Exercise per Week: Not on file  . Minutes of Exercise per Session: Not on file  Stress:   . Feeling of Stress : Not on file  Social Connections:   . Frequency of Communication with Friends and Family: Not on file  . Frequency of Social Gatherings with Friends and Family: Not on file  . Attends Religious  Services: Not on file  . Active Member of Clubs or Organizations: Not on file  . Attends Archivist Meetings: Not on file  . Marital Status: Not on file   Vitals:   01/01/20 1456  BP: 130/70  Pulse: 75  Resp: 12  Temp: 98.7 F (37.1 C)  SpO2: 98%   Body mass index is 26.88 kg/m.  Physical Exam Constitutional:      General: He is not in acute distress.    Appearance: He is well-developed.  HENT:     Head: Normocephalic and atraumatic.  Eyes:     Conjunctiva/sclera: Conjunctivae normal.  Cardiovascular:     Rate and Rhythm: Normal rate and regular rhythm.   Pulmonary:     Effort: Pulmonary effort is normal. No respiratory distress.     Breath sounds: Normal breath sounds.  Musculoskeletal:     Comments: Right index with decreased capillary refill, very tender with light palpation, edema and erythema. + Local heat. Subungual and distal phalange hematoma (affecting ulnar side of distal phalanx). See pictures.   Lymphadenopathy:     Cervical: No cervical adenopathy.  Skin:    General: Skin is warm.     Findings: Erythema present.     Comments: Seen pictures.  Neurological:     General: No focal deficit present.     Mental Status: He is alert and oriented to person, place, and time.     Gait: Gait normal.  Psychiatric:        Mood and Affect: Mood and affect normal.     Comments: Well groomed, good eye contact.          Comparing both index fingers. ASSESSMENT AND PLAN:  Mr. Carlos Harrison was seen today for painful hand.  Diagnoses and all orders for this visit:  Finger pain, right No known hx of trauma. I am very concerned about possibility of compartmental syndrome.  We could not arrange appointment with surgeon today, so I sent him to the ER. We discussed possible complications , including risk of tissue necrosis. Hand elevation.    He agrees with plan.  Cellulitis of finger of right hand Erythema and local heat. He may need abx treatment, given the fact he is going to be evaluated in the ER, I will let provider to decide based on evaluation.   Subungual hematoma of digit of hand, initial encounter On chronic anticoagulation. No signs of other abnormal bleeding. Some side effect of Xarelto discussed.  Pictures were taken after verbal consent.  Return if symptoms worsen or fail to improve.   Carlos Gilchrest G. [place], MD  [facility]. [place] office. A few things to remember from today's visit:  Finger pain, right  Cellulitis of finger of right hand  Subungual hematoma of digit of hand, initial  encounter  I am concerned about compartmental syd and I do not have the tools here to measure tissue pressure. I have sent you to the ER, where a surgical consultation stat can be done or ER provider could treat + labs/imaging can be done very fast.   Please be sure medication list is accurate. If a new problem present, please set up appointment sooner than planned today.

## 2020-01-02 ENCOUNTER — Telehealth: Payer: Self-pay | Admitting: Internal Medicine

## 2020-01-02 MED ORDER — HYDROCODONE-ACETAMINOPHEN 5-325 MG PO TABS: 1.0000 | ORAL_TABLET | Freq: Four times a day (QID) | ORAL | 0 refills | Status: DC | PRN

## 2020-01-02 NOTE — ED Provider Notes (Signed)
[facility] EMERGENCY DEPARTMENT Provider Note   CSN: [id number] Arrival date & time: 01/01/20  1630     History Chief Complaint  Patient presents with  . Recurrent Skin Infections    Carlos Harrison is a 59 y.o. male.  Patient complaining of pain to the right index finger. It is swollen. Mostly on the ulnar side of the finger. Patient did admit to biting his nail there on Sunday. And then also on Monday to get it exposed to some oil. Really started to swell and cause discomfort on Monday. No direct injury to the finger. Patient is on blood thinners. For atrial fibrillation. He is not diabetic. Not sure if tetanus is up-to-date.        Past Medical History:  Diagnosis Date  . Atrial fibrillation ([place]) 10/2017  . Cardiomyopathy ([place]) 11/10/2017  . Dyspnea   . Hypertension   . Palpitations 10/26/2017    Patient Active Problem List   Diagnosis Date Noted  . Neck pain 04/26/2018  . Dilated cardiomyopathy ([place]) 04/26/2018  . Atrial flutter, paroxysmal ([place]) 10/26/2017  . Prediabetes 04/27/2017  . Hypertension 02/21/2017  . Hyperlipidemia 02/21/2017  . Sacroiliac joint dysfunction of right side 07/30/2014    Past Surgical History:  Procedure Laterality Date  . CARDIOVERSION N/A 11/16/2017   Procedure: CARDIOVERSION;  Surgeon: Acie Fredrickson Wonda Cheng, MD;  Location: [facility];  Service: Cardiovascular;  Laterality: N/A;  . EYE SURGERY Left   . TEE WITH CARDIOVERSION  11/16/2017  . TEE WITHOUT CARDIOVERSION N/A 11/16/2017   Procedure: TRANSESOPHAGEAL ECHOCARDIOGRAM (TEE);  Surgeon: Acie Fredrickson Wonda Cheng, MD;  Location: [facility] END[facility]COPY;  Service: Cardiovascular;  Laterality: N/A;       Family History  Problem Relation Age of Onset  . Ovarian cancer Mother     Social History   Tobacco Use  . Smoking status: Never Smoker  . Smokeless tobacco: Never Used  Vaping Use  . Vaping Use: Never used  Substance Use Topics  . Alcohol use: No  . Drug use: No    Home  Medications Prior to Admission medications   Medication Sig Start Date End Date Taking? Authorizing Provider  amoxicillin-clavulanate (AUGMENTIN) 875-125 MG tablet Take 1 tablet by mouth every 12 (twelve) hours. 01/01/20   Fredia Sorrow, MD  HYDROcodone-acetaminophen (NORCO/VICODIN) 5-325 MG tablet Take 1-2 tablets by mouth every 6 (six) hours as needed for moderate pain. 01/02/20   Fredia Sorrow, MD  metoprolol succinate (TOPROL-XL) 50 MG 24 hr tablet Take 1 tablet (50 mg total) by mouth daily. 04/19/19   Josue Hector, MD  rosuvastatin (CRESTOR) 10 MG tablet Take 1 tablet by mouth once daily Annual appt due in July must see provider for future refills 04/30/19   [name], MD  sacubitril-valsartan (ENTRESTO) 49-51 MG Take 1 tablet by mouth 2 (two) times daily. 01/29/19   Josue Hector, MD  spironolactone (ALDACTONE) 25 MG tablet TAKE 1/2 TABLET BY MOUTH EVERY DAY 06/15/19   Josue Hector, MD  XARELTO 20 MG TABS tablet TAKE 1 TABLET (20 MG TOTAL) BY MOUTH DAILY WITH SUPPER. 10/10/19   Burns, Claudina Lick, MD    Allergies    Patient has no known allergies.  Review of Systems   Review of Systems  Constitutional: Positive for fever. Negative for chills.  HENT: Negative for rhinorrhea and sore throat.   Eyes: Negative for visual disturbance.  Respiratory: Negative for cough and shortness of breath.   Cardiovascular: Negative for chest pain and leg  swelling.  Gastrointestinal: Negative for abdominal pain, diarrhea, nausea and vomiting.  Genitourinary: Negative for dysuria.  Musculoskeletal: Negative for back pain and neck pain.  Skin: Negative for rash and wound.  Neurological: Negative for dizziness, light-headedness and headaches.  Hematological: Does not bruise/bleed easily.  Psychiatric/Behavioral: Negative for confusion.    Physical Exam Updated Vital Signs BP 123/80   Pulse 63   Temp (!) 100.7 F (38.2 C) (Oral)   Resp 18   Ht 1.93 m (6\' 4" )   Wt 99.8 kg   SpO2 100%    BMI 26.78 kg/m   Physical Exam Vitals and nursing note reviewed.  Constitutional:      General: He is not in acute distress.    Appearance: He is well-developed.  HENT:     Head: Normocephalic and atraumatic.  Eyes:     Extraocular Movements: Extraocular movements intact.     Conjunctiva/sclera: Conjunctivae normal.     Pupils: Pupils are equal, round, and reactive to light.  Cardiovascular:     Rate and Rhythm: Normal rate and regular rhythm.     Heart sounds: No murmur heard.   Pulmonary:     Effort: Pulmonary effort is normal. No respiratory distress.     Breath sounds: Normal breath sounds.  Abdominal:     Palpations: Abdomen is soft.     Tenderness: There is no abdominal tenderness.  Musculoskeletal:        General: Swelling present.     Cervical back: Neck supple.     Comments: Swelling to the right index finger. Fluctuance along the ulnar aspect of the index finger along the nailbed and then kind of back to the PIP joint. Some erythema that extends to the dorsum of the hand but no tenderness to the palmar surface or over the dorsum. Also some swelling there. Range of motion without significant pain but the finger is very tight and stiff. Cap refill intact. Sensation intact. No purulent discharge.  Skin:    General: Skin is warm and dry.     Capillary Refill: Capillary refill takes less than 2 seconds.  Neurological:     General: No focal deficit present.     Mental Status: He is alert and oriented to person, place, and time.     Cranial Nerves: No cranial nerve deficit.     Sensory: No sensory deficit.     Coordination: Coordination abnormal.     ED Results / Procedures / Treatments   Labs (all labs ordered are listed, but only abnormal results are displayed) Labs Reviewed - No data to display  EKG None  Radiology No results found.  Procedures .Marland KitchenIncision and Drainage  Date/Time: 01/02/2020 12:08 AM Performed by: Fredia Sorrow, MD Authorized by:  Fredia Sorrow, MD   Consent:    Consent obtained:  Verbal   Consent given by:  Patient   Risks discussed:  Bleeding, incomplete drainage and pain   Alternatives discussed:  No treatment and observation Location:    Type:  Abscess   Location:  Upper extremity   Upper extremity location:  Finger   Finger location:  R index finger Pre-procedure details:    Skin preparation:  Betadine Anesthesia (see MAR for exact dosages):    Anesthesia method:  Nerve block   Block needle gauge:  25 G   Block anesthetic:  Lidocaine 1% w/o epi   Block technique:  Digital block.   Block injection procedure:  Anatomic landmarks identified, introduced needle, negative aspiration for blood and  incremental injection   Block outcome:  Anesthesia achieved Procedure type:    Complexity:  Complex Procedure details:    Needle aspiration: yes     Needle size:  18 G   Incision types:  Single straight   Incision depth:  Subcutaneous   Scalpel blade:  11   Wound management:  Probed and deloculated, irrigated with saline and extensive cleaning   Drainage:  Purulent and bloody   Drainage amount:  Moderate   Wound treatment:  Wound left open   Packing materials:  None Post-procedure details:    Patient tolerance of procedure:  Tolerated well, no immediate complications   (including critical care time)  Medications Ordered in ED Medications  amoxicillin-clavulanate (AUGMENTIN) 875-125 MG per tablet 1 tablet (has no administration in time range)  lidocaine (PF) (XYLOCAINE) 1 % injection 5 mL (5 mLs Infiltration Given 01/01/20 2137)  acetaminophen (TYLENOL) tablet 650 mg (650 mg Oral Given 01/01/20 2247)  Tdap (BOOSTRIX) injection 0.5 mL (0.5 mLs Intramuscular Given 01/01/20 2338)    ED Course  I have reviewed the triage vital signs and the nursing notes.  Pertinent labs & imaging results that were available during my care of the patient were reviewed by me and considered in my medical decision making (see  chart for details).    MDM Rules/Calculators/A&P                          Patient after nerve block went ahead and needle aspirated the area of fluctuance on the ulnar side of the index finger. And got purulent discharge. Then took 11 blade and made a single straight incision along the ulnar aspect of the digit starting distally and moving proximally. Expressed the rest of the pus out. Then irrigated extensively. Patient tolerated the procedure extremely well. Patient given Augmentin here because of the fact that it seemed to be related to him biting the nail in that area. He will continue Augmentin for the next 7 days. Given hydrocodone for pain. Will keep a dressing in place that was dressed with antibiotic ointment until seen for wound check on Friday. He will return earlier for any new or worse symptoms. Patient's tetanus updated here.     Final Clinical Impression(s) / ED Diagnoses Final diagnoses:  Finger infection  Paronychia of index finger    Rx / DC Orders ED Discharge Orders         Ordered    HYDROcodone-acetaminophen (NORCO/VICODIN) 5-325 MG tablet  Every 6 hours PRN        01/02/20 0001    amoxicillin-clavulanate (AUGMENTIN) 875-125 MG tablet  Every 12 hours        01/01/20 2356           Fredia Sorrow, MD 01/02/20 [phone]

## 2020-01-02 NOTE — Telephone Encounter (Signed)
    Patient requesting return to work note for 10/8. Patient states ED wrote note stating return on 10/13, but he is able to return sooner. Patient can pick up note

## 2020-01-02 NOTE — Telephone Encounter (Signed)
Left message for patient that letter was up front for pick up.

## 2020-01-02 NOTE — Telephone Encounter (Signed)
printed

## 2020-01-04 ENCOUNTER — Other Ambulatory Visit: Payer: Self-pay

## 2020-01-04 ENCOUNTER — Encounter (HOSPITAL_BASED_OUTPATIENT_CLINIC_OR_DEPARTMENT_OTHER): Payer: Self-pay | Admitting: *Deleted

## 2020-01-04 ENCOUNTER — Emergency Department (HOSPITAL_BASED_OUTPATIENT_CLINIC_OR_DEPARTMENT_OTHER)
Admission: EM | Admit: 2020-01-04 | Discharge: 2020-01-04 | Disposition: A | Discharge status: Home / Self Care | Payer: BC Managed Care – PPO | Attending: Emergency Medicine | Admitting: Emergency Medicine

## 2020-01-04 DIAGNOSIS — I10 Essential (primary) hypertension: Secondary | ICD-10-CM | POA: Insufficient documentation

## 2020-01-04 DIAGNOSIS — Z5189 Encounter for other specified aftercare: Secondary | ICD-10-CM

## 2020-01-04 DIAGNOSIS — Z79899 Other long term (current) drug therapy: Secondary | ICD-10-CM | POA: Insufficient documentation

## 2020-01-04 DIAGNOSIS — Z4800 Encounter for change or removal of nonsurgical wound dressing: Secondary | ICD-10-CM | POA: Insufficient documentation

## 2020-01-04 DIAGNOSIS — L03011 Cellulitis of right finger: Secondary | ICD-10-CM | POA: Diagnosis not present

## 2020-01-04 DIAGNOSIS — T8149XA Infection following a procedure, other surgical site, initial encounter: Secondary | ICD-10-CM | POA: Insufficient documentation

## 2020-01-04 NOTE — ED Provider Notes (Signed)
[facility] EMERGENCY DEPARTMENT Provider Note   CSN: [id number] Arrival date & time: 01/04/20  1331     History Chief Complaint  Patient presents with  . Wound Check    TARICK PARENTEAU is a 59 y.o. male.  HPI Patient is a 59 year old male with a medical history as noted below.  Patient was seen 2 days ago for a paronychia of the right index finger.  This was incised and patient was placed on Augmentin which he states he has been compliant with.  Patient presents today for wound check.  He reports mild increased warmth and swelling in the right hand which has been gradually improving for the past 2 days.  No other physical complaints at this time.    Past Medical History:  Diagnosis Date  . Atrial fibrillation ([place]) 10/2017  . Cardiomyopathy ([place]) 11/10/2017  . Dyspnea   . Hypertension   . Palpitations 10/26/2017    Patient Active Problem List   Diagnosis Date Noted  . Neck pain 04/26/2018  . Dilated cardiomyopathy ([place]) 04/26/2018  . Atrial flutter, paroxysmal ([place]) 10/26/2017  . Prediabetes 04/27/2017  . Hypertension 02/21/2017  . Hyperlipidemia 02/21/2017  . Sacroiliac joint dysfunction of right side 07/30/2014    Past Surgical History:  Procedure Laterality Date  . CARDIOVERSION N/A 11/16/2017   Procedure: CARDIOVERSION;  Surgeon: Acie Fredrickson Wonda Cheng, MD;  Location: [facility];  Service: Cardiovascular;  Laterality: N/A;  . EYE SURGERY Left   . TEE WITH CARDIOVERSION  11/16/2017  . TEE WITHOUT CARDIOVERSION N/A 11/16/2017   Procedure: TRANSESOPHAGEAL ECHOCARDIOGRAM (TEE);  Surgeon: Acie Fredrickson Wonda Cheng, MD;  Location[facility] ENDOSCOPY;  Service: Cardiovascular;  Laterality: N/A;       Family History  Problem Relation Age of Onset  . Ovarian cancer Mother     Social History   Tobacco Use  . Smoking status: Never Smoker  . Smokeless tobacco: Never Used  Vaping Use  . Vaping Use: Never used  Substance Use Topics  . Alcohol use: No  . Drug use: No     Home Medications Prior to Admission medications   Medication Sig Start Date End Date Taking? Authorizing Provider  amoxicillin-clavulanate (AUGMENTIN) 875-125 MG tablet Take 1 tablet by mouth every 12 (twelve) hours. 01/01/20   Fredia Sorrow, MD  HYDROcodone-acetaminophen (NORCO/VICODIN) 5-325 MG tablet Take 1-2 tablets by mouth every 6 (six) hours as needed for moderate pain. 01/02/20   Fredia Sorrow, MD  metoprolol succinate (TOPROL-XL) 50 MG 24 hr tablet Take 1 tablet (50 mg total) by mouth daily. 04/19/19   Josue Hector, MD  rosuvastatin (CRESTOR) 10 MG tablet Take 1 tablet by mouth once daily Annual appt due in July must see provider for future refills 04/30/19   [name], MD  sacubitril-valsartan (ENTRESTO) 49-51 MG Take 1 tablet by mouth 2 (two) times daily. 01/29/19   Josue Hector, MD  spironolactone (ALDACTONE) 25 MG tablet TAKE 1/2 TABLET BY MOUTH EVERY DAY 06/15/19   Josue Hector, MD  XARELTO 20 MG TABS tablet TAKE 1 TABLET (20 MG TOTAL) BY MOUTH DAILY WITH SUPPER. 10/10/19   Burns, Claudina Lick, MD    Allergies    Patient has no known allergies.  Review of Systems   Review of Systems  Constitutional: Negative for chills and fever.  Gastrointestinal: Negative for nausea and vomiting.  Skin: Positive for wound.   Physical Exam Updated Vital Signs BP 115/82   Pulse 67   Temp 98.2 F (36.8  C) (Oral)   Resp 16   Ht 6\' 4"  (1.93 m)   Wt 99.8 kg   SpO2 98%   BMI 26.78 kg/m   Physical Exam Vitals and nursing note reviewed.  Constitutional:      General: He is not in acute distress.    Appearance: He is well-developed.  HENT:     Head: Normocephalic and atraumatic.     Right Ear: External ear normal.     Left Ear: External ear normal.  Eyes:     General: No scleral icterus.       Right eye: No discharge.        Left eye: No discharge.     Conjunctiva/sclera: Conjunctivae normal.  Neck:     Trachea: No tracheal deviation.  Cardiovascular:     Rate and  Rhythm: Normal rate.  Pulmonary:     Effort: Pulmonary effort is normal. No respiratory distress.     Breath sounds: No stridor.  Abdominal:     General: There is no distension.  Musculoskeletal:        General: No swelling or deformity.     Cervical back: Neck supple.  Skin:    General: Skin is warm and dry.     Findings: Erythema and wound present. No rash.     Comments: Linear incision noted to the ulnar aspect of the right index finger.  Small amount of discharge noted at the site.  Small amount of surrounding cellulitis.  Distal sensation intact.  Palpable pain overlying and surrounding the site.  Full range of motion of the finger.  Trace edema noted diffusely in the right hand.  Full range of motion of the fingers of the right hand.  2+ radial pulses.  Neurological:     General: No focal deficit present.     Mental Status: He is alert and oriented to person, place, and time.     Cranial Nerves: Cranial nerve deficit: no gross deficits.  Psychiatric:        Mood and Affect: Mood normal.        Behavior: Behavior normal.    ED Results / Procedures / Treatments   Labs (all labs ordered are listed, but only abnormal results are displayed) Labs Reviewed - No data to display  EKG None  Radiology No results found.  Procedures Procedures (including critical care time)  Medications Ordered in ED Medications - No data to display  ED Course  I have reviewed the triage vital signs and the nursing notes.  Pertinent labs & imaging results that were available during my care of the patient were reviewed by me and considered in my medical decision making (see chart for details).    MDM Rules/Calculators/A&P                          Patient is a 59 year old male who presents today for wound check.  He states he has been compliant with his Augmentin.  Wound appears to be healing well compared to prior photos from 2 days ago. Reduced edema and erythema at the site.  Small amount  of discharge noted on my exam.  Recommended patient discontinue wrapping the finger and allow it to drain.  Discussed proper wound care.  Recommended that he complete his full course of Augmentin.  He is going to follow-up with his PCP next week for an additional wound check.  He understands he needs to return to the ER over  the weekend if he develops new or worsening symptoms.  His questions were answered and he was amicable at the time of discharge.  His vital signs are stable.  He is afebrile, not tachycardic, not hypoxic.  Note: Portions of this report may have been transcribed using voice recognition software. Every effort was made to ensure accuracy; however, inadvertent computerized transcription errors may be present.   Final Clinical Impression(s) / ED Diagnoses Final diagnoses:  Visit for wound check   Rx / DC Orders ED Discharge Orders    None       Rayna Sexton, [name] 01/04/20 1446    Tegeler, Gwenyth Allegra, MD 01/04/20 1521

## 2020-01-04 NOTE — ED Notes (Signed)
Open skin area to right index finger.

## 2020-01-04 NOTE — ED Triage Notes (Signed)
Here for wound recheck right finger infection

## 2020-01-04 NOTE — Discharge Instructions (Addendum)
Please return to the ER over the weekend if you develop any new or worsening symptoms.  Please make sure that you continue taking your antibiotic and do not stop taking this early.  Please follow-up with your regular doctor next week for an additional wound check.  It was a pleasure to meet you.

## 2020-01-04 NOTE — ED Notes (Signed)
ED Provider at bedside. 

## 2020-01-10 ENCOUNTER — Other Ambulatory Visit: Payer: Self-pay | Admitting: Internal Medicine

## 2020-01-17 DIAGNOSIS — L03011 Cellulitis of right finger: Secondary | ICD-10-CM | POA: Insufficient documentation

## 2020-01-17 NOTE — Progress Notes (Deleted)
Subjective:    Patient ID: Carlos Harrison, male    DOB: 11-15-1960, 59 y.o.   MRN: [id number]  HPI The patient is here for follow up of his finger injury.    ED 10/5 for R index finger injury.  He bit his finger and oil got in the wound the next day.  It was infected.  He received a tdap and it was I& D'd.  He was placed on augmentin.    Ed 10/8 for wound check.  Infection was improving.  No change in treatment.   Here today for follow up.  Completed Augmentin.      Medications and allergies reviewed with patient and updated if appropriate.  Patient Active Problem List   Diagnosis Date Noted  . Neck pain 04/26/2018  . Dilated cardiomyopathy ([place]) 04/26/2018  . Atrial flutter, paroxysmal ([place]) 10/26/2017  . Prediabetes 04/27/2017  . Hypertension 02/21/2017  . Hyperlipidemia 02/21/2017  . Sacroiliac joint dysfunction of right side 07/30/2014    Current Outpatient Medications on File Prior to Visit  Medication Sig Dispense Refill  . amoxicillin-clavulanate (AUGMENTIN) 875-125 MG tablet Take 1 tablet by mouth every 12 (twelve) hours. 14 tablet 0  . HYDROcodone-acetaminophen (NORCO/VICODIN) 5-325 MG tablet Take 1-2 tablets by mouth every 6 (six) hours as needed for moderate pain. 12 tablet 0  . metoprolol succinate (TOPROL-XL) 50 MG 24 hr tablet Take 1 tablet (50 mg total) by mouth daily. 90 tablet 3  . rosuvastatin (CRESTOR) 10 MG tablet Take 1 tablet by mouth once daily Annual appt due in July must see provider for future refills 90 tablet 1  . sacubitril-valsartan (ENTRESTO) 49-51 MG Take 1 tablet by mouth 2 (two) times daily. 5 tablet 0  . spironolactone (ALDACTONE) 25 MG tablet TAKE 1/2 TABLET BY MOUTH EVERY DAY 45 tablet 2  . XARELTO 20 MG TABS tablet TAKE 1 TABLET (20 MG TOTAL) BY MOUTH DAILY WITH SUPPER. 90 tablet 0   No current facility-administered medications on file prior to visit.    Past Medical History:  Diagnosis Date  . Atrial fibrillation ([place]) 10/2017  .  Cardiomyopathy ([place]) 11/10/2017  . Dyspnea   . Hypertension   . Palpitations 10/26/2017    Past Surgical History:  Procedure Laterality Date  . CARDIOVERSION N/A 11/16/2017   Procedure: CARDIOVERSION;  Surgeon: Acie Fredrickson Wonda Cheng, MD;  Location: [place];  Service: Cardiovascular;  Laterality: N/A;  . EYE SURGERY Left   . TEE WITH CARDIOVERSION  11/16/2017  . TEE WITHOUT CARDIOVERSION N/A 11/16/2017   Procedure: TRANSESOPHAGEAL ECHOCARDIOGRAM (TEE);  Surgeon: Acie Fredrickson Wonda Cheng, MD;  Location: [facility] ENDOSCOPY;  Service: Cardiovascular;  Laterality: N/A;    Social History   Socioeconomic History  . Marital status: Married    Spouse name: Not on file  . Number of children: 58  . Years of education: 28  . Highest education level: Not on file  Occupational History  . Occupation: [employer]  Tobacco Use  . Smoking status: Never Smoker  . Smokeless tobacco: Never Used  Vaping Use  . Vaping Use: Never used  Substance and Sexual Activity  . Alcohol use: No  . Drug use: No  . Sexual activity: Not on file  Other Topics Concern  . Not on file  Social History Narrative   Fun: Mentoring youth, solitary games.    Denies any religious beliefs effecting health care.    Social Determinants of Health   Financial Resource Strain:   . Difficulty of  Paying Living Expenses: Not on file  Food Insecurity:   . Worried About Charity fundraiser in the Last Year: Not on file  . Ran Out of Food in the Last Year: Not on file  Transportation Needs:   . Lack of Transportation (Medical): Not on file  . Lack of Transportation (Non-Medical): Not on file  Physical Activity:   . Days of Exercise per Week: Not on file  . Minutes of Exercise per Session: Not on file  Stress:   . Feeling of Stress : Not on file  Social Connections:   . Frequency of Communication with Friends and Family: Not on file  . Frequency of Social Gatherings with Friends and Family: Not on file  . Attends Religious Services:  Not on file  . Active Member of Clubs or Organizations: Not on file  . Attends Archivist Meetings: Not on file  . Marital Status: Not on file    Family History  Problem Relation Age of Onset  . Ovarian cancer Mother     Review of Systems     Objective:  There were no vitals filed for this visit. BP Readings from Last 3 Encounters:  01/04/20 115/82  01/02/20 125/75  01/01/20 130/70   Wt Readings from Last 3 Encounters:  01/04/20 220 lb (99.8 kg)  01/01/20 220 lb (99.8 kg)  01/01/20 220 lb 12.8 oz (100.2 kg)   There is no height or weight on file to calculate BMI.   Physical Exam    Constitutional: Appears well-developed and well-nourished. No distress.  HENT:  Head: Normocephalic and atraumatic.  Neck: Neck supple. No tracheal deviation present. No thyromegaly present.  No cervical lymphadenopathy Cardiovascular: Normal rate, regular rhythm and normal heart sounds.   No murmur heard. No carotid bruit .  No edema Pulmonary/Chest: Effort normal and breath sounds normal. No respiratory distress. No has no wheezes. No rales.  Skin: Skin is warm and dry. Not diaphoretic.  Psychiatric: Normal mood and affect. Behavior is normal.      Assessment & Plan:    See Problem List for Assessment and Plan of chronic medical problems.    This visit occurred during the SARS-CoV-2 public health emergency.  Safety protocols were in place, including screening questions prior to the visit, additional usage of staff PPE, and extensive cleaning of exam room while observing appropriate contact time as indicated for disinfecting solutions.

## 2020-01-18 ENCOUNTER — Ambulatory Visit: Payer: BC Managed Care – PPO | Admitting: Internal Medicine

## 2020-01-18 DIAGNOSIS — L03011 Cellulitis of right finger: Secondary | ICD-10-CM

## 2020-01-20 NOTE — Patient Instructions (Signed)
  Blood work was ordered.     Medications reviewed and updated.  Changes include :     Your prescription(s) have been submitted to your pharmacy. Please take as directed and contact our office if you believe you are having problem(s) with the medication(s).  A referral was ordered for        Someone from their office will call you to schedule an appointment.    Please followup in 6 months   

## 2020-01-20 NOTE — Progress Notes (Signed)
Subjective:    Patient ID: Carlos Harrison, male    DOB: 10/18/1960, 59 y.o.   MRN: [id number]  HPI The patient is here for follow up of his finger injury.  He is also here for follow up of his chronic medical problems.     ED 10/5 for R index finger injury.  He bit his finger and oil got in the wound the next day.  It was infected.  He received a tdap and it was I& D'd.  He was placed on augmentin.    Ed 10/8 for wound check.  Infection was improving.  No change in treatment.   Here today for follow up.  Completed Augmentin.      He is taking all of his medications as prescribed.      Medications and allergies reviewed with patient and updated if appropriate.  Patient Active Problem List   Diagnosis Date Noted  . Paronychia of right index finger 01/17/2020  . Neck pain 04/26/2018  . Dilated cardiomyopathy ([place]) 04/26/2018  . Atrial flutter, paroxysmal ([place]) 10/26/2017  . Prediabetes 04/27/2017  . Hypertension 02/21/2017  . Hyperlipidemia 02/21/2017  . Sacroiliac joint dysfunction of right side 07/30/2014    Current Outpatient Medications on File Prior to Visit  Medication Sig Dispense Refill  . metoprolol succinate (TOPROL-XL) 50 MG 24 hr tablet Take 1 tablet (50 mg total) by mouth daily. 90 tablet 3  . rosuvastatin (CRESTOR) 10 MG tablet Take 1 tablet by mouth once daily Annual appt due in July must see provider for future refills 90 tablet 1  . sacubitril-valsartan (ENTRESTO) 49-51 MG Take 1 tablet by mouth 2 (two) times daily. 5 tablet 0  . spironolactone (ALDACTONE) 25 MG tablet TAKE 1/2 TABLET BY MOUTH EVERY DAY 45 tablet 2  . XARELTO 20 MG TABS tablet TAKE 1 TABLET (20 MG TOTAL) BY MOUTH DAILY WITH SUPPER. 90 tablet 0   No current facility-administered medications on file prior to visit.    Past Medical History:  Diagnosis Date  . Atrial fibrillation ([place]) 10/2017  . Cardiomyopathy ([place]) 11/10/2017  . Dyspnea   . Hypertension   . Palpitations 10/26/2017     Past Surgical History:  Procedure Laterality Date  . CARDIOVERSION N/A 11/16/2017   Procedure: CARDIOVERSION;  Surgeon: Acie Fredrickson Wonda Cheng, MD;  Location: [place];  Service: Cardiovascular;  Laterality: N/A;  . EYE SURGERY Left   . TEE WITH CARDIOVERSION  11/16/2017  . TEE WITHOUT CARDIOVERSION N/A 11/16/2017   Procedure: TRANSESOPHAGEAL ECHOCARDIOGRAM (TEE);  Surgeon: Acie Fredrickson Wonda Cheng, MD;  Location: [facility] ENDOSCOPY;  Service: Cardiovascular;  Laterality: N/A;    Social History   Socioeconomic History  . Marital status: Married    Spouse name: Not on file  . Number of children: 72  . Years of education: 72  . Highest education level: Not on file  Occupational History  . Occupation: [employer]  Tobacco Use  . Smoking status: Never Smoker  . Smokeless tobacco: Never Used  Vaping Use  . Vaping Use: Never used  Substance and Sexual Activity  . Alcohol use: No  . Drug use: No  . Sexual activity: Not on file  Other Topics Concern  . Not on file  Social History Narrative   Fun: Mentoring youth, solitary games.    Denies any religious beliefs effecting health care.    Social Determinants of Health   Financial Resource Strain:   . Difficulty of Paying Living Expenses: Not on file  Food Insecurity:   . Worried About Charity fundraiser in the Last Year: Not on file  . Ran Out of Food in the Last Year: Not on file  Transportation Needs:   . Lack of Transportation (Medical): Not on file  . Lack of Transportation (Non-Medical): Not on file  Physical Activity:   . Days of Exercise per Week: Not on file  . Minutes of Exercise per Session: Not on file  Stress:   . Feeling of Stress : Not on file  Social Connections:   . Frequency of Communication with Friends and Family: Not on file  . Frequency of Social Gatherings with Friends and Family: Not on file  . Attends Religious Services: Not on file  . Active Member of Clubs or Organizations: Not on file  . Attends Theatre manager Meetings: Not on file  . Marital Status: Not on file    Family History  Problem Relation Age of Onset  . Ovarian cancer Mother     Review of Systems     Objective:  There were no vitals filed for this visit. BP Readings from Last 3 Encounters:  01/04/20 115/82  01/02/20 125/75  01/01/20 130/70   Wt Readings from Last 3 Encounters:  01/04/20 220 lb (99.8 kg)  01/01/20 220 lb (99.8 kg)  01/01/20 220 lb 12.8 oz (100.2 kg)   There is no height or weight on file to calculate BMI.   Physical Exam    Constitutional: Appears well-developed and well-nourished. No distress.  HENT:  Head: Normocephalic and atraumatic.  Neck: Neck supple. No tracheal deviation present. No thyromegaly present.  No cervical lymphadenopathy Cardiovascular: Normal rate, regular rhythm and normal heart sounds.   No murmur heard. No carotid bruit .  No edema Pulmonary/Chest: Effort normal and breath sounds normal. No respiratory distress. No has no wheezes. No rales.  Skin: Skin is warm and dry. Not diaphoretic.  Psychiatric: Normal mood and affect. Behavior is normal.      Assessment & Plan:    See Problem List for Assessment and Plan of chronic medical problems.    This visit occurred during the SARS-CoV-2 public health emergency.  Safety protocols were in place, including screening questions prior to the visit, additional usage of staff PPE, and extensive cleaning of exam room while observing appropriate contact time as indicated for disinfecting solutions.    This encounter was created in error - please disregard.

## 2020-01-21 ENCOUNTER — Encounter: Payer: BC Managed Care – PPO | Admitting: Internal Medicine

## 2020-01-21 DIAGNOSIS — I420 Dilated cardiomyopathy: Secondary | ICD-10-CM

## 2020-01-21 DIAGNOSIS — I10 Essential (primary) hypertension: Secondary | ICD-10-CM

## 2020-01-21 DIAGNOSIS — E7849 Other hyperlipidemia: Secondary | ICD-10-CM

## 2020-01-21 DIAGNOSIS — I4892 Unspecified atrial flutter: Secondary | ICD-10-CM

## 2020-01-21 DIAGNOSIS — R7303 Prediabetes: Secondary | ICD-10-CM

## 2020-01-21 DIAGNOSIS — L03011 Cellulitis of right finger: Secondary | ICD-10-CM

## 2020-01-28 ENCOUNTER — Ambulatory Visit: Payer: BC Managed Care – PPO | Admitting: Internal Medicine

## 2020-01-30 ENCOUNTER — Encounter: Payer: Self-pay | Admitting: Internal Medicine

## 2020-01-30 ENCOUNTER — Other Ambulatory Visit: Payer: Self-pay

## 2020-01-30 ENCOUNTER — Ambulatory Visit (INDEPENDENT_AMBULATORY_CARE_PROVIDER_SITE_OTHER): Payer: BC Managed Care – PPO | Admitting: Internal Medicine

## 2020-01-30 DIAGNOSIS — L03011 Cellulitis of right finger: Secondary | ICD-10-CM | POA: Diagnosis not present

## 2020-01-30 MED ORDER — ROSUVASTATIN CALCIUM 10 MG PO TABS: ORAL_TABLET | ORAL | 1 refills | Status: DC

## 2020-01-30 MED ORDER — MUPIROCIN CALCIUM 2 % EX CREA: 1.0000 "application " | TOPICAL_CREAM | Freq: Two times a day (BID) | CUTANEOUS | 0 refills | Status: DC

## 2020-01-30 NOTE — Assessment & Plan Note (Signed)
Subacute Drained 1 month ago Completed antibiotics Overall his finger is healing and without infection.  He does have some peeling skin and still has a lot of healing to do.  His fingernail recently came off so he is areas of rawness Discussed the importance of monitoring for an infection and coming in if he thinks he does have a We will prescribe Bactroban cream to use twice daily-currently he was using Neosporin Correction to keep it covered at work-he has been careful, but is not currently keeping it covered

## 2020-01-30 NOTE — Progress Notes (Signed)
Subjective:    Patient ID: Carlos Harrison, male    DOB: 01-22-1961, 59 y.o.   MRN: [id number]  HPI The patient is here for follow up of his finger injury.  He is also here for follow up of his chronic medical problems.     ED 10/5 for R index finger injury.  He bit his finger and oil got in the wound the next day.  It was infected.  He received a tdap and it was I& D'd.  He was placed on augmentin.    Ed 10/8 for wound check.  Infection was improving.  No change in treatment.  He completed the Augmentin.  Here today for follow up.  He states the finger nail fell off a couple of days ago.  The skin at the end of the finger is peeling.    His finger feels good.  No N/T.  He denies swelling.  He is not painful to touch.   If he hits it there will be pain.  He denies drainage.        Medications and allergies reviewed with patient and updated if appropriate.  Patient Active Problem List   Diagnosis Date Noted  . Paronychia of right index finger 01/17/2020  . Neck pain 04/26/2018  . Dilated cardiomyopathy ([place]) 04/26/2018  . Atrial flutter, paroxysmal ([place]) 10/26/2017  . Prediabetes 04/27/2017  . Hypertension 02/21/2017  . Hyperlipidemia 02/21/2017  . Sacroiliac joint dysfunction of right side 07/30/2014    Current Outpatient Medications on File Prior to Visit  Medication Sig Dispense Refill  . metoprolol succinate (TOPROL-XL) 50 MG 24 hr tablet Take 1 tablet (50 mg total) by mouth daily. 90 tablet 3  . sacubitril-valsartan (ENTRESTO) 49-51 MG Take 1 tablet by mouth 2 (two) times daily. 5 tablet 0  . spironolactone (ALDACTONE) 25 MG tablet TAKE 1/2 TABLET BY MOUTH EVERY DAY 45 tablet 2  . XARELTO 20 MG TABS tablet TAKE 1 TABLET (20 MG TOTAL) BY MOUTH DAILY WITH SUPPER. 90 tablet 0   No current facility-administered medications on file prior to visit.    Past Medical History:  Diagnosis Date  . Atrial fibrillation ([place]) 10/2017  . Cardiomyopathy ([place]) 11/10/2017  .  Dyspnea   . Hypertension   . Palpitations 10/26/2017    Past Surgical History:  Procedure Laterality Date  . CARDIOVERSION N/A 11/16/2017   Procedure: CARDIOVERSION;  Surgeon: Acie Fredrickson Wonda Cheng, MD;  Location: [place];  Service: Cardiovascular;  Laterality: N/A;  . EYE SURGERY Left   . TEE WITH CARDIOVERSION  11/16/2017  . TEE WITHOUT CARDIOVERSION N/A 11/16/2017   Procedure: TRANSESOPHAGEAL ECHOCARDIOGRAM (TEE);  Surgeon: Acie Fredrickson Wonda Cheng, MD;  Location: [facility] ENDOSCOPY;  Service: Cardiovascular;  Laterality: N/A;    Social History   Socioeconomic History  . Marital status: Married    Spouse name: Not on file  . Number of children: 71  . Years of education: 83  . Highest education level: Not on file  Occupational History  . Occupation: [employer]  Tobacco Use  . Smoking status: Never Smoker  . Smokeless tobacco: Never Used  Vaping Use  . Vaping Use: Never used  Substance and Sexual Activity  . Alcohol use: No  . Drug use: No  . Sexual activity: Not on file  Other Topics Concern  . Not on file  Social History Narrative   Fun: Mentoring youth, solitary games.    Denies any religious beliefs effecting health care.    Social  Determinants of Health   Financial Resource Strain:   . Difficulty of Paying Living Expenses: Not on file  Food Insecurity:   . Worried About Charity fundraiser in the Last Year: Not on file  . Ran Out of Food in the Last Year: Not on file  Transportation Needs:   . Lack of Transportation (Medical): Not on file  . Lack of Transportation (Non-Medical): Not on file  Physical Activity:   . Days of Exercise per Week: Not on file  . Minutes of Exercise per Session: Not on file  Stress:   . Feeling of Stress : Not on file  Social Connections:   . Frequency of Communication with Friends and Family: Not on file  . Frequency of Social Gatherings with Friends and Family: Not on file  . Attends Religious Services: Not on file  . Active Member of  Clubs or Organizations: Not on file  . Attends Archivist Meetings: Not on file  . Marital Status: Not on file    Family History  Problem Relation Age of Onset  . Ovarian cancer Mother     Review of Systems  Constitutional: Negative for chills and fever.  Skin: Positive for color change and wound.       Objective:   Vitals:   01/30/20 1022  BP: 128/72  Pulse: 72  Temp: 98.3 F (36.8 C)  SpO2: 98%   BP Readings from Last 3 Encounters:  01/30/20 128/72  01/04/20 115/82  01/02/20 125/75   Wt Readings from Last 3 Encounters:  01/30/20 213 lb (96.6 kg)  01/04/20 220 lb (99.8 kg)  01/01/20 220 lb (99.8 kg)   Body mass index is 25.93 kg/m.   Physical Exam    Constitutional: Appears well-developed and well-nourished. No distress.  Skin: Skin is warm and dry. Not diaphoretic. Right index finger w/ fingernail missing, peeling skin posterior distal finger in two locations, area of skin missing between DIP and PIP- w/o erythema or discharge.  Normal sensation in finger.  Decreased flexion      Assessment & Plan:    See Problem List for Assessment and Plan of chronic medical problems.    This visit occurred during the SARS-CoV-2 public health emergency.  Safety protocols were in place, including screening questions prior to the visit, additional usage of staff PPE, and extensive cleaning of exam room while observing appropriate contact time as indicated for disinfecting solutions.

## 2020-01-30 NOTE — Patient Instructions (Addendum)
Use the Bactroban ointment twice daily on your finger.   Monitor for infection.  Call with concerns.

## 2020-03-12 ENCOUNTER — Other Ambulatory Visit: Payer: Self-pay | Admitting: Cardiovascular Disease

## 2020-03-12 IMAGING — CT CT ANGIO CHEST
2 of 7 series · 18 of 46 positions shown · IV contrast (APPLIED)
Comparison: Same day CXR

CLINICAL DATA: Weakness and fatigue with intermittent dyspnea. One
episode of vomiting today. No reported chest pain.

EXAM:
CT ANGIOGRAPHY CHEST WITH CONTRAST
TECHNIQUE: Multidetector CT imaging of the chest was performed using the
standard protocol during bolus administration of intravenous
contrast. Multiplanar CT image reconstructions and MIPs were
obtained to evaluate the vascular anatomy.
CONTRAST:  100mL ECFXUK-NBS IOPAMIDOL (ECFXUK-NBS) INJECTION 76%

[Series 8: thins · axial · 0.80mm/px · z∈[+641,+938]mm · 15 of 478 slices shown]
[im 27/478  lung]
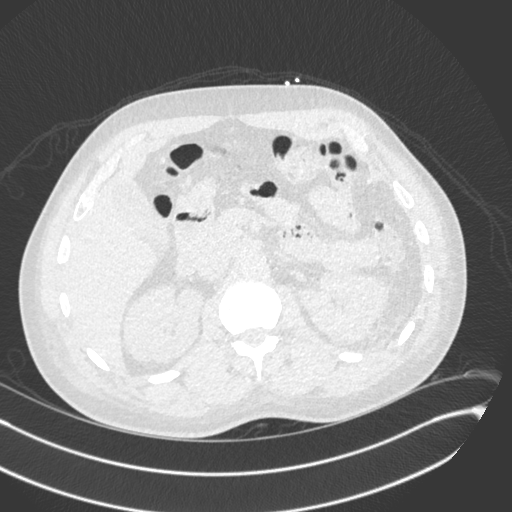
[im 54/478  soft-tissue]
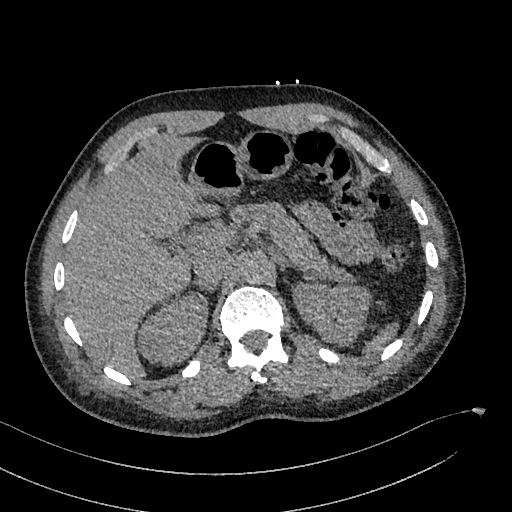
[im 80/478  lung]
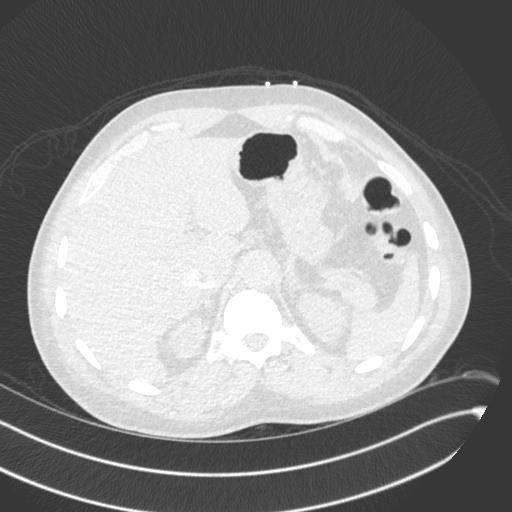
[im 107/478  soft-tissue]
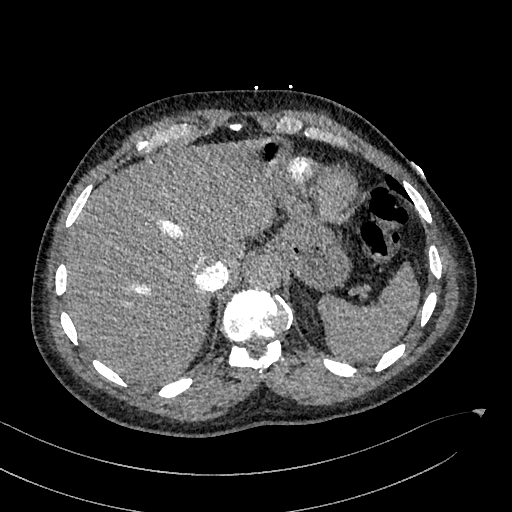
[im 160/478  lung]
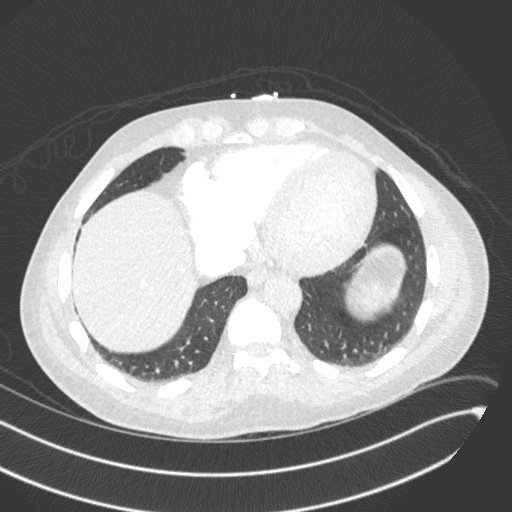
[im 186/478  soft-tissue]
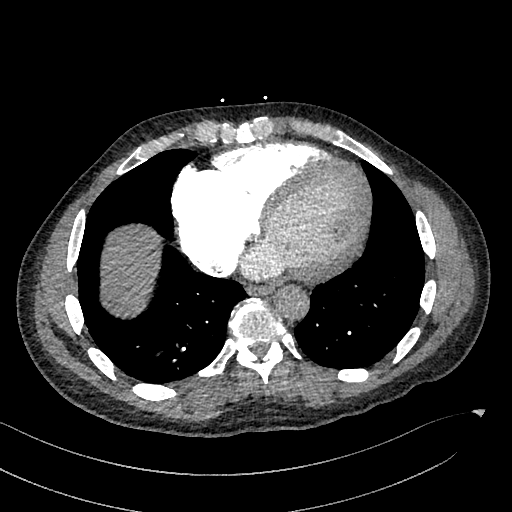
[im 213/478  lung]
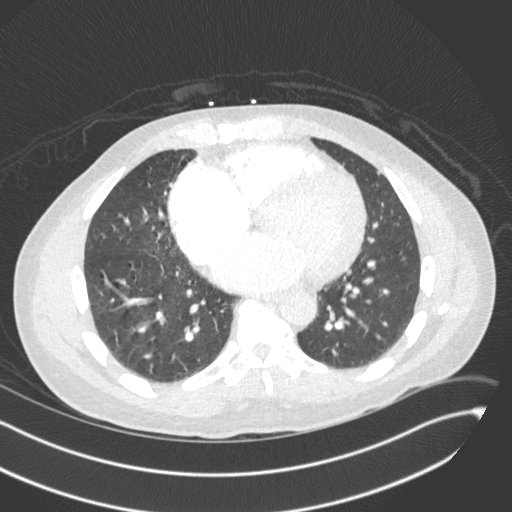
[im 239/478  soft-tissue]
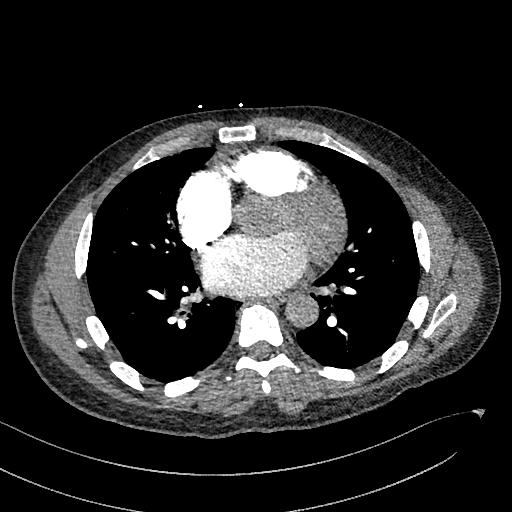
[im 266/478  lung]
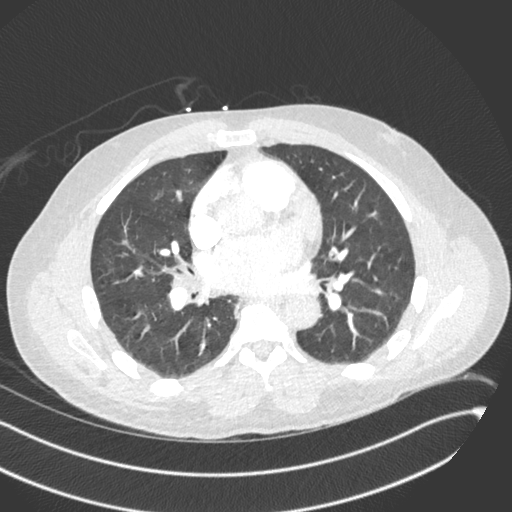
[im 292/478  soft-tissue]
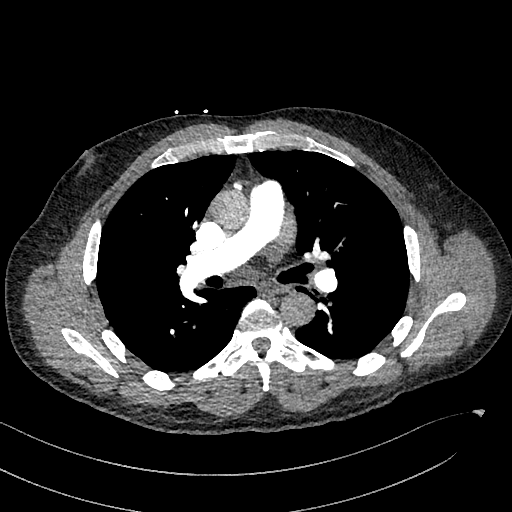
[im 319/478  lung]
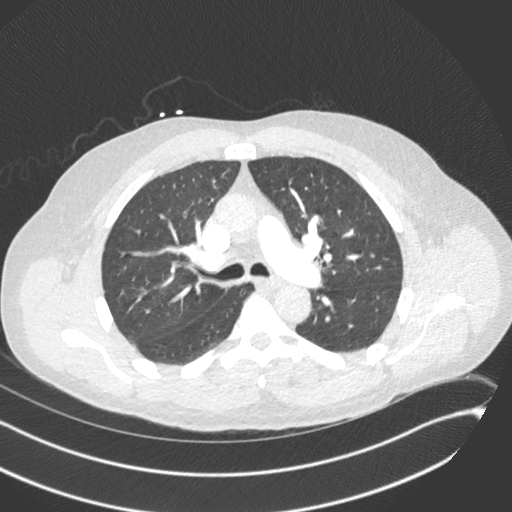
[im 372/478  soft-tissue]
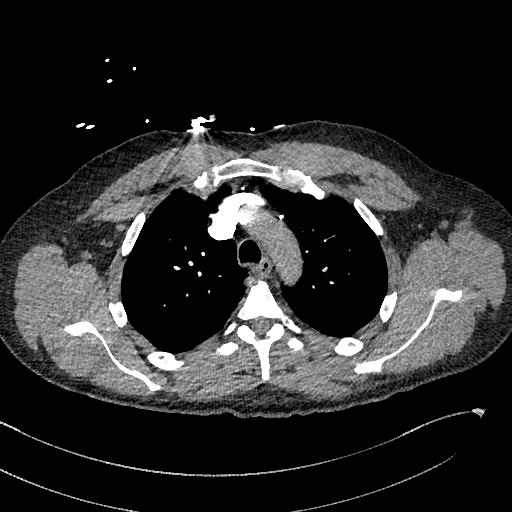
[im 398/478  lung]
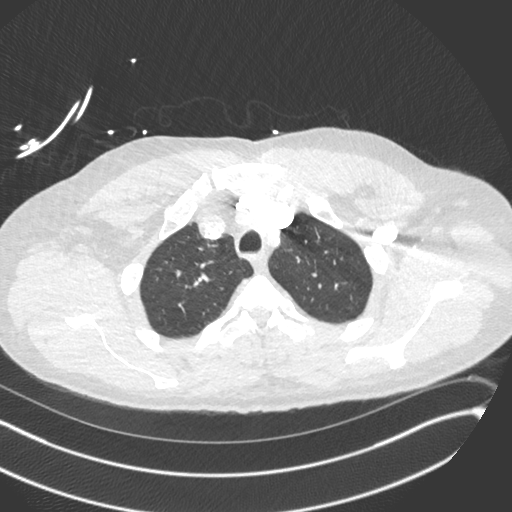
[im 425/478  soft-tissue]
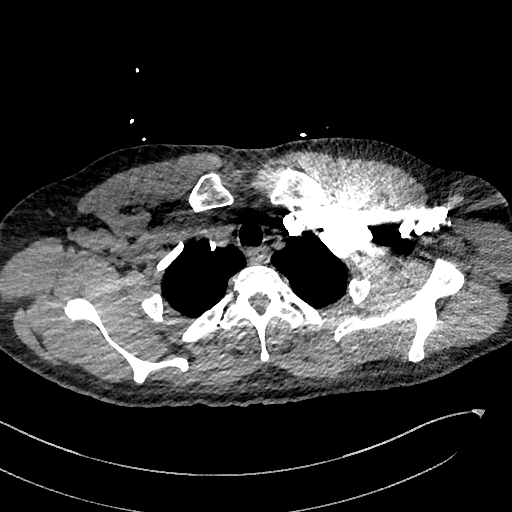
[im 451/478  lung]
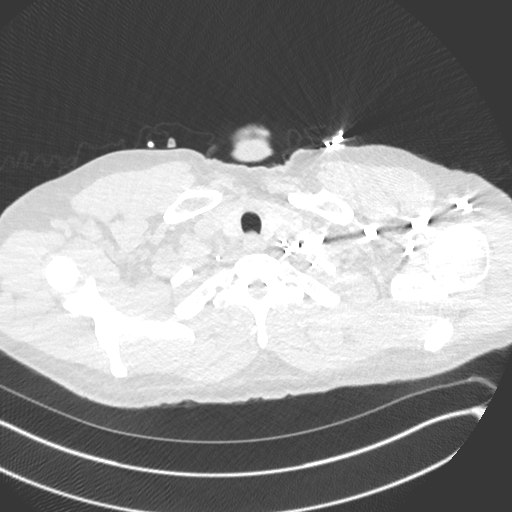

[Series 9: cor · coronal · 0.67mm/px · 3 of 151 slices shown]
[im 38/151  soft-tissue]
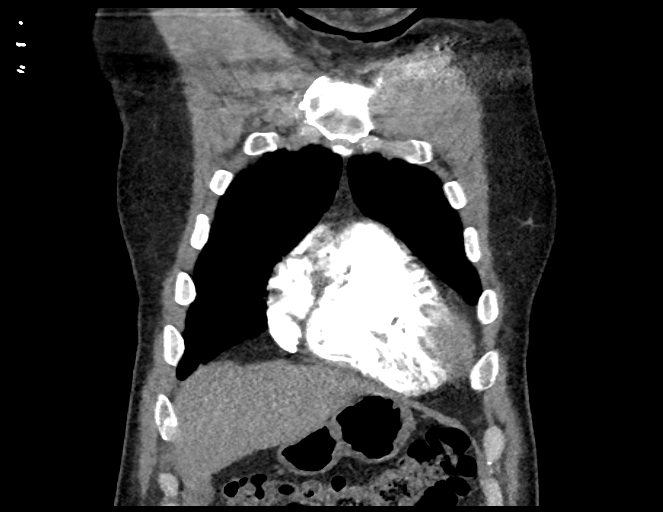
[im 76/151  soft-tissue]
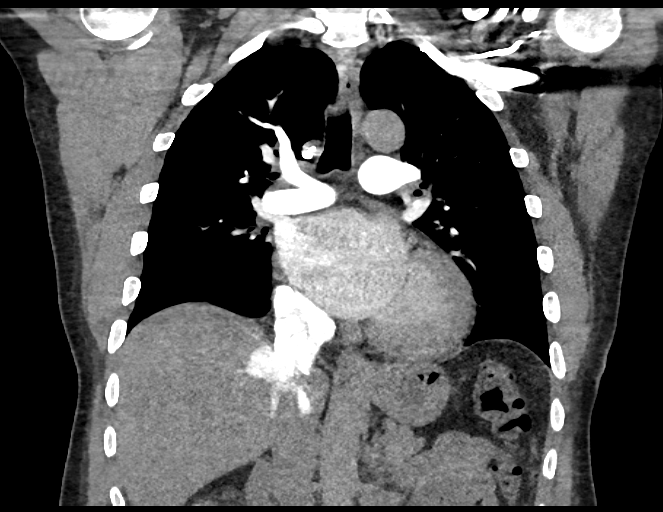
[im 113/151  soft-tissue]
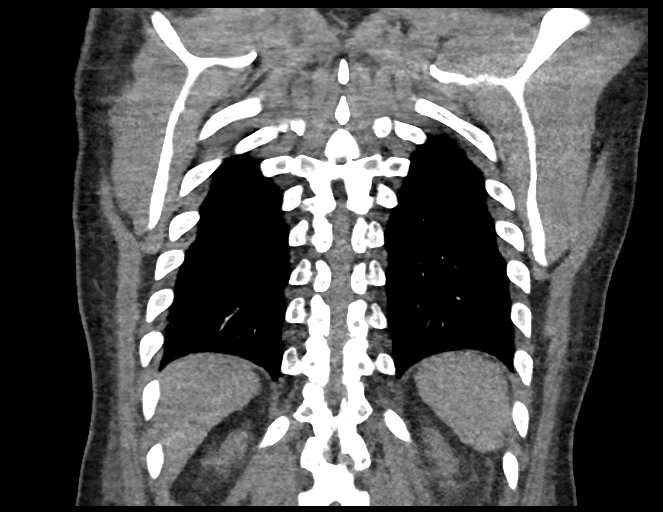

[18 of 46 positions shown; findings below may reference images not displayed]

FINDINGS: Cardiovascular: Cardiomegaly is noted with right atrial enlargement.
No pericardial effusion is seen. Satisfactory opacification of the
pulmonary arteries without acute pulmonary embolus to the segmental
level. Reflux of contrast is noted into the hepatic veins. No
dilatation of main pulmonary artery to suggest pulmonary
hypertension as an etiology. Nonaneurysmal thoracic aorta.

Mediastinum/Nodes: No enlarged mediastinal, hilar, or axillary lymph
nodes. Thyroid gland, trachea, and esophagus demonstrate no
significant findings.

Lungs/Pleura: Mild bilateral diffuse peribronchial thickening. No
pulmonary consolidation, effusion or pneumothorax. No dominant mass.

Upper Abdomen: No acute abnormality.

Musculoskeletal: No chest wall abnormality. No acute or significant
osseous findings.

Review of the MIP images confirms the above findings.
IMPRESSION: No acute pulmonary embolus.

Cardiomegaly with right atrial enlargement and reflux of contrast
into the right hepatic veins raising suspicion possible right heart
failure.

Diffuse mild peribronchial thickening without acute pulmonary
consolidation.

## 2020-03-31 NOTE — Progress Notes (Signed)
Evaluation Performed:  Follow-up visit  Date:  04/03/2020   ID:  Carlos Harrison, DOB [birth year], MRN [id number]   PCP:  Pincus Sanes, MD  Cardiologist:  Charlton Haws, MD   Electrophysiologist:  None   Chief Complaint:  PAF/CHF  History of Present Illness:    60 y.o. HTN, HLD, PAF and chronic systolic CHF. 11/15/17 admitted with rapid afib/flutter. TEE/DCC 11/16/17 EF during arrhythmia 15-20%Improved since [facility]. Seen by Ilda Basset D and now on entresto 49/51 mg, aldactone 12.5 mg, and Toprol 50 mg Anticoagulation with normal dose xarelto.   Last echo done 02/15/18 reviewed EF improved to 45-50%  Cardiac CTA 09/19/18 calcium score 0 normal right dominant coronary arteries   Home with all his kids during COVID crisis not very active but no cardiac Complaints   Injured his left index finger biting nails and infected with oil. Had Tdap and antibiotics f/u with primary healed   He is working at [employer] now  Was at [employer] and had a vasovagal event a few weeks ago   Past Medical History:  Diagnosis Date  . Atrial fibrillation (HCC) 10/2017  . Cardiomyopathy (HCC) 11/10/2017  . Dyspnea   . Hypertension   . Palpitations 10/26/2017   Past Surgical History:  Procedure Laterality Date  . CARDIOVERSION N/A 11/16/2017   Procedure: CARDIOVERSION;  Surgeon: Elease Hashimoto Deloris Ping, MD;  Location: [facility] ENDOSCOPY;  Service: Cardiovascular;  Laterality: N/A;  . EYE SURGERY Left   . TEE WITH CARDIOVERSION  11/16/2017  . TEE WITHOUT CARDIOVERSION N/A 11/16/2017   Procedure: TRANSESOPHAGEAL ECHOCARDIOGRAM (TEE);  Surgeon: Vesta Mixer, MD;  Location: [facility] ENDOSCOPY;  Service: Cardiovascular;  Laterality: N/A;     Current Meds  Medication Sig  . metoprolol succinate (TOPROL-XL) 50 MG 24 hr tablet Take 1 tablet (50 mg total) by mouth daily.  . rosuvastatin (CRESTOR) 10 MG tablet Take 1 tablet by mouth once daily Annual appt due in July must see provider for future refills   . sacubitril-valsartan (ENTRESTO) 49-51 MG Take 1 tablet by mouth 2 (two) times daily.  Marland Kitchen spironolactone (ALDACTONE) 25 MG tablet Take 0.5 tablets (12.5 mg total) by mouth daily. Please keep upcoming appt in January 2022 with Dr. Eden Emms before anymore refills. Thank you  . XARELTO 20 MG TABS tablet TAKE 1 TABLET (20 MG TOTAL) BY MOUTH DAILY WITH SUPPER.     Allergies:   Patient has no known allergies.   Social History   Tobacco Use  . Smoking status: Never Smoker  . Smokeless tobacco: Never Used  Vaping Use  . Vaping Use: Never used  Substance Use Topics  . Alcohol use: No  . Drug use: No     Family Hx: The patient's family history includes Ovarian cancer in his mother.  ROS:   Please see the history of present illness.     All other systems reviewed and are negative.   Prior CV studies:   The following studies were reviewed today:  Echo November 2019  Labs/Other Tests and Data Reviewed:    EKG: SR rate 54 normal 01/23/18  04/03/2020 SR rate 63 normal   Recent Labs: 05/02/2019: ALT 15; BUN 13; Creatinine, Ser 1.05; Hemoglobin 12.0; Platelets 184.0; Potassium 4.1; Sodium 140; TSH 1.51   Recent Lipid Panel Lab Results  Component Value Date/Time   CHOL 156 05/02/2019 02:07 PM   TRIG 69.0 05/02/2019 02:07 PM   HDL 49.40 05/02/2019 02:07 PM   CHOLHDL 3 05/02/2019 02:07  PM   [place] 93 05/02/2019 02:07 PM    Wt Readings from Last 3 Encounters:  04/03/20 96.2 kg  01/30/20 96.6 kg  01/04/20 99.8 kg     Objective:    Vital Signs:  BP 120/78   Pulse 63   Ht 6\' 3"  (1.905 m)   Wt 96.2 kg   SpO2 91%   BMI 26.50 kg/m    Affect appropriate Healthy:  appears stated age HEENT: normal Neck supple with no adenopathy JVP normal no bruits no thyromegaly Lungs clear with no wheezing and good diaphragmatic motion Heart:  S1/S2 no murmur, no rub, gallop or click PMI normal Abdomen: benighn, BS positve, no tenderness, no AAA no bruit.  No HSM or HJR Distal pulses  intact with no bruits No edema Neuro non-focal Skin warm and dry No muscular weakness    ASSESSMENT & PLAN:    1. PAF: maintaining NSR continue Lopresor and xarelto   2. DCM:  EF improved on goal directed Rx including enteresto EF 45-50% by TTE 01/2018 Cardiac CTA June 2020 with calcium score 0 and normal right dominant coronary arteries Update echo  3. HLD:  On statin labs with primary   COVID-19 Education: The signs and symptoms of COVID-19 were discussed with the patient and how to seek care for testing (follow up with PCP or arrange E-visit).  The importance of social distancing was discussed today.   Medication Adjustments/Labs and Tests Ordered: Current medicines are reviewed at length with the patient today.  Concerns regarding medicines are outlined above.   Tests Ordered:  Echo for non ischemic DCM   Medication Changes: No orders of the defined types were placed in this encounter.  Time spent reviewing chart , writing note and direct televisit 20 mintues   Disposition:  Follow up  In a year   Signed, Jenkins Rouge, MD  04/03/2020 4:40 PM    [place]

## 2020-04-03 ENCOUNTER — Encounter: Payer: Self-pay | Admitting: Cardiovascular Disease

## 2020-04-03 ENCOUNTER — Other Ambulatory Visit: Payer: Self-pay

## 2020-04-03 ENCOUNTER — Ambulatory Visit (INDEPENDENT_AMBULATORY_CARE_PROVIDER_SITE_OTHER): Payer: 59 | Admitting: Cardiovascular Disease

## 2020-04-03 VITALS — BP 120/78 | HR 63 | Ht 75.0 in | Wt 212.0 lb

## 2020-04-03 DIAGNOSIS — I420 Dilated cardiomyopathy: Secondary | ICD-10-CM

## 2020-04-03 NOTE — Patient Instructions (Signed)
Medication Instructions:  *If you need a refill on your cardiac medications before your next appointment, please call your pharmacy*  Lab Work: If you have labs (blood work) drawn today and your tests are completely normal, you will receive your results only by: . MyChart Message (if you have MyChart) OR . A paper copy in the mail If you have any lab test that is abnormal or we need to change your treatment, we will call you to review the results.  Testing/Procedures: Your physician has requested that you have an echocardiogram. Echocardiography is a painless test that uses sound waves to create images of your heart. It provides your doctor with information about the size and shape of your heart and how well your heart's chambers and valves are working. This procedure takes approximately one hour. There are no restrictions for this procedure.  Follow-Up: At [facility], you and your health needs are our priority.  As part of our continuing mission to provide you with exceptional heart care, we have created designated Provider Care Teams.  These Care Teams include your primary Cardiologist (physician) and Advanced Practice Providers (APPs -  Physician Assistants and Nurse Practitioners) who all work together to provide you with the care you need, when you need it.  We recommend signing up for the patient portal called "MyChart".  Sign up information is provided on this After Visit Summary.  MyChart is used to connect with patients for Virtual Visits (Telemedicine).  Patients are able to view lab/test results, encounter notes, upcoming appointments, etc.  Non-urgent messages can be sent to your provider as well.   To learn more about what you can do with MyChart, go to [email or web address].    Your next appointment:   1 year(s)  The format for your next appointment:   In Person  Provider:   You may see Peter Nishan, MD or one of the following Advanced Practice Providers on your  designated Care Team:    Lori Gerhardt, NP  Laura Ingold, NP  Jill McDaniel, NP    

## 2020-04-16 ENCOUNTER — Other Ambulatory Visit: Payer: Self-pay | Admitting: Internal Medicine

## 2020-04-23 ENCOUNTER — Ambulatory Visit (HOSPITAL_COMMUNITY): Payer: BC Managed Care – PPO | Attending: Internal Medicine

## 2020-04-23 ENCOUNTER — Other Ambulatory Visit: Payer: Self-pay

## 2020-04-23 DIAGNOSIS — I420 Dilated cardiomyopathy: Secondary | ICD-10-CM | POA: Insufficient documentation

## 2020-04-23 LAB — ECHOCARDIOGRAM COMPLETE
Area-P 1/2: 3.45 cm2
S' Lateral: 3.4 cm

## 2020-04-24 ENCOUNTER — Other Ambulatory Visit: Payer: Self-pay | Admitting: Cardiovascular Disease

## 2020-04-24 ENCOUNTER — Telehealth: Payer: Self-pay | Admitting: Cardiovascular Disease

## 2020-04-24 NOTE — Telephone Encounter (Signed)
Follow Up:    Pt is returning Pam Ingall's call from today, concerning his Echo results.Marland Kitchen

## 2020-04-24 NOTE — Telephone Encounter (Signed)
The patient has been notified of the result and verbalized understanding.  All questions (if any) were answered. Michaelyn Barter, RN 04/24/2020 3:57 PM   Patient also wondering about Xarelto and getting patient assistance. Gave patient number for Patient Assistance [employer] and will send note to our pre auth nurse.

## 2020-04-25 NOTE — Telephone Encounter (Signed)
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 He is aware that we are leaving him a $10 Xarelto co-card in the front office at Dr Mariana Arn office for him to pick up. He is aware to take the card with him when picking his Xarelto refill up at his pharmacy.  He thanked me for my assistance.

## 2020-05-06 DIAGNOSIS — D649 Anemia, unspecified: Secondary | ICD-10-CM | POA: Insufficient documentation

## 2020-05-06 NOTE — Progress Notes (Signed)
Subjective:    Patient ID: Carlos Harrison, male    DOB: 1960-03-30, 60 y.o.   MRN: [id number]  HPI He is here for a physical exam.     Medications and allergies reviewed with patient and updated if appropriate.  Patient Active Problem List   Diagnosis Date Noted  . Anemia 05/06/2020  . Paronychia of right index finger 01/17/2020  . Neck pain 04/26/2018  . Dilated cardiomyopathy ([place]) 04/26/2018  . Atrial flutter, paroxysmal ([place]) 10/26/2017  . Prediabetes 04/27/2017  . Hypertension 02/21/2017  . Hyperlipidemia 02/21/2017  . Sacroiliac joint dysfunction of right side 07/30/2014    Current Outpatient Medications on File Prior to Visit  Medication Sig Dispense Refill  . metoprolol succinate (TOPROL-XL) 50 MG 24 hr tablet TAKE 1 TABLET BY MOUTH EVERY DAY 90 tablet 3  . rosuvastatin (CRESTOR) 10 MG tablet Take 1 tablet by mouth once daily Annual appt due in July must see provider for future refills 90 tablet 1  . sacubitril-valsartan (ENTRESTO) 49-51 MG Take 1 tablet by mouth 2 (two) times daily. 5 tablet 0  . spironolactone (ALDACTONE) 25 MG tablet Take 0.5 tablets (12.5 mg total) by mouth daily. Please keep upcoming appt in January 2022 with Dr. Johnsie Cancel before anymore refills. Thank you 45 tablet 0  . XARELTO 20 MG TABS tablet TAKE 1 TABLET BY MOUTH DAILY WITH SUPPER. 90 tablet 0   No current facility-administered medications on file prior to visit.    Past Medical History:  Diagnosis Date  . Atrial fibrillation ([place]) 10/2017  . Cardiomyopathy ([place]) 11/10/2017  . Dyspnea   . Hypertension   . Palpitations 10/26/2017    Past Surgical History:  Procedure Laterality Date  . CARDIOVERSION N/A 11/16/2017   Procedure: CARDIOVERSION;  Surgeon: Acie Fredrickson Wonda Cheng, MD;  Location: [facility];  Service: Cardiovascular;  Laterality: N/A;  . EYE SURGERY Left   . TEE WITH CARDIOVERSION  11/16/2017  . TEE WITHOUT CARDIOVERSION N/A 11/16/2017   Procedure: TRANSESOPHAGEAL  ECHOCARDIOGRAM (TEE);  Surgeon: Acie Fredrickson Wonda Cheng, MD;  Location: [facility] ENDOSCOPY;  Service: Cardiovascular;  Laterality: N/A;    Social History   Socioeconomic History  . Marital status: Married    Spouse name: Not on file  . Number of children: 15  . Years of education: 72  . Highest education level: Not on file  Occupational History  . Occupation: [employer]  Tobacco Use  . Smoking status: Never Smoker  . Smokeless tobacco: Never Used  Vaping Use  . Vaping Use: Never used  Substance and Sexual Activity  . Alcohol use: No  . Drug use: No  . Sexual activity: Not on file  Other Topics Concern  . Not on file  Social History Narrative   Fun: Mentoring youth, solitary games.    Denies any religious beliefs effecting health care.    Social Determinants of Health   Financial Resource Strain: Not on file  Food Insecurity: Not on file  Transportation Needs: Not on file  Physical Activity: Not on file  Stress: Not on file  Social Connections: Not on file    Family History  Problem Relation Age of Onset  . Ovarian cancer Mother     Review of Systems  Constitutional: Negative for chills and fever.  Eyes: Negative for visual disturbance.  Respiratory: Negative for cough, shortness of breath and wheezing.   Cardiovascular: Negative for chest pain, palpitations and leg swelling.  Gastrointestinal: Positive for anal bleeding (hemorrhoids) and nausea (mild, occasionally).  Negative for abdominal pain, blood in stool, constipation and diarrhea.       No gerd  Genitourinary: Negative for difficulty urinating, dysuria and hematuria.  Musculoskeletal: Positive for back pain (intermittent). Negative for arthralgias.  Skin: Negative for color change and rash.  Neurological: Negative for dizziness, light-headedness, numbness and headaches.  Psychiatric/Behavioral: Negative for dysphoric mood. The patient is not nervous/anxious.        Objective:   Vitals:   05/07/20 1101  BP:  122/78  Pulse: 61  Temp: 98.3 F (36.8 C)  SpO2: 99%   Filed Weights   05/07/20 1101  Weight: 217 lb (98.4 kg)   Body mass index is 27.12 kg/m.  BP Readings from Last 3 Encounters:  05/07/20 122/78  04/03/20 120/78  01/30/20 128/72    Wt Readings from Last 3 Encounters:  05/07/20 217 lb (98.4 kg)  04/03/20 212 lb (96.2 kg)  01/30/20 213 lb (96.6 kg)     Physical Exam Constitutional: He appears well-developed and well-nourished. No distress.  HENT:  Head: Normocephalic and atraumatic.  Right Ear: External ear normal.  Left Ear: External ear normal.  Mouth/Throat: Oropharynx is clear and moist.  Normal ear canals and TM b/l  Eyes: Conjunctivae and EOM are normal.  Neck: Neck supple. No tracheal deviation present. No thyromegaly present.  No carotid bruit  Cardiovascular: Normal rate, regular rhythm, normal heart sounds and intact distal pulses.   No murmur heard. Pulmonary/Chest: Effort normal and breath sounds normal. No respiratory distress. He has no wheezes. He has no rales.  Abdominal: Soft. He exhibits no distension. There is no tenderness.  Genitourinary: deferred  Musculoskeletal: He exhibits no edema.  Lymphadenopathy:   He has no cervical adenopathy.  Skin: Skin is warm and dry. He is not diaphoretic.  Psychiatric: He has a normal mood and affect. His behavior is normal.         Assessment & Plan:   Physical exam: Screening blood work  ordered Immunizations   Had J & J - will booster, discussed shingrix Colonoscopy   Due - referred today Eye exams   Up to date  Exercise   Walks a lot at home. Weight  Mildly overweight Substance abuse   none  See Problem List for Assessment and Plan of chronic medical problems.   This visit occurred during the SARS-CoV-2 public health emergency.  Safety protocols were in place, including screening questions prior to the visit, additional usage of staff PPE, and extensive cleaning of exam room while observing  appropriate contact time as indicated for disinfecting solutions.

## 2020-05-06 NOTE — Patient Instructions (Addendum)
Blood work was ordered.     No immunization administered today.   Medications changes include :   none    A referral was ordered for GI for your colonoscopy.     Someone will call you to schedule an appointment.    Please followup in 6 months    Health Maintenance, Male Adopting a healthy lifestyle and getting preventive care are important in promoting health and wellness. Ask your health care provider about:  The right schedule for you to have regular tests and exams.  Things you can do on your own to prevent diseases and keep yourself healthy. What should I know about diet, weight, and exercise? Eat a healthy diet  Eat a diet that includes plenty of vegetables, fruits, low-fat dairy products, and lean protein.  Do not eat a lot of foods that are high in solid fats, added sugars, or sodium.   Maintain a healthy weight Body mass index (BMI) is a measurement that can be used to identify possible weight problems. It estimates body fat based on height and weight. Your health care provider can help determine your BMI and help you achieve or maintain a healthy weight. Get regular exercise Get regular exercise. This is one of the most important things you can do for your health. Most adults should:  Exercise for at least 150 minutes each week. The exercise should increase your heart rate and make you sweat (moderate-intensity exercise).  Do strengthening exercises at least twice a week. This is in addition to the moderate-intensity exercise.  Spend less time sitting. Even light physical activity can be beneficial. Watch cholesterol and blood lipids Have your blood tested for lipids and cholesterol at 58 years of age, then have this test every 5 years. You may need to have your cholesterol levels checked more often if:  Your lipid or cholesterol levels are high.  You are older than 58 years of age.  You are at high risk for heart disease. What should I know about cancer  screening? Many types of cancers can be detected early and may often be prevented. Depending on your health history and family history, you may need to have cancer screening at various ages. This may include screening for:  Colorectal cancer.  Prostate cancer.  Skin cancer.  Lung cancer. What should I know about heart disease, diabetes, and high blood pressure? Blood pressure and heart disease  High blood pressure causes heart disease and increases the risk of stroke. This is more likely to develop in people who have high blood pressure readings, are of African descent, or are overweight.  Talk with your health care provider about your target blood pressure readings.  Have your blood pressure checked: ? Every 3-5 years if you are 39-43 years of age. ? Every year if you are 92 years old or older.  If you are between the ages of 75 and 69 and are a current or former smoker, ask your health care provider if you should have a one-time screening for abdominal aortic aneurysm (AAA). Diabetes Have regular diabetes screenings. This checks your fasting blood sugar level. Have the screening done:  Once every three years after age 59 if you are at a normal weight and have a low risk for diabetes.  More often and at a younger age if you are overweight or have a high risk for diabetes. What should I know about preventing infection? Hepatitis B If you have a higher risk for hepatitis B, you should  be screened for this virus. Talk with your health care provider to find out if you are at risk for hepatitis B infection. Hepatitis C Blood testing is recommended for:  Everyone born from [birth year] through 1963.  Anyone with known risk factors for hepatitis C. Sexually transmitted infections (STIs)  You should be screened each year for STIs, including gonorrhea and chlamydia, if: ? You are sexually active and are younger than 58 years of age. ? You are older than 58 years of age and your health care  provider tells you that you are at risk for this type of infection. ? Your sexual activity has changed since you were last screened, and you are at increased risk for chlamydia or gonorrhea. Ask your health care provider if you are at risk.  Ask your health care provider about whether you are at high risk for HIV. Your health care provider may recommend a prescription medicine to help prevent HIV infection. If you choose to take medicine to prevent HIV, you should first get tested for HIV. You should then be tested every 3 months for as long as you are taking the medicine. Follow these instructions at home: Lifestyle  Do not use any products that contain nicotine or tobacco, such as cigarettes, e-cigarettes, and chewing tobacco. If you need help quitting, ask your health care provider.  Do not use street drugs.  Do not share needles.  Ask your health care provider for help if you need support or information about quitting drugs. Alcohol use  Do not drink alcohol if your health care provider tells you not to drink.  If you drink alcohol: ? Limit how much you have to 0-2 drinks a day. ? Be aware of how much alcohol is in your drink. In the [place] one drink equals one 12 oz bottle of beer (355 mL), one 5 oz glass of wine (148 mL), or one 1 oz glass of hard liquor (44 mL). General instructions  Schedule regular health, dental, and eye exams.  Stay current with your vaccines.  Tell your health care provider if: ? You often feel depressed. ? You have ever been abused or do not feel safe at home. Summary  Adopting a healthy lifestyle and getting preventive care are important in promoting health and wellness.  Follow your health care provider's instructions about healthy diet, exercising, and getting tested or screened for diseases.  Follow your health care provider's instructions on monitoring your cholesterol and blood pressure. This information is not intended to replace advice given  to you by your health care provider. Make sure you discuss any questions you have with your health care provider. Document Revised: 03/08/2018 Document Reviewed: 03/08/2018 Elsevier Patient Education  2021 [employer].

## 2020-05-07 ENCOUNTER — Ambulatory Visit (INDEPENDENT_AMBULATORY_CARE_PROVIDER_SITE_OTHER): Payer: 59 | Admitting: Internal Medicine

## 2020-05-07 ENCOUNTER — Encounter: Payer: Self-pay | Admitting: Internal Medicine

## 2020-05-07 ENCOUNTER — Other Ambulatory Visit: Payer: Self-pay

## 2020-05-07 VITALS — BP 122/78 | HR 61 | Temp 98.3°F | Ht 75.0 in | Wt 217.0 lb

## 2020-05-07 DIAGNOSIS — I4892 Unspecified atrial flutter: Secondary | ICD-10-CM

## 2020-05-07 DIAGNOSIS — I10 Essential (primary) hypertension: Secondary | ICD-10-CM | POA: Diagnosis not present

## 2020-05-07 DIAGNOSIS — R7303 Prediabetes: Secondary | ICD-10-CM | POA: Diagnosis not present

## 2020-05-07 DIAGNOSIS — Z125 Encounter for screening for malignant neoplasm of prostate: Secondary | ICD-10-CM

## 2020-05-07 DIAGNOSIS — Z1211 Encounter for screening for malignant neoplasm of colon: Secondary | ICD-10-CM

## 2020-05-07 DIAGNOSIS — D649 Anemia, unspecified: Secondary | ICD-10-CM | POA: Diagnosis not present

## 2020-05-07 DIAGNOSIS — Z0000 Encounter for general adult medical examination without abnormal findings: Secondary | ICD-10-CM

## 2020-05-07 DIAGNOSIS — E7849 Other hyperlipidemia: Secondary | ICD-10-CM

## 2020-05-07 DIAGNOSIS — I420 Dilated cardiomyopathy: Secondary | ICD-10-CM

## 2020-05-07 LAB — LIPID PANEL
Cholesterol: 188 mg/dL (ref 0–200)
HDL: 51.5 mg/dL (ref >39.00)
NonHDL: 136.55
Total CHOL/HDL Ratio: 4
Triglycerides: 201 mg/dL — ABNORMAL HIGH (ref 0.0–149.0)
VLDL: 40.2 mg/dL — ABNORMAL HIGH (ref 0.0–40.0)

## 2020-05-07 LAB — COMPREHENSIVE METABOLIC PANEL
ALT: 15 U/L (ref 0–53)
AST: 21 U/L (ref 0–37)
Albumin: 4.1 g/dL (ref 3.5–5.2)
Alkaline Phosphatase: 45 U/L (ref 39–117)
BUN: 12 mg/dL (ref 6–23)
CO2: 31 mEq/L (ref 19–32)
Calcium: 9.3 mg/dL (ref 8.4–10.5)
Chloride: 103 mEq/L (ref 96–112)
Creatinine, Ser: 1.02 mg/dL (ref 0.40–1.50)
GFR: 81.47 mL/min (ref >60.00)
Glucose, Bld: 102 mg/dL — ABNORMAL HIGH (ref 70–99)
Potassium: 3.8 mEq/L (ref 3.5–5.1)
Sodium: 138 mEq/L (ref 135–145)
Total Bilirubin: 0.6 mg/dL (ref 0.2–1.2)
Total Protein: 7.4 g/dL (ref 6.0–8.3)

## 2020-05-07 LAB — CBC WITH DIFFERENTIAL/PLATELET
Basophils Absolute: 0 10*3/uL (ref 0.0–0.1)
Basophils Relative: 0.6 % (ref 0.0–3.0)
Eosinophils Absolute: 0.2 10*3/uL (ref 0.0–0.7)
Eosinophils Relative: 4.7 % (ref 0.0–5.0)
HCT: 36.5 % — ABNORMAL LOW (ref 39.0–52.0)
Hemoglobin: 12.1 g/dL — ABNORMAL LOW (ref 13.0–17.0)
Lymphocytes Relative: 38 % (ref 12.0–46.0)
Lymphs Abs: 1.2 10*3/uL (ref 0.7–4.0)
MCHC: 33.3 g/dL (ref 30.0–36.0)
MCV: 86.3 fl (ref 78.0–100.0)
Monocytes Absolute: 0.3 10*3/uL (ref 0.1–1.0)
Monocytes Relative: 7.9 % (ref 3.0–12.0)
Neutro Abs: 1.6 10*3/uL (ref 1.4–7.7)
Neutrophils Relative %: 48.8 % (ref 43.0–77.0)
Platelets: 162 10*3/uL (ref 150.0–400.0)
RBC: 4.23 Mil/uL (ref 4.22–5.81)
RDW: 15.3 % (ref 11.5–15.5)
WBC: 3.2 10*3/uL — ABNORMAL LOW (ref 4.0–10.5)

## 2020-05-07 LAB — HEMOGLOBIN A1C: Hgb A1c MFr Bld: 6.2 % (ref 4.6–6.5)

## 2020-05-07 LAB — TSH: TSH: 2.46 u[IU]/mL (ref 0.35–4.50)

## 2020-05-07 LAB — LDL CHOLESTEROL, DIRECT: Direct LDL: 108 mg/dL

## 2020-05-07 NOTE — Assessment & Plan Note (Signed)
Chronic Following with cardiology Asymptomatic, rate controlled Continue metoprolol XL 50 mg daily, Xarelto 20 mg daily CBC, CMP, TSH

## 2020-05-07 NOTE — Assessment & Plan Note (Signed)
Chronic Check lipid panel, CMP Continue Crestor 10 mg daily Regular exercise and healthy diet encouraged

## 2020-05-07 NOTE — Assessment & Plan Note (Signed)
Chronic, mild CBC, iron panel

## 2020-05-07 NOTE — Assessment & Plan Note (Signed)
Chronic BP well controlled Continue metoprolol XL 50 mg daily, Entresto 49-51 mg twice daily, spironolactone 12.5 mg daily cmp

## 2020-05-07 NOTE — Assessment & Plan Note (Signed)
Chronic Check a1c Low sugar / carb diet Stressed regular exercise  

## 2020-05-07 NOTE — Assessment & Plan Note (Signed)
Chronic Following with cardiology On Entresto, spironolactone, statin, metoprolol Overall stable Euvolemic, asymptomatic We will check CBC, CMP, lipids, TSH

## 2020-05-08 LAB — IRON,TIBC AND FERRITIN PANEL
%SAT: 18 % (calc) — ABNORMAL LOW (ref 20–48)
Ferritin: 14 ng/mL — ABNORMAL LOW (ref 38–380)
Iron: 58 ug/dL (ref 50–180)
TIBC: 316 mcg/dL (calc) (ref 250–425)

## 2020-05-08 LAB — PSA, TOTAL AND FREE
PSA, % Free: 30 % (calc) (ref >25)
PSA, Free: 0.3 ng/mL
PSA, Total: 1 ng/mL (ref <=4.0)

## 2020-05-19 ENCOUNTER — Other Ambulatory Visit: Payer: Self-pay

## 2020-05-19 MED ORDER — ENTRESTO 49-51 MG PO TABS: 1.0000 | ORAL_TABLET | Freq: Two times a day (BID) | ORAL | 3 refills | Status: DC

## 2020-05-20 ENCOUNTER — Encounter: Payer: Self-pay | Admitting: Gastroenterology

## 2020-06-05 ENCOUNTER — Other Ambulatory Visit: Payer: Self-pay

## 2020-06-05 ENCOUNTER — Ambulatory Visit (INDEPENDENT_AMBULATORY_CARE_PROVIDER_SITE_OTHER): Payer: 59 | Admitting: Gastroenterology

## 2020-06-05 ENCOUNTER — Encounter: Payer: Self-pay | Admitting: Gastroenterology

## 2020-06-05 VITALS — BP 142/100 | HR 64 | Ht 74.0 in | Wt 220.4 lb

## 2020-06-05 DIAGNOSIS — Z1211 Encounter for screening for malignant neoplasm of colon: Secondary | ICD-10-CM

## 2020-06-05 DIAGNOSIS — Z7901 Long term (current) use of anticoagulants: Secondary | ICD-10-CM | POA: Insufficient documentation

## 2020-06-05 MED ORDER — PLENVU 140 G PO SOLR: 1.0000 | Freq: Once | ORAL | 0 refills | Status: AC

## 2020-06-05 NOTE — Progress Notes (Signed)
06/05/2020 Carlos Harrison [id number] Aug 15, 1960   HISTORY OF PRESENT ILLNESS: This is a pleasant 60 year old male who is new to our office.  He is here today to discuss colonoscopy.  He has never had one in the past.  He has past medical history of hypertension, hyperlipidemia, atrial fibrillation.  Follows with Dr. Johnsie Cancel for cardiology.  He is on Xarelto.  He really has no complaints from a GI standpoint.  He says he sees occasional small amounts of bright red blood on the toilet paper that he attributes to hemorrhoids.  He moves his bowels well.  No other associated complaints.   Past Medical History:  Diagnosis Date  . Atrial fibrillation ([place]) 10/2017  . Cardiomyopathy ([place]) 11/10/2017  . Dyspnea   . Hyperlipidemia   . Hypertension   . Palpitations 10/26/2017   Past Surgical History:  Procedure Laterality Date  . CARDIOVERSION N/A 11/16/2017   Procedure: CARDIOVERSION;  Surgeon: Nahser, Wonda Cheng, MD;  Location: [facility] ENDOSCOPY;  Service: Cardiovascular;  Laterality: N/A;  . EYE SURGERY Left    age 63 or 89  . TEE WITH CARDIOVERSION  11/16/2017  . TEE WITHOUT CARDIOVERSION N/A 11/16/2017   Procedure: TRANSESOPHAGEAL ECHOCARDIOGRAM (TEE);  Surgeon: Acie Fredrickson Wonda Cheng, MD;  Location: [facility] ENDOSCOPY;  Service: Cardiovascular;  Laterality: N/A;    reports that he has never smoked. He has never used smokeless tobacco. He reports that he does not drink alcohol and does not use drugs. family history includes Cirrhosis in his brother; Diabetes in his brother; Heart disease in his brother and brother; Hypertension in his son; Ovarian cancer in his mother; Premature birth in his daughter; Seizures in his daughter. No Known Allergies    Outpatient Encounter Medications as of 06/05/2020  Medication Sig  . metoprolol succinate (TOPROL-XL) 50 MG 24 hr tablet TAKE 1 TABLET BY MOUTH EVERY DAY  . rosuvastatin (CRESTOR) 10 MG tablet Take 1 tablet by mouth once daily Annual appt due in July must see  provider for future refills  . sacubitril-valsartan (ENTRESTO) 49-51 MG Take 1 tablet by mouth 2 (two) times daily.  Marland Kitchen spironolactone (ALDACTONE) 25 MG tablet Take 0.5 tablets (12.5 mg total) by mouth daily. Please keep upcoming appt in January 2022 with Dr. Johnsie Cancel before anymore refills. Thank you  . XARELTO 20 MG TABS tablet TAKE 1 TABLET BY MOUTH DAILY WITH SUPPER.   No facility-administered encounter medications on file as of 06/05/2020.     REVIEW OF SYSTEMS  : All other systems reviewed and negative except where noted in the History of Present Illness.   PHYSICAL EXAM: BP (!) 142/100 (BP Location: Left Arm, Patient Position: Sitting, Cuff Size: Normal)   Pulse 64   Ht 6\' 2"  (1.88 m) Comment: height measured without shoes  Wt 220 lb 6 oz (100 kg)   BMI 28.29 kg/m  General: Well developed AA male in no acute distress Head: Normocephalic and atraumatic Eyes:  Sclerae anicteric, conjunctiva pink. Ears: Normal auditory acuity Lungs: Clear throughout to auscultation; no W/R/R. Heart: Regular rate and rhythm; no M/R/G. Abdomen: Soft, non-distended.  BS present.  Non-tender. Rectal:  Will be done at the time of colonoscopy. Musculoskeletal: Symmetrical with no gross deformities  Skin: No lesions on visible extremities Extremities: No edema  Neurological: Alert oriented x 4, grossly non-focal Psychological:  Alert and cooperative. Normal mood and affect  ASSESSMENT AND PLAN: *Colorectal cancer screening: Never had colonoscopy in the past.  We will schedule Dr. Tarri Glenn. *Chronic anticoagulation  with Xarelto due to atrial fibrillation:  Will hold Xarelto for 24 hours prior to endoscopic procedures - will instruct when and how to resume after procedure. Benefits and risks of procedure explained including risks of bleeding, perforation, infection, missed lesions, reactions to medications and possible need for hospitalization and surgery for complications. Additional rare but real risk of  stroke or other vascular clotting events off of Xarelto also explained and need to seek urgent help if any signs of these problems occur. Will communicate by phone or EMR with patient's prescribing provider, Dr. Johnsie Cancel, to confirm that holding Xarelto is reasonable in this case.    CC:  Carlos Rail, MD

## 2020-06-05 NOTE — Progress Notes (Signed)
Reviewed and agree with management plans. ? ?Ishaaq Penna L. Chelbi Herber, MD, MPH  ?

## 2020-06-05 NOTE — Patient Instructions (Signed)
If you are age 58 or older, your body mass index should be between 23-30. Your Body mass index is 28.29 kg/m. If this is out of the aforementioned range listed, please consider follow up with your Primary Care Provider.  If you are age 30 or younger, your body mass index should be between 19-25. Your Body mass index is 28.29 kg/m. If this is out of the aformentioned range listed, please consider follow up with your Primary Care Provider.   You have been scheduled for a colonoscopy. Please follow written instructions given to you at your visit today.  Please pick up your prep supplies at the pharmacy within the next 1-3 days. If you use inhalers (even only as needed), please bring them with you on the day of your procedure.  Due to recent changes in healthcare laws, you may see the results of your imaging and laboratory studies on MyChart before your provider has had a chance to review them.  We understand that in some cases there may be results that are confusing or concerning to you. Not all laboratory results come back in the same time frame and the provider may be waiting for multiple results in order to interpret others.  Please give [place] 48 hours in order for your provider to thoroughly review all the results before contacting the office for clarification of your results.

## 2020-06-06 ENCOUNTER — Telehealth: Payer: Self-pay | Admitting: *Deleted

## 2020-06-06 NOTE — Telephone Encounter (Signed)
   Primary Cardiologist: Jenkins Rouge, MD  Chart reviewed as part of pre-operative protocol coverage. Given past medical history and time since last visit, based on ACC/AHA guidelines, JACARIE PATE would be at acceptable risk for the planned procedure without further cardiovascular testing.   Patient with diagnosis of ATRIAL FIBRILLATION on [place] for anticoagulation.    Procedure: COLONOSCOPY Date of procedure: 08/04/2020  CHA2DS2-VASc Score = 2  This indicates a 2.2% annual risk of stroke. The patient's score is based upon: CHF History: Yes HTN History: Yes Diabetes History: No Stroke History: No Vascular Disease History: No Age Score: 0 Gender Score: 0   CrCl > 100 ML/MIN Platelet count 162  Per office protocol, patient can hold XARELTO for 24 HOURS prior to procedure.    patient should restart XARELTO on the evening of procedure or day after, at discretion of procedure MD  I will route this recommendation to the requesting party via [place] fax function and remove from pre-op pool.  Please call with questions.  Jossie Ng. Cleaver NP-C    06/06/2020, [address] Office [phone] Fax [phone]

## 2020-06-06 NOTE — Telephone Encounter (Signed)
Patient with diagnosis of ATRIAL FIBRILLATION on [place] for anticoagulation.    Procedure: COLONOSCOPY Date of procedure: 08/04/2020  CHA2DS2-VASc Score = 2  This indicates a 2.2% annual risk of stroke. The patient's score is based upon: CHF History: Yes HTN History: Yes Diabetes History: No Stroke History: No Vascular Disease History: No Age Score: 0 Gender Score: 0   CrCl > 100 ML/MIN Platelet count 162  Per office protocol, patient can hold XARELTO for 24 HOURS prior to procedure.    patient should restart XARELTO on the evening of procedure or day after, at discretion of procedure MD

## 2020-06-06 NOTE — Telephone Encounter (Signed)
[facility] Pre-operative Risk Assessment     Request for surgical clearance:     Endoscopy Procedure  What type of surgery is being performed?     Colonoscopy  When is this surgery scheduled?     Monday 08/04/20  What type of clearance is required ?   Pharmacy  Are there any medications that need to be held prior to surgery and how long? Xarelto 24 hours  Practice name and name of physician performing surgery?      [facility]  What is your office phone and fax number?      Phone- [phone]  [phone]  Anesthesia type (None, local, MAC, general) ?       MAC

## 2020-06-12 ENCOUNTER — Other Ambulatory Visit: Payer: Self-pay | Admitting: Cardiovascular Disease

## 2020-06-19 NOTE — Telephone Encounter (Signed)
Left message for patient to call office.  

## 2020-06-20 NOTE — Telephone Encounter (Signed)
Informed patient he can hold his Xarelto 24 hours prior to his procedure per Cardiology. Patient verbalized understanding.

## 2020-06-20 NOTE — Telephone Encounter (Signed)
Left message for patient to return my call.

## 2020-06-20 NOTE — Telephone Encounter (Signed)
Patient returned your call, please call patient one more time.   

## 2020-06-20 NOTE — Telephone Encounter (Signed)
Inbound call from patient returning your call. 

## 2020-07-07 ENCOUNTER — Encounter: Payer: Self-pay | Admitting: Gastroenterology

## 2020-07-21 ENCOUNTER — Other Ambulatory Visit: Payer: Self-pay | Admitting: Internal Medicine

## 2020-08-04 ENCOUNTER — Other Ambulatory Visit: Payer: Self-pay | Admitting: Gastroenterology

## 2020-08-04 ENCOUNTER — Other Ambulatory Visit: Payer: Self-pay

## 2020-08-04 ENCOUNTER — Ambulatory Visit (AMBULATORY_SURGERY_CENTER): Payer: 59 | Admitting: Gastroenterology

## 2020-08-04 ENCOUNTER — Encounter: Payer: Self-pay | Admitting: Gastroenterology

## 2020-08-04 VITALS — BP 131/80 | HR 65 | Temp 97.3°F | Resp 16 | Ht 74.0 in | Wt 220.0 lb

## 2020-08-04 DIAGNOSIS — D122 Benign neoplasm of ascending colon: Secondary | ICD-10-CM

## 2020-08-04 DIAGNOSIS — Z1211 Encounter for screening for malignant neoplasm of colon: Secondary | ICD-10-CM | POA: Diagnosis not present

## 2020-08-04 DIAGNOSIS — K635 Polyp of colon: Secondary | ICD-10-CM

## 2020-08-04 MED ORDER — SODIUM CHLORIDE 0.9 % IV SOLN: 500.0000 mL | Freq: Once | INTRAVENOUS | Status: DC

## 2020-08-04 NOTE — Op Note (Addendum)
[place] Patient Name: Carlos Harrison Procedure Date: 08/04/2020 1:29 PM MRN: [id number] Endoscopist: Thornton Park MD, MD Age: 60 Referring MD:  Date of Birth: 05/19/1960 Gender: Male Account #: [id number] Procedure:                Colonoscopy Indications:              Screening for colorectal malignant neoplasm, This                            is the patient's first colonoscopy                           No known family history of colon cancer or polyps Medicines:                Monitored Anesthesia Care Procedure:                Pre-Anesthesia Assessment:                           - Prior to the procedure, a History and Physical                            was performed, and patient medications and                            allergies were reviewed. The patient's tolerance of                            previous anesthesia was also reviewed. The risks                            and benefits of the procedure and the sedation                            options and risks were discussed with the patient.                            All questions were answered, and informed consent                            was obtained. Prior Anticoagulants: The patient has                            taken Xarelto (rivaroxaban), last dose was 1 day                            prior to procedure. ASA Grade Assessment: II - A                            patient with mild systemic disease. After reviewing                            the risks and benefits, the patient was deemed in  satisfactory condition to undergo the procedure.                           After obtaining informed consent, the colonoscope                            was passed under direct vision. Throughout the                            procedure, the patient's blood pressure, pulse, and                            oxygen saturations were monitored continuously. The                            Olympus  [id number]-[id number]) Colonoscope was                            introduced through the anus and advanced to the the                            cecum, identified by appendiceal orifice and                            ileocecal valve. A second forward view of the right                            colon was performed. The colonoscopy was performed                            without difficulty. The patient tolerated the                            procedure well. The quality of the bowel                            preparation was good. The terminal ileum, ileocecal                            valve, appendiceal orifice, and rectum were                            photographed. Scope In: 1:35:33 PM Scope Out: 1:48:19 PM Scope Withdrawal Time: 0 hours 10 minutes 1 second  Total Procedure Duration: 0 hours 12 minutes 46 seconds  Findings:                 Two sessile polyps were found in the ascending                            colon. The polyps were 3 to 4 mm in size. These                            polyps were removed with a cold snare. Resection  and retrieval were complete. Estimated blood loss                            was minimal.                           Non-bleeding external and internal hemorrhoids were                            found.                           The exam was otherwise without abnormality on                            direct and retroflexion views. Complications:            No immediate complications. Estimated blood loss:                            Minimal. Estimated Blood Loss:     Estimated blood loss was minimal. Impression:               - Two 3 to 4 mm polyps in the ascending colon,                            removed with a cold snare. Resected and retrieved.                           - The examination was otherwise normal on direct                            and retroflexion views. Recommendation:           - Patient has a contact number  available for                            emergencies. The signs and symptoms of potential                            delayed complications were discussed with the                            patient. Return to normal activities tomorrow.                            Written discharge instructions were provided to the                            patient.                           - Resume previous diet.                           - Continue present medications.                           -  Resume Xarelto 08/05/20.                           - Await pathology results.                           - Repeat colonoscopy date to be determined after                            pending pathology results are reviewed for                            surveillance.                           - Emerging evidence supports eating a diet of                            fruits, vegetables, grains, calcium, and yogurt                            while reducing red meat and alcohol may reduce the                            risk of colon cancer.                           - Thank you for allowing me to be involved in your                            colon cancer prevention. Thornton Park MD, MD 08/04/2020 1:53:37 PM This report has been signed electronically.

## 2020-08-04 NOTE — Progress Notes (Signed)
pt tolerated well. VSS. awake and to recovery. Report given to RN.  

## 2020-08-04 NOTE — Progress Notes (Signed)
Called to room to assist during endoscopic procedure.  Patient ID and intended procedure confirmed with present staff. Received instructions for my participation in the procedure from the performing physician.  

## 2020-08-04 NOTE — Patient Instructions (Addendum)
Handouts provided on polyps and hemorrhoids.   Resume Xarelto tomorrow (08/05/20) at prior dose.   YOU HAD AN ENDOSCOPIC PROCEDURE TODAY AT [facility]:   Refer to the procedure report that was given to you for any specific questions about what was found during the examination.  If the procedure report does not answer your questions, please call your gastroenterologist to clarify.  If you requested that your care partner not be given the details of your procedure findings, then the procedure report has been included in a sealed envelope for you to review at your convenience later.  YOU SHOULD EXPECT: Some feelings of bloating in the abdomen. Passage of more gas than usual.  Walking can help get rid of the air that was put into your GI tract during the procedure and reduce the bloating. If you had a lower endoscopy (such as a colonoscopy or flexible sigmoidoscopy) you may notice spotting of blood in your stool or on the toilet paper. If you underwent a bowel prep for your procedure, you may not have a normal bowel movement for a few days.  Please Note:  You might notice some irritation and congestion in your nose or some drainage.  This is from the oxygen used during your procedure.  There is no need for concern and it should clear up in a day or so.  SYMPTOMS TO REPORT IMMEDIATELY:   Following lower endoscopy (colonoscopy or flexible sigmoidoscopy):  Excessive amounts of blood in the stool  Significant tenderness or worsening of abdominal pains  Swelling of the abdomen that is new, acute  Fever of 100F or higher  For urgent or emergent issues, a gastroenterologist can be reached at any hour by calling [phone]. Do not use MyChart messaging for urgent concerns.    DIET:  We do recommend a small meal at first, but then you may proceed to your regular diet.  Drink plenty of fluids but you should avoid alcoholic beverages for 24 hours.  ACTIVITY:  You should plan to take  it easy for the rest of today and you should NOT DRIVE or use heavy machinery until tomorrow (because of the sedation medicines used during the test).    FOLLOW UP: Our staff will call the number listed on your records 48-72 hours following your procedure to check on you and address any questions or concerns that you may have regarding the information given to you following your procedure. If we do not reach you, we will leave a message.  We will attempt to reach you two times.  During this call, we will ask if you have developed any symptoms of COVID 19. If you develop any symptoms (ie: fever, flu-like symptoms, shortness of breath, cough etc.) before then, please call [phone].  If you test positive for Covid 19 in the 2 weeks post procedure, please call and report this information to [place].    If any biopsies were taken you will be contacted by phone or by letter within the next 1-3 weeks.  Please call us at [phone] if you have not heard about the biopsies in 3 weeks.    SIGNATURES/CONFIDENTIALITY: You and/or your care partner have signed paperwork which will be entered into your electronic medical record.  These signatures attest to the fact that that the information above on your After Visit Summary has been reviewed and is understood.  Full responsibility of the confidentiality of this discharge information lies with you and/or your care-partner.

## 2020-08-06 ENCOUNTER — Telehealth: Payer: Self-pay

## 2020-08-06 NOTE — Telephone Encounter (Signed)
Second post procedure follow up call, no answer 

## 2020-08-06 NOTE — Telephone Encounter (Signed)
No answer, left message to call back later today, B.Rohen Kimes RN. 

## 2020-08-14 ENCOUNTER — Encounter: Payer: Self-pay | Admitting: Gastroenterology

## 2020-09-16 ENCOUNTER — Encounter: Payer: Self-pay | Admitting: Internal Medicine

## 2020-09-16 MED ORDER — ROSUVASTATIN CALCIUM 10 MG PO TABS: ORAL_TABLET | ORAL | 0 refills | Status: DC

## 2020-11-03 NOTE — Patient Instructions (Addendum)
  You are eligible for a shingles vaccine ( 2 vaccines - 2-6 months apart).  Flu like symptoms are possible.  Shingles is a reactivation of chicken pox.   You are eligible for a pneumonia vaccine.    Think about getting a covid booster.      Blood work was ordered.     Medications changes include :  none    Please followup in 6 months

## 2020-11-03 NOTE — Progress Notes (Signed)
Subjective:    Patient ID: Carlos Harrison, male    DOB: 04-Dec-1960, 60 y.o.   MRN: [id number]  HPI The patient is here for follow up of their chronic medical problems, including CMP, Aflutter, htn, hichol, prediabetes  Left thumb pain and swelling.  No injury.  Started one week ago.  The pain is near his thenar process.  No specific activities or movements make it worse.  He feels like he is not able to crack the thumb and is easily able to.  He denies any other finger joint pain.  Medications and allergies reviewed with patient and updated if appropriate.  Patient Active Problem List   Diagnosis Date Noted   Chronic anticoagulation 06/05/2020   Anemia 05/06/2020   Paronychia of right index finger 01/17/2020   Neck pain 04/26/2018   Dilated cardiomyopathy ([place]) 04/26/2018   Atrial flutter, paroxysmal ([place]) 10/26/2017   Prediabetes 04/27/2017   Hypertension 02/21/2017   Hyperlipidemia 02/21/2017   Special screening for malignant neoplasms, colon 05/29/2015   Sacroiliac joint dysfunction of right side 07/30/2014    Current Outpatient Medications on File Prior to Visit  Medication Sig Dispense Refill   metoprolol succinate (TOPROL-XL) 50 MG 24 hr tablet TAKE 1 TABLET BY MOUTH EVERY DAY 90 tablet 3   rosuvastatin (CRESTOR) 10 MG tablet Take 1 tablet by mouth once daily Annual appt due in July must see provider for future refills 90 tablet 0   sacubitril-valsartan (ENTRESTO) 49-51 MG Take 1 tablet by mouth 2 (two) times daily. 180 tablet 3   spironolactone (ALDACTONE) 25 MG tablet Take 0.5 tablets (12.5 mg total) by mouth daily. 45 tablet 3   XARELTO 20 MG TABS tablet TAKE 1 TABLET BY MOUTH DAILY WITH SUPPER 90 tablet 0   No current facility-administered medications on file prior to visit.    Past Medical History:  Diagnosis Date   Atrial fibrillation ([place]) 10/2017   Cardiomyopathy ([place]) 11/10/2017   Dyspnea    Hyperlipidemia    Hypertension    Palpitations 10/26/2017     Past Surgical History:  Procedure Laterality Date   CARDIOVERSION N/A 11/16/2017   Procedure: CARDIOVERSION;  Surgeon: Nahser, Wonda Cheng, MD;  Location: [facility];  Service: Cardiovascular;  Laterality: N/A;   EYE SURGERY Left    age 54 or 68   TEE WITH CARDIOVERSION  11/16/2017   TEE WITHOUT CARDIOVERSION N/A 11/16/2017   Procedure: TRANSESOPHAGEAL ECHOCARDIOGRAM (TEE);  Surgeon: Acie Fredrickson, Wonda Cheng, MD;  Location: [facility] ENDOSCOPY;  Service: Cardiovascular;  Laterality: N/A;    Social History   Socioeconomic History   Marital status: Married    Spouse name: Not on file   Number of children: 9   Years of education: 16   Highest education level: Not on file  Occupational History   Occupation: [employer]  Tobacco Use   Smoking status: Never   Smokeless tobacco: Never  Vaping Use   Vaping Use: Never used  Substance and Sexual Activity   Alcohol use: No   Drug use: No   Sexual activity: Not on file  Other Topics Concern   Not on file  Social History Narrative   ** Merged History Encounter **       Fun: Mentoring youth, solitary games.  Denies any religious beliefs effecting health care.    Social Determinants of Health   Financial Resource Strain: Not on file  Food Insecurity: Not on file  Transportation Needs: Not on file  Physical Activity: Not on  file  Stress: Not on file  Social Connections: Not on file    Family History  Problem Relation Age of Onset   Ovarian cancer Mother    Heart disease Brother    Heart disease Brother    Cirrhosis Brother    Diabetes Brother    Hypertension Son    Premature birth Daughter    Seizures Daughter     Review of Systems  Constitutional:  Negative for chills and fever.  Respiratory:  Positive for shortness of breath (with certain activites - has not gotten worse). Negative for cough and wheezing.   Cardiovascular:  Negative for chest pain, palpitations and leg swelling.  Neurological:  Negative for light-headedness  and headaches.      Objective:   Vitals:   11/04/20 1443  BP: 130/82  Pulse: 61  Temp: 98.4 F (36.9 C)  SpO2: 96%   BP Readings from Last 3 Encounters:  11/04/20 130/82  08/04/20 131/80  06/05/20 (!) 142/100   Wt Readings from Last 3 Encounters:  11/04/20 229 lb 6.4 oz (104.1 kg)  08/04/20 220 lb (99.8 kg)  06/05/20 220 lb 6 oz (100 kg)   Body mass index is 29.45 kg/m.   Physical Exam    Constitutional: Appears well-developed and well-nourished. No distress.  HENT:  Head: Normocephalic and atraumatic.  Neck: Neck supple. No tracheal deviation present. No thyromegaly present.  No cervical lymphadenopathy Cardiovascular: Normal rate, regular rhythm and normal heart sounds.   No murmur heard. No carotid bruit .  No edema Pulmonary/Chest: Effort normal and breath sounds normal. No respiratory distress. No has no wheezes. No rales. Musculoskeletal: No left thumb swelling or deformity, tenderness in the focal area on the thenar process with a small subcutaneous bump there, mild discomfort PIP joint, no other discomfort, full range of motion, no joint swelling Skin: Skin is warm and dry. Not diaphoretic.  Psychiatric: Normal mood and affect. Behavior is normal.      Assessment & Plan:    See Problem List for Assessment and Plan of chronic medical problems.    This visit occurred during the SARS-CoV-2 public health emergency.  Safety protocols were in place, including screening questions prior to the visit, additional usage of staff PPE, and extensive cleaning of exam room while observing appropriate contact time as indicated for disinfecting solutions.

## 2020-11-04 ENCOUNTER — Ambulatory Visit (INDEPENDENT_AMBULATORY_CARE_PROVIDER_SITE_OTHER): Payer: 59 | Admitting: Internal Medicine

## 2020-11-04 ENCOUNTER — Encounter: Payer: Self-pay | Admitting: Internal Medicine

## 2020-11-04 ENCOUNTER — Other Ambulatory Visit: Payer: Self-pay

## 2020-11-04 VITALS — BP 130/82 | HR 61 | Temp 98.4°F | Ht 74.0 in | Wt 229.4 lb

## 2020-11-04 DIAGNOSIS — D649 Anemia, unspecified: Secondary | ICD-10-CM | POA: Diagnosis not present

## 2020-11-04 DIAGNOSIS — R7303 Prediabetes: Secondary | ICD-10-CM

## 2020-11-04 DIAGNOSIS — E7849 Other hyperlipidemia: Secondary | ICD-10-CM

## 2020-11-04 DIAGNOSIS — I10 Essential (primary) hypertension: Secondary | ICD-10-CM

## 2020-11-04 DIAGNOSIS — M79645 Pain in left finger(s): Secondary | ICD-10-CM | POA: Insufficient documentation

## 2020-11-04 DIAGNOSIS — I4892 Unspecified atrial flutter: Secondary | ICD-10-CM

## 2020-11-04 LAB — COMPREHENSIVE METABOLIC PANEL
ALT: 12 U/L (ref 0–53)
AST: 19 U/L (ref 0–37)
Albumin: 4.1 g/dL (ref 3.5–5.2)
Alkaline Phosphatase: 48 U/L (ref 39–117)
BUN: 12 mg/dL (ref 6–23)
CO2: 27 mEq/L (ref 19–32)
Calcium: 9 mg/dL (ref 8.4–10.5)
Chloride: 105 mEq/L (ref 96–112)
Creatinine, Ser: 1.02 mg/dL (ref 0.40–1.50)
GFR: 81.19 mL/min (ref >60.00)
Glucose, Bld: 98 mg/dL (ref 70–99)
Potassium: 4.1 mEq/L (ref 3.5–5.1)
Sodium: 139 mEq/L (ref 135–145)
Total Bilirubin: 0.5 mg/dL (ref 0.2–1.2)
Total Protein: 7.3 g/dL (ref 6.0–8.3)

## 2020-11-04 LAB — CBC WITH DIFFERENTIAL/PLATELET
Basophils Absolute: 0 10*3/uL (ref 0.0–0.1)
Basophils Relative: 0.5 % (ref 0.0–3.0)
Eosinophils Absolute: 0.2 10*3/uL (ref 0.0–0.7)
Eosinophils Relative: 4.9 % (ref 0.0–5.0)
HCT: 35.1 % — ABNORMAL LOW (ref 39.0–52.0)
Hemoglobin: 11.7 g/dL — ABNORMAL LOW (ref 13.0–17.0)
Lymphocytes Relative: 47.7 % — ABNORMAL HIGH (ref 12.0–46.0)
Lymphs Abs: 1.7 10*3/uL (ref 0.7–4.0)
MCHC: 33.4 g/dL (ref 30.0–36.0)
MCV: 86.3 fl (ref 78.0–100.0)
Monocytes Absolute: 0.3 10*3/uL (ref 0.1–1.0)
Monocytes Relative: 9 % (ref 3.0–12.0)
Neutro Abs: 1.3 10*3/uL — ABNORMAL LOW (ref 1.4–7.7)
Neutrophils Relative %: 37.9 % — ABNORMAL LOW (ref 43.0–77.0)
Platelets: 177 10*3/uL (ref 150.0–400.0)
RBC: 4.06 Mil/uL — ABNORMAL LOW (ref 4.22–5.81)
RDW: 14.4 % (ref 11.5–15.5)
WBC: 3.5 10*3/uL — ABNORMAL LOW (ref 4.0–10.5)

## 2020-11-04 LAB — LIPID PANEL
Cholesterol: 158 mg/dL (ref 0–200)
HDL: 43 mg/dL (ref >39.00)
LDL Cholesterol: 80 mg/dL (ref 0–99)
NonHDL: 115.13
Total CHOL/HDL Ratio: 4
Triglycerides: 178 mg/dL — ABNORMAL HIGH (ref 0.0–149.0)
VLDL: 35.6 mg/dL (ref 0.0–40.0)

## 2020-11-04 LAB — IRON,TIBC AND FERRITIN PANEL
%SAT: 16 % (calc) — ABNORMAL LOW (ref 20–48)
Ferritin: 17 ng/mL — ABNORMAL LOW (ref 38–380)
Iron: 47 ug/dL — ABNORMAL LOW (ref 50–180)
TIBC: 301 mcg/dL (calc) (ref 250–425)

## 2020-11-04 LAB — HEMOGLOBIN A1C: Hgb A1c MFr Bld: 6.3 % (ref 4.6–6.5)

## 2020-11-04 NOTE — Assessment & Plan Note (Signed)
Chronic Asymptomatic Rate controlled Following with cardiology Continue metoprolol XL 50 mg daily, Xarelto 20 mg daily CMP, CBC

## 2020-11-04 NOTE — Assessment & Plan Note (Signed)
Chronic Blood pressure well controlled Continue metoprolol XL 50 mg daily, Entresto 49-51 mg twice daily, spironolactone 12.5 mg daily CMP

## 2020-11-04 NOTE — Assessment & Plan Note (Signed)
Acute Thumb pain x1 week on left hand No injury Does not seem to be arthritic Possible tendinitis or ligament injury-hard to tell Discussed referral to sports medicine-he will monitor for now and let me know if his pain does not improve and I can refer him at that time

## 2020-11-04 NOTE — Assessment & Plan Note (Signed)
Chronic Check a1c Low sugar / carb diet Stressed regular exercise  

## 2020-11-04 NOTE — Assessment & Plan Note (Signed)
Chronic Iron deficiency Mild He is on Xarelto 20 mg daily Check CBC, iron profile

## 2020-11-04 NOTE — Assessment & Plan Note (Signed)
Chronic Check lipid panel  Continue Crestor 10 mg daily Regular exercise and healthy diet encouraged

## 2020-11-05 ENCOUNTER — Other Ambulatory Visit: Payer: Self-pay | Admitting: Internal Medicine

## 2020-11-05 DIAGNOSIS — D509 Iron deficiency anemia, unspecified: Secondary | ICD-10-CM

## 2020-11-06 ENCOUNTER — Other Ambulatory Visit: Payer: Self-pay | Admitting: Internal Medicine

## 2020-11-07 ENCOUNTER — Encounter: Payer: Self-pay | Admitting: Internal Medicine

## 2020-11-07 DIAGNOSIS — M79645 Pain in left finger(s): Secondary | ICD-10-CM

## 2020-11-11 NOTE — Progress Notes (Signed)
I, Peterson Lombard, LAT, ATC acting as a scribe for Lynne Leader, MD.  Subjective:    I'm seeing this patient as a consultation for: Dr. Billey Gosling. Note will be routed back to referring provider/PCP.  CC: Left thumb pain  HPI: Pt is a 59 y/o male c/o L thumb pain ongoing since the beginning of Aug, w/ no known MOI. Pt locates pain to 1st MCP joint and into thenar eminence. Pt notes that sometimes thumb will get "stuck" in certain positions. Pt is R-hand dominate.  Grip strength: weakened L thumb swellling: yes Aggravates: pinching or gripping positions Treatments tried: none  Past medical history, Surgical history, Family history, Social history, Allergies, and medications have been entered into the medical record, reviewed.   Review of Systems: No new headache, visual changes, nausea, vomiting, diarrhea, constipation, dizziness, abdominal pain, skin rash, fevers, chills, night sweats, weight loss, swollen lymph nodes, body aches, joint swelling, muscle aches, chest pain, shortness of breath, mood changes, visual or auditory hallucinations.   Objective:    Vitals:   11/12/20 1355  BP: 118/82  Pulse: (!) 54  SpO2: 98%   General: Well Developed, well nourished, and in no acute distress.  Neuro/Psych: Alert and oriented x3, extra-ocular muscles intact, able to move all 4 extremities, sensation grossly intact. Skin: Warm and dry, no rashes noted.  Respiratory: Not using accessory muscles, speaking in full sentences, trachea midline.  Cardiovascular: Pulses palpable, no extremity edema. Abdomen: Does not appear distended. MSK: Left hand normal. Tender palpation palmar first MCP.  Pain with flexion at first IP joint. Strength intact.  Hand motion is intact and otherwise nontender.  Lab and Radiology Results  Procedure: Real-time Ultrasound Guided Injection of left first MCP A1 pulley (trigger thumb injection) Device: Philips Affiniti 50G Images permanently stored and available  for review in PACS Verbal informed consent obtained.  Discussed risks and benefits of procedure. Warned about infection bleeding damage to structures skin hypopigmentation and fat atrophy among others. Patient expresses understanding and agreement Time-out conducted.   Noted no overlying erythema, induration, or other signs of local infection.   Skin prepped in a sterile fashion.   Local anesthesia: Topical Ethyl chloride.   With sterile technique and under real time ultrasound guidance: 0.5 mL of 80 mg/mL Depo-Medrol solution and 0.5 mL of lidocaine injected into left thumb A1 pulley tendon sheath. Fluid seen entering the tendon sheath.   Completed without difficulty   Pain immediately resolved suggesting accurate placement of the medication.   Advised to call if fevers/chills, erythema, induration, drainage, or persistent bleeding.   Images permanently stored and available for review in the ultrasound unit.  Impression: Technically successful ultrasound guided injection.      Impression and Recommendations:    Assessment and Plan: 60 y.o. male with left trigger thumb.  Plan for injection today, Voltaren gel, and double Band-Aid splint.  Recheck back as needed.  Precautions reviewed.Marland Kitchen  PDMP not reviewed this encounter. Orders Placed This Encounter  Procedures   [place] LIMITED JOINT SPACE STRUCTURES UP LEFT(NO LINKED CHARGES)    Standing Status:   Future    Number of Occurrences:   1    Standing Expiration Date:   05/15/2021    Order Specific Question:   Reason for Exam (SYMPTOM  OR DIAGNOSIS REQUIRED)    Answer:   left thumb pain    Order Specific Question:   Preferred imaging location?    Answer:   [facility]   No  orders of the defined types were placed in this encounter.   Discussed warning signs or symptoms. Please see discharge instructions. Patient expresses understanding.   The above documentation has been reviewed and is accurate and complete Lynne Leader, M.D.

## 2020-11-12 ENCOUNTER — Other Ambulatory Visit: Payer: Self-pay

## 2020-11-12 ENCOUNTER — Ambulatory Visit: Payer: Self-pay

## 2020-11-12 ENCOUNTER — Ambulatory Visit (INDEPENDENT_AMBULATORY_CARE_PROVIDER_SITE_OTHER): Payer: 59 | Admitting: Family Medicine

## 2020-11-12 VITALS — BP 118/82 | HR 54 | Ht 74.0 in | Wt 224.6 lb

## 2020-11-12 DIAGNOSIS — M79645 Pain in left finger(s): Secondary | ICD-10-CM | POA: Diagnosis not present

## 2020-11-12 DIAGNOSIS — M65312 Trigger thumb, left thumb: Secondary | ICD-10-CM

## 2020-11-12 NOTE — Patient Instructions (Signed)
Thank you for coming in today.   Please use Voltaren gel (Generic Diclofenac Gel) up to 4x daily for pain as needed.  This is available over-the-counter as both the name brand Voltaren gel and the generic diclofenac gel.   Call or go to the ER if you develop a large red swollen joint with extreme pain or oozing puss.    Recheck if not improving.   Trigger Finger  Trigger finger, also called stenosing tenosynovitis,  is a condition that causes a finger to get stuck in a bent position. Each finger has a tendon, which is a tough, cord-like tissue that connects muscle tobone, and each tendon passes through a tunnel of tissue called a tendon sheath. To move your finger, your tendon needs to glide freely through the sheath. Trigger finger happens when the tendon or the sheath thickens, making it difficult to move your finger. Trigger finger can affect any finger or a thumb. It may affect more than one finger. Mild cases may clear up with rest andmedicine. Severe cases require more treatment. What are the causes? Trigger finger is caused by a thickened finger tendon or tendon sheath. Thecause of this thickening is not known. What increases the risk? The following factors may make you more likely to develop this condition: Doing activities that require a strong grip. Having rheumatoid arthritis, gout, or diabetes. Being 54-64 years old. Being male. What are the signs or symptoms? Symptoms of this condition include: Pain when bending or straightening your finger. Tenderness or swelling where your finger attaches to the palm of your hand. A lump in the palm of your hand or on the inside of your finger. Hearing a noise like a pop or a snap when you try to straighten your finger. Feeling a catching or locking sensation when you try to straighten your finger. Being unable to straighten your finger. How is this diagnosed? This condition is diagnosed based on your symptoms and a physical exam. How  is this treated? This condition may be treated by: Resting your finger and avoiding activities that make symptoms worse. Wearing a finger splint to keep your finger extended. Taking NSAIDs, such as ibuprofen, to relieve pain and swelling. Doing gentle exercises to stretch the finger as told by your health care provider. Having medicine that reduces swelling and inflammation (steroids) injected into the tendon sheath. Injections may need to be repeated. Having surgery to open the tendon sheath. This may be done if other treatments do not work and you cannot straighten your finger. You may need physical therapy after surgery. Follow these instructions at home: If you have a splint: Wear the splint as told by your health care provider. Remove it only as told by your health care provider. Loosen it if your fingers tingle, become numb, or turn cold and blue. Keep it clean. If the splint is not waterproof: Do not let it get wet. Cover it with a watertight covering when you take a bath or shower. Managing pain, stiffness, and swelling     If directed, apply heat to the affected area as often as told by your health care provider. Use the heat source that your health care provider recommends, such as a moist heat pack or a heating pad. Place a towel between your skin and the heat source. Leave the heat on for 20-30 minutes. Remove the heat if your skin turns bright red. This is especially important if you are unable to feel pain, heat, or cold. You may have  a greater risk of getting burned. If directed, put ice on the painful area. To do this: If you have a removable splint, remove it as told by your health care provider. Put ice in a plastic bag. Place a towel between your skin and the bag or between your splint and the bag. Leave the ice on for 20 minutes, 2-3 times a day.  Activity Rest your finger as told by your health care provider. Avoid activities that make the pain worse. Return to  your normal activities as told by your health care provider. Ask your health care provider what activities are safe for you. Do exercises as told by your health care provider. Ask your health care provider when it is safe to drive if you have a splint on your hand. General instructions Take over-the-counter and prescription medicines only as told by your health care provider. Keep all follow-up visits as told by your health care provider. This is important. Contact a health care provider if: Your symptoms are not improving with home care. Summary Trigger finger, also called stenosing tenosynovitis, causes your finger to get stuck in a bent position. This can make it difficult and painful to straighten your finger. This condition develops when a finger tendon or tendon sheath thickens. Treatment may include resting your finger, wearing a splint, and taking medicines. In severe cases, surgery to open the tendon sheath may be needed. This information is not intended to replace advice given to you by your health care provider. Make sure you discuss any questions you have with your healthcare provider. Document Revised: 07/31/2018 Document Reviewed: 07/31/2018 Elsevier Patient Education  [place].

## 2020-12-19 ENCOUNTER — Other Ambulatory Visit: Payer: Self-pay | Admitting: Internal Medicine

## 2020-12-19 MED ORDER — ROSUVASTATIN CALCIUM 10 MG PO TABS: ORAL_TABLET | ORAL | 0 refills | Status: DC

## 2021-01-12 ENCOUNTER — Other Ambulatory Visit: Payer: Self-pay | Admitting: Internal Medicine

## 2021-01-16 ENCOUNTER — Other Ambulatory Visit (HOSPITAL_COMMUNITY): Payer: Self-pay

## 2021-01-16 ENCOUNTER — Other Ambulatory Visit: Payer: Self-pay

## 2021-01-16 ENCOUNTER — Encounter: Payer: Self-pay | Admitting: Internal Medicine

## 2021-01-16 MED ORDER — SPIRONOLACTONE 25 MG PO TABS
12.5000 mg | ORAL_TABLET | Freq: Every day | ORAL | 3 refills | Status: DC
  Filled 2021-01-16: qty 45, 90d supply, fill #0
  Filled 2021-04-22: qty 45, 90d supply, fill #1
  Filled 2021-07-15: qty 45, 90d supply, fill #2
  Filled 2021-10-06: qty 45, 90d supply, fill #3

## 2021-01-20 ENCOUNTER — Encounter: Payer: Self-pay | Admitting: Internal Medicine

## 2021-01-21 ENCOUNTER — Other Ambulatory Visit (HOSPITAL_COMMUNITY): Payer: Self-pay

## 2021-01-21 ENCOUNTER — Other Ambulatory Visit: Payer: Self-pay

## 2021-01-21 MED ORDER — METOPROLOL SUCCINATE ER 50 MG PO TB24
50.0000 mg | ORAL_TABLET | Freq: Every day | ORAL | 3 refills | Status: DC
  Filled 2021-01-21: qty 90, 90d supply, fill #0
  Filled 2021-05-04: qty 90, 90d supply, fill #1

## 2021-01-26 ENCOUNTER — Other Ambulatory Visit (HOSPITAL_COMMUNITY): Payer: Self-pay

## 2021-02-06 ENCOUNTER — Other Ambulatory Visit (HOSPITAL_COMMUNITY): Payer: Self-pay

## 2021-02-06 ENCOUNTER — Other Ambulatory Visit: Payer: Self-pay

## 2021-02-06 MED ORDER — ENTRESTO 49-51 MG PO TABS
1.0000 | ORAL_TABLET | Freq: Two times a day (BID) | ORAL | 2 refills | Status: DC
  Filled 2021-02-06: qty 60, 30d supply, fill #0
  Filled 2021-03-06: qty 60, 30d supply, fill #1
  Filled 2021-04-06: qty 60, 30d supply, fill #2

## 2021-02-06 NOTE — Telephone Encounter (Signed)
Pt's medication was sent to pt's pharmacy as requested. Confirmation received.  °

## 2021-03-06 ENCOUNTER — Encounter: Payer: Self-pay | Admitting: Internal Medicine

## 2021-03-06 ENCOUNTER — Other Ambulatory Visit (HOSPITAL_COMMUNITY): Payer: Self-pay

## 2021-03-06 MED ORDER — RIVAROXABAN 20 MG PO TABS
20.0000 mg | ORAL_TABLET | Freq: Every day | ORAL | 0 refills | Status: DC
  Filled 2021-03-06: qty 90, 90d supply, fill #0

## 2021-04-06 ENCOUNTER — Other Ambulatory Visit (HOSPITAL_COMMUNITY): Payer: Self-pay

## 2021-04-20 ENCOUNTER — Encounter: Payer: Self-pay | Admitting: Cardiovascular Disease

## 2021-04-20 ENCOUNTER — Encounter: Payer: Self-pay | Admitting: Internal Medicine

## 2021-04-20 ENCOUNTER — Other Ambulatory Visit (HOSPITAL_COMMUNITY): Payer: Self-pay

## 2021-04-20 ENCOUNTER — Other Ambulatory Visit: Payer: Self-pay

## 2021-04-20 MED ORDER — ROSUVASTATIN CALCIUM 10 MG PO TABS
10.0000 mg | ORAL_TABLET | Freq: Every day | ORAL | 0 refills | Status: DC
  Filled 2021-04-20: qty 90, 90d supply, fill #0

## 2021-04-22 ENCOUNTER — Other Ambulatory Visit (HOSPITAL_COMMUNITY): Payer: Self-pay

## 2021-05-04 ENCOUNTER — Other Ambulatory Visit (HOSPITAL_COMMUNITY): Payer: Self-pay

## 2021-05-04 ENCOUNTER — Other Ambulatory Visit: Payer: Self-pay | Admitting: Cardiovascular Disease

## 2021-05-04 MED ORDER — ENTRESTO 49-51 MG PO TABS
1.0000 | ORAL_TABLET | Freq: Two times a day (BID) | ORAL | 0 refills | Status: DC
  Filled 2021-05-04: qty 60, 30d supply, fill #0

## 2021-05-11 NOTE — Progress Notes (Signed)
Subjective:    Patient ID: Carlos Harrison, male    DOB: 07-25-1960, 61 y.o.   MRN: [id number]   This visit occurred during the SARS-CoV-2 public health emergency.  Safety protocols were in place, including screening questions prior to the visit, additional usage of staff PPE, and extensive cleaning of exam room while observing appropriate contact time as indicated for disinfecting solutions.   HPI He is here for a physical exam.   He is experiencing ED and would like to consider treatment.  He is eating ok - he states he could do better.   Medications and allergies reviewed with patient and updated if appropriate.  Patient Active Problem List   Diagnosis Date Noted   Trigger thumb, left thumb 11/12/2020   Thumb pain, left 11/04/2020   Chronic anticoagulation 06/05/2020   Anemia 05/06/2020   Paronychia of right index finger 01/17/2020   Neck pain 04/26/2018   Dilated cardiomyopathy ([place]) 04/26/2018   Atrial flutter, paroxysmal ([place]) 10/26/2017   Prediabetes 04/27/2017   Hypertension 02/21/2017   Hyperlipidemia 02/21/2017   Sacroiliac joint dysfunction of right side 07/30/2014    Current Outpatient Medications on File Prior to Visit  Medication Sig Dispense Refill   metoprolol succinate (TOPROL-XL) 50 MG 24 hr tablet Take 1 tablet (50 mg total) by mouth daily. Take with or immediately following a meal. 90 tablet 3   rivaroxaban (XARELTO) 20 MG TABS tablet Take 1 tablet (20 mg total) by mouth daily with supper. Please keep appt in February for future refills 90 tablet 0   rosuvastatin (CRESTOR) 10 MG tablet Take 1 tablet (10 mg total) by mouth daily. Annual appt due in July must see provider for future refills. 90 tablet 0   sacubitril-valsartan (ENTRESTO) 49-51 MG Take 1 tablet by mouth 2 (two) times daily. 60 tablet 0   spironolactone (ALDACTONE) 25 MG tablet Take 0.5 tablets (12.5 mg total) by mouth daily. 45 tablet 3   No current facility-administered medications on  file prior to visit.    Past Medical History:  Diagnosis Date   Atrial fibrillation ([place]) 10/2017   Cardiomyopathy ([place]) 11/10/2017   Dyspnea    Hyperlipidemia    Hypertension    Palpitations 10/26/2017    Past Surgical History:  Procedure Laterality Date   CARDIOVERSION N/A 11/16/2017   Procedure: CARDIOVERSION;  Surgeon: Nahser, Wonda Cheng, MD;  Location: [facility];  Service: Cardiovascular;  Laterality: N/A;   EYE SURGERY Left    age 38 or 69   TEE WITH CARDIOVERSION  11/16/2017   TEE WITHOUT CARDIOVERSION N/A 11/16/2017   Procedure: TRANSESOPHAGEAL ECHOCARDIOGRAM (TEE);  Surgeon: Acie Fredrickson, Wonda Cheng, MD;  Location: [facility] ENDOSCOPY;  Service: Cardiovascular;  Laterality: N/A;    Social History   Socioeconomic History   Marital status: Married    Spouse name: Not on file   Number of children: 9   Years of education: 16   Highest education level: Not on file  Occupational History   Occupation: [employer]  Tobacco Use   Smoking status: Never   Smokeless tobacco: Never  Vaping Use   Vaping Use: Never used  Substance and Sexual Activity   Alcohol use: No   Drug use: No   Sexual activity: Not on file  Other Topics Concern   Not on file  Social History Narrative   ** Merged History Encounter **       Fun: Mentoring youth, solitary games.  Denies any religious beliefs effecting health care.  Social Determinants of Health   Financial Resource Strain: Not on file  Food Insecurity: Not on file  Transportation Needs: Not on file  Physical Activity: Not on file  Stress: Not on file  Social Connections: Not on file    Family History  Problem Relation Age of Onset   Ovarian cancer Mother    Heart disease Brother    Heart disease Brother    Cirrhosis Brother    Diabetes Brother    Hypertension Son    Premature birth Daughter    Seizures Daughter     Review of Systems  Constitutional:  Negative for chills and fever.  Eyes:  Negative for visual disturbance.   Respiratory:  Negative for cough, shortness of breath and wheezing.   Cardiovascular:  Negative for chest pain and palpitations.  Gastrointestinal:  Positive for anal bleeding. Negative for abdominal pain, blood in stool, constipation, diarrhea and nausea.       No gerd  Genitourinary:  Negative for difficulty urinating, dysuria and hematuria.  Musculoskeletal:  Positive for back pain. Negative for arthralgias.  Skin:  Negative for rash.  Neurological:  Negative for dizziness, light-headedness and headaches.  Psychiatric/Behavioral:  Negative for dysphoric mood. The patient is not nervous/anxious.       Objective:   Vitals:   05/13/21 1406  BP: 130/78  Pulse: 79  Temp: 98.2 F (36.8 C)  SpO2: 99%   Filed Weights   05/13/21 1406  Weight: 227 lb (103 kg)   Body mass index is 29.15 kg/m.  BP Readings from Last 3 Encounters:  05/13/21 130/78  11/12/20 118/82  11/04/20 130/82    Wt Readings from Last 3 Encounters:  05/13/21 227 lb (103 kg)  11/12/20 224 lb 9.6 oz (101.9 kg)  11/04/20 229 lb 6.4 oz (104.1 kg)     Physical Exam Constitutional: He appears well-developed and well-nourished. No distress.  HENT:  Head: Normocephalic and atraumatic.  Right Ear: External ear normal.  Left Ear: External ear normal.  Mouth/Throat: Oropharynx is clear and moist.  Normal ear canals and TM b/l  Eyes: Conjunctivae and EOM are normal.  Neck: Neck supple. No tracheal deviation present. No thyromegaly present.  No carotid bruit  Cardiovascular: Normal rate, regular rhythm, normal heart sounds and intact distal pulses.   No murmur heard. Pulmonary/Chest: Effort normal and breath sounds normal. No respiratory distress. He has no wheezes. He has no rales.  Abdominal: Soft. He exhibits no distension. There is no tenderness.  Genitourinary: deferred  Musculoskeletal: He exhibits no edema.  Lymphadenopathy:   He has no cervical adenopathy.  Skin: Skin is warm and dry. He is not  diaphoretic.  Psychiatric: He has a normal mood and affect. His behavior is normal.         Assessment & Plan:   Physical exam: Screening blood work  ordered Exercise   active at work Weight  ok for age Substance abuse   none   Reviewed recommended immunizations.   Health Maintenance  Topic Date Due   COVID-19 Vaccine (1) Never done   Zoster Vaccines- Shingrix (1 of 2) Never done   INFLUENZA VACCINE  10/27/2020   COLONOSCOPY (Pts 45-58yrs Insurance coverage will need to be confirmed)  08/04/2025   TETANUS/TDAP  12/31/2029   Hepatitis C Screening  Completed   HIV Screening  Completed   HPV VACCINES  Aged Out     See Problem List for Assessment and Plan of chronic medical problems.

## 2021-05-11 NOTE — Patient Instructions (Addendum)
Blood work was ordered.     Medications changes include :  cialis 10-20 mg daily as needed.  Hydrocortisone cream as needed.    Your prescription(s) have been submitted to your pharmacy. Please take as directed and contact our office if you believe you are having problem(s) with the medication(s).     Please followup in 6 months    Health Maintenance, Male Adopting a healthy lifestyle and getting preventive care are important in promoting health and wellness. Ask your health care provider about: The right schedule for you to have regular tests and exams. Things you can do on your own to prevent diseases and keep yourself healthy. What should I know about diet, weight, and exercise? Eat a healthy diet  Eat a diet that includes plenty of vegetables, fruits, low-fat dairy products, and lean protein. Do not eat a lot of foods that are high in solid fats, added sugars, or sodium. Maintain a healthy weight Body mass index (BMI) is a measurement that can be used to identify possible weight problems. It estimates body fat based on height and weight. Your health care provider can help determine your BMI and help you achieve or maintain a healthy weight. Get regular exercise Get regular exercise. This is one of the most important things you can do for your health. Most adults should: Exercise for at least 150 minutes each week. The exercise should increase your heart rate and make you sweat (moderate-intensity exercise). Do strengthening exercises at least twice a week. This is in addition to the moderate-intensity exercise. Spend less time sitting. Even light physical activity can be beneficial. Watch cholesterol and blood lipids Have your blood tested for lipids and cholesterol at 59 years of age, then have this test every 5 years. You may need to have your cholesterol levels checked more often if: Your lipid or cholesterol levels are high. You are older than 59 years of age. You are at  high risk for heart disease. What should I know about cancer screening? Many types of cancers can be detected early and may often be prevented. Depending on your health history and family history, you may need to have cancer screening at various ages. This may include screening for: Colorectal cancer. Prostate cancer. Skin cancer. Lung cancer. What should I know about heart disease, diabetes, and high blood pressure? Blood pressure and heart disease High blood pressure causes heart disease and increases the risk of stroke. This is more likely to develop in people who have high blood pressure readings or are overweight. Talk with your health care provider about your target blood pressure readings. Have your blood pressure checked: Every 3-5 years if you are 55-10 years of age. Every year if you are 33 years old or older. If you are between the ages of 59 and 40 and are a current or former smoker, ask your health care provider if you should have a one-time screening for abdominal aortic aneurysm (AAA). Diabetes Have regular diabetes screenings. This checks your fasting blood sugar level. Have the screening done: Once every three years after age 65 if you are at a normal weight and have a low risk for diabetes. More often and at a younger age if you are overweight or have a high risk for diabetes. What should I know about preventing infection? Hepatitis B If you have a higher risk for hepatitis B, you should be screened for this virus. Talk with your health care provider to find out if you are at  risk for hepatitis B infection. Hepatitis C Blood testing is recommended for: Everyone born from [birth year] through 1963. Anyone with known risk factors for hepatitis C. Sexually transmitted infections (STIs) You should be screened each year for STIs, including gonorrhea and chlamydia, if: You are sexually active and are younger than 59 years of age. You are older than 59 years of age and your health  care provider tells you that you are at risk for this type of infection. Your sexual activity has changed since you were last screened, and you are at increased risk for chlamydia or gonorrhea. Ask your health care provider if you are at risk. Ask your health care provider about whether you are at high risk for HIV. Your health care provider may recommend a prescription medicine to help prevent HIV infection. If you choose to take medicine to prevent HIV, you should first get tested for HIV. You should then be tested every 3 months for as long as you are taking the medicine. Follow these instructions at home: Alcohol use Do not drink alcohol if your health care provider tells you not to drink. If you drink alcohol: Limit how much you have to 0-2 drinks a day. Know how much alcohol is in your drink. In the [place] one drink equals one 12 oz bottle of beer (355 mL), one 5 oz glass of wine (148 mL), or one 1 oz glass of hard liquor (44 mL). Lifestyle Do not use any products that contain nicotine or tobacco. These products include cigarettes, chewing tobacco, and vaping devices, such as e-cigarettes. If you need help quitting, ask your health care provider. Do not use street drugs. Do not share needles. Ask your health care provider for help if you need support or information about quitting drugs. General instructions Schedule regular health, dental, and eye exams. Stay current with your vaccines. Tell your health care provider if: You often feel depressed. You have ever been abused or do not feel safe at home. Summary Adopting a healthy lifestyle and getting preventive care are important in promoting health and wellness. Follow your health care provider's instructions about healthy diet, exercising, and getting tested or screened for diseases. Follow your health care provider's instructions on monitoring your cholesterol and blood pressure. This information is not intended to replace advice given  to you by your health care provider. Make sure you discuss any questions you have with your health care provider. Document Revised: 08/04/2020 Document Reviewed: 08/04/2020 Elsevier Patient Education  [place].

## 2021-05-13 ENCOUNTER — Encounter: Payer: Self-pay | Admitting: Internal Medicine

## 2021-05-13 ENCOUNTER — Other Ambulatory Visit: Payer: Self-pay

## 2021-05-13 ENCOUNTER — Other Ambulatory Visit (HOSPITAL_COMMUNITY): Payer: Self-pay

## 2021-05-13 ENCOUNTER — Ambulatory Visit (INDEPENDENT_AMBULATORY_CARE_PROVIDER_SITE_OTHER): Payer: 59 | Admitting: Internal Medicine

## 2021-05-13 VITALS — BP 130/78 | HR 79 | Temp 98.2°F | Ht 74.0 in | Wt 227.0 lb

## 2021-05-13 DIAGNOSIS — E7849 Other hyperlipidemia: Secondary | ICD-10-CM

## 2021-05-13 DIAGNOSIS — Z125 Encounter for screening for malignant neoplasm of prostate: Secondary | ICD-10-CM | POA: Diagnosis not present

## 2021-05-13 DIAGNOSIS — I4892 Unspecified atrial flutter: Secondary | ICD-10-CM | POA: Diagnosis not present

## 2021-05-13 DIAGNOSIS — K649 Unspecified hemorrhoids: Secondary | ICD-10-CM | POA: Insufficient documentation

## 2021-05-13 DIAGNOSIS — I420 Dilated cardiomyopathy: Secondary | ICD-10-CM | POA: Diagnosis not present

## 2021-05-13 DIAGNOSIS — R7303 Prediabetes: Secondary | ICD-10-CM

## 2021-05-13 DIAGNOSIS — D649 Anemia, unspecified: Secondary | ICD-10-CM

## 2021-05-13 DIAGNOSIS — I10 Essential (primary) hypertension: Secondary | ICD-10-CM | POA: Diagnosis not present

## 2021-05-13 DIAGNOSIS — N529 Male erectile dysfunction, unspecified: Secondary | ICD-10-CM | POA: Diagnosis not present

## 2021-05-13 DIAGNOSIS — Z0000 Encounter for general adult medical examination without abnormal findings: Secondary | ICD-10-CM

## 2021-05-13 LAB — CBC WITH DIFFERENTIAL/PLATELET
Basophils Absolute: 0 10*3/uL (ref 0.0–0.1)
Basophils Relative: 0.4 % (ref 0.0–3.0)
Eosinophils Absolute: 0.2 10*3/uL (ref 0.0–0.7)
Eosinophils Relative: 5.6 % — ABNORMAL HIGH (ref 0.0–5.0)
HCT: 38.8 % — ABNORMAL LOW (ref 39.0–52.0)
Hemoglobin: 12.7 g/dL — ABNORMAL LOW (ref 13.0–17.0)
Lymphocytes Relative: 36.8 % (ref 12.0–46.0)
Lymphs Abs: 1.6 10*3/uL (ref 0.7–4.0)
MCHC: 32.8 g/dL (ref 30.0–36.0)
MCV: 86.3 fl (ref 78.0–100.0)
Monocytes Absolute: 0.5 10*3/uL (ref 0.1–1.0)
Monocytes Relative: 11.4 % (ref 3.0–12.0)
Neutro Abs: 2 10*3/uL (ref 1.4–7.7)
Neutrophils Relative %: 45.8 % (ref 43.0–77.0)
Platelets: 193 10*3/uL (ref 150.0–400.0)
RBC: 4.49 Mil/uL (ref 4.22–5.81)
RDW: 14.2 % (ref 11.5–15.5)
WBC: 4.4 10*3/uL (ref 4.0–10.5)

## 2021-05-13 LAB — HEMOGLOBIN A1C: Hgb A1c MFr Bld: 6.5 % (ref 4.6–6.5)

## 2021-05-13 LAB — TSH: TSH: 2.68 u[IU]/mL (ref 0.35–5.50)

## 2021-05-13 LAB — FERRITIN: Ferritin: 21.6 ng/mL — ABNORMAL LOW (ref 22.0–322.0)

## 2021-05-13 LAB — PSA: PSA: 1.56 ng/mL (ref 0.10–4.00)

## 2021-05-13 MED ORDER — AMOXICILLIN-POT CLAVULANATE 875-125 MG PO TABS
1.0000 | ORAL_TABLET | Freq: Two times a day (BID) | ORAL | 0 refills | Status: DC
  Filled 2021-05-13: qty 14, 7d supply, fill #0

## 2021-05-13 MED ORDER — TADALAFIL 20 MG PO TABS
10.0000 mg | ORAL_TABLET | ORAL | 11 refills | Status: DC | PRN
  Filled 2021-05-13: qty 10, 20d supply, fill #0

## 2021-05-13 MED ORDER — HYDROCORTISONE 2.5 % EX CREA
TOPICAL_CREAM | Freq: Two times a day (BID) | CUTANEOUS | 0 refills | Status: DC
  Filled 2021-05-13: qty 30, 14d supply, fill #0

## 2021-05-13 NOTE — Assessment & Plan Note (Signed)
New Would like to try a medication No on ntg Start cialis 10-20 mg daily prn

## 2021-05-13 NOTE — Assessment & Plan Note (Signed)
Chronic No symptoms Rate controlled Sees cardio On metoprolol xl 50 mg daily and xarelto 20 mg daily Cbc, cmp, tsh

## 2021-05-13 NOTE — Assessment & Plan Note (Signed)
Chronic Check a1c Low sugar / carb diet Stressed regular exercise  

## 2021-05-13 NOTE — Assessment & Plan Note (Addendum)
Chronic °Cbc, iron panel °

## 2021-05-13 NOTE — Assessment & Plan Note (Signed)
Chronic Following with cardio On entresto, spironolactone, crestor, metoprolol xl euvolemic

## 2021-05-13 NOTE — Assessment & Plan Note (Signed)
Chronic With intermittent bleeding Discuss supp, cream, banding Would like to try cream first Hydrocortisone cream 2.5% sent to pharmacy

## 2021-05-13 NOTE — Assessment & Plan Note (Signed)
Chronic Check lipid panel  Continue crestor 10 mg daily Regular exercise and healthy diet encouraged  

## 2021-05-13 NOTE — Assessment & Plan Note (Signed)
Chronic BP well controlled Continue metoprolol xl 50 mg daily, entresto 49-51 mg bid, spironolactone 12.5 mg daily cmp

## 2021-05-14 ENCOUNTER — Encounter: Payer: Self-pay | Admitting: Internal Medicine

## 2021-05-14 LAB — COMPREHENSIVE METABOLIC PANEL
ALT: 12 U/L (ref 0–53)
AST: 17 U/L (ref 0–37)
Albumin: 4.3 g/dL (ref 3.5–5.2)
Alkaline Phosphatase: 57 U/L (ref 39–117)
BUN: 11 mg/dL (ref 6–23)
CO2: 29 mEq/L (ref 19–32)
Calcium: 9.1 mg/dL (ref 8.4–10.5)
Chloride: 104 mEq/L (ref 96–112)
Creatinine, Ser: 1.05 mg/dL (ref 0.40–1.50)
GFR: 78.13 mL/min (ref >60.00)
Glucose, Bld: 99 mg/dL (ref 70–99)
Potassium: 3.8 mEq/L (ref 3.5–5.1)
Sodium: 140 mEq/L (ref 135–145)
Total Bilirubin: 0.6 mg/dL (ref 0.2–1.2)
Total Protein: 7.8 g/dL (ref 6.0–8.3)

## 2021-05-14 LAB — LIPID PANEL
Cholesterol: 157 mg/dL (ref 0–200)
HDL: 48.3 mg/dL (ref >39.00)
LDL Cholesterol: 84 mg/dL (ref 0–99)
NonHDL: 108.57
Total CHOL/HDL Ratio: 3
Triglycerides: 124 mg/dL (ref 0.0–149.0)
VLDL: 24.8 mg/dL (ref 0.0–40.0)

## 2021-05-14 LAB — IBC PANEL
Iron: 41 ug/dL — ABNORMAL LOW (ref 42–165)
Saturation Ratios: 12.1 % — ABNORMAL LOW (ref 20.0–50.0)
TIBC: 340.2 ug/dL (ref 250.0–450.0)
Transferrin: 243 mg/dL (ref 212.0–360.0)

## 2021-05-22 NOTE — Progress Notes (Signed)
Evaluation Performed:  Follow-up visit  Date:  05/26/2021   ID:  Carlos Harrison, DOB June 15, 1960, MRN [id number]   PCP:  [name], MD  Cardiologist:  Jenkins Rouge, MD   Electrophysiologist:  None   Chief Complaint:  PAF/CHF  History of Present Illness:    61 y.o. HTN, HLD, PAF and chronic systolic CHF. 07/06/79 admitted with rapid afib/flutter. TEE/DCC 11/16/17 EF during arrhythmia 15-20%Improved since [facility]. Seen by Sherian Rein D and now on entresto 49/51 mg, aldactone 12.5 mg, and Toprol 50 mg Anticoagulation with normal dose xarelto.    Last echo done 02/15/18 reviewed EF improved to 45-50%  Cardiac CTA 09/19/18 calcium score 0 normal right dominant coronary arteries   Home with all his kids during COVID crisis not very active but no cardiac Complaints   Injured his left index finger biting nails and infected with oil. 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  Discussed going up on entresto and down on Toprol     Past Medical History:  Diagnosis Date   Atrial fibrillation ([place]) 10/2017   Cardiomyopathy ([place]) 11/10/2017   Dyspnea    Hyperlipidemia    Hypertension    Palpitations 10/26/2017   Past Surgical History:  Procedure Laterality Date   CARDIOVERSION N/A 11/16/2017   Procedure: CARDIOVERSION;  Surgeon: Nahser, Wonda Cheng, MD;  Location: [place];  Service: Cardiovascular;  Laterality: N/A;   EYE SURGERY Left    age 37 or 60   TEE WITH CARDIOVERSION  11/16/2017   TEE WITHOUT CARDIOVERSION N/A 11/16/2017   Procedure: TRANSESOPHAGEAL ECHOCARDIOGRAM (TEE);  Surgeon: Acie Fredrickson Wonda Cheng, MD;  Location[facility];  Service: Cardiovascular;  Laterality: N/A;     Current Meds  Medication Sig   hydrocortisone 2.5 % cream Apply topically 2 (two) times  daily. Use for up to 14 days   metoprolol succinate (TOPROL XL) 25 MG 24 hr tablet Take 1 tablet (25 mg total) by mouth daily.   rivaroxaban (XARELTO) 20 MG TABS tablet Take 1 tablet (20 mg total) by mouth daily with supper. Please keep appt in February for future refills   rosuvastatin (CRESTOR) 10 MG tablet Take 1 tablet (10 mg total) by mouth daily. Annual appt due in July must see provider for future refills.   sacubitril-valsartan (ENTRESTO) 97-103 MG Take 1 tablet by mouth 2 (two) times daily.   spironolactone (ALDACTONE) 25 MG tablet Take 0.5 tablets (12.5 mg total) by mouth daily.   tadalafil (CIALIS) 20 MG tablet Take 0.5-1 tablets (10-20 mg total) by mouth every other day as needed for erectile dysfunction.   [DISCONTINUED] metoprolol succinate (TOPROL-XL) 50 MG 24 hr tablet Take 1 tablet (50 mg total) by mouth daily. Take with or immediately following a meal.   [DISCONTINUED] sacubitril-valsartan (ENTRESTO) 49-51 MG Take 1 tablet by mouth 2 (two) times daily.     Allergies:   Patient has no known allergies.   Social History   Tobacco Use   Smoking status: Never   Smokeless tobacco: Never  Vaping Use   Vaping Use: Never used  Substance Use Topics   Alcohol use: No   Drug use: No     Family Hx: The patient's family history includes Cirrhosis in his brother;  Diabetes in his brother; Heart disease in his brother and brother; Hypertension in his son; Ovarian cancer in his mother; Premature birth in his daughter; Seizures in his daughter.  ROS:   Please see the history of present illness.     All other systems reviewed and are negative.   Prior CV studies:   The following studies were reviewed today:  Echo November 2019  Labs/Other Tests and Data Reviewed:    EKG: SR rate 54 normal 01/23/18  05/26/2021 SR rate 63 normal 05/26/2021 NSR rate 53 normal   Recent Labs: 05/13/2021: ALT 12; BUN 11; Creatinine, Ser 1.05; Hemoglobin 12.7; Platelets 193.0; Potassium 3.8; Sodium  140; TSH 2.68   Recent Lipid Panel Lab Results  Component Value Date/Time   CHOL 157 05/13/2021 02:52 PM   TRIG 124.0 05/13/2021 02:52 PM   HDL 48.30 05/13/2021 02:52 PM   CHOLHDL 3 05/13/2021 02:52 PM   LDLCALC 84 05/13/2021 02:52 PM   LDLDIRECT 108.0 05/07/2020 11:32 AM    Wt Readings from Last 3 Encounters:  05/26/21 228 lb (103.4 kg)  05/13/21 227 lb (103 kg)  11/12/20 224 lb 9.6 oz (101.9 kg)     Objective:    Vital Signs:  BP 138/90    Pulse (!) 53    Ht 6' 3.5" (1.918 m)    Wt 228 lb (103.4 kg)    SpO2 98%    BMI 28.12 kg/m    Affect appropriate Healthy:  appears stated age HEENT: normal Neck supple with no adenopathy JVP normal no bruits no thyromegaly Lungs clear with no wheezing and good diaphragmatic motion Heart:  S1/S2 no murmur, no rub, gallop or click PMI normal Abdomen: benighn, BS positve, no tenderness, no AAA no bruit.  No HSM or HJR Distal pulses intact with no bruits No edema Neuro non-focal Skin warm and dry No muscular weakness    ASSESSMENT & PLAN:    PAF: maintaining NSR continue lower dose Toprol and anticoagulation DCM:  EF improved on goal directed Rx including enteresto EF 55-60% % by TTE 04/23/20 Cardiac CTA June 2020 with calcium score 0 and normal right dominant coronary arteries  Increase entresto  HLD:  On statin labs with primary LDL 80 05/13/21 ok given calcium score 0  COVID-19 Education: The signs and symptoms of COVID-19 were discussed with the patient and how to seek care for testing (follow up with PCP or arrange E-visit).  The importance of social distancing was discussed today.   Medication Adjustments/Labs and Tests Ordered:  Decrease lopressor to 25 mg Increase Entresto to high dose    F/U in 3 months   Signed, Jenkins Rouge, MD  05/26/2021 4:24 PM    [place]

## 2021-05-26 ENCOUNTER — Other Ambulatory Visit: Payer: Self-pay

## 2021-05-26 ENCOUNTER — Encounter: Payer: Self-pay | Admitting: Cardiovascular Disease

## 2021-05-26 ENCOUNTER — Ambulatory Visit (INDEPENDENT_AMBULATORY_CARE_PROVIDER_SITE_OTHER): Payer: 59 | Admitting: Cardiovascular Disease

## 2021-05-26 ENCOUNTER — Other Ambulatory Visit (HOSPITAL_COMMUNITY): Payer: Self-pay

## 2021-05-26 VITALS — BP 138/90 | HR 53 | Ht 75.5 in | Wt 228.0 lb

## 2021-05-26 DIAGNOSIS — I420 Dilated cardiomyopathy: Secondary | ICD-10-CM | POA: Diagnosis not present

## 2021-05-26 DIAGNOSIS — I10 Essential (primary) hypertension: Secondary | ICD-10-CM

## 2021-05-26 MED ORDER — ENTRESTO 97-103 MG PO TABS
1.0000 | ORAL_TABLET | Freq: Two times a day (BID) | ORAL | 11 refills | Status: DC
  Filled 2021-05-26: qty 60, 30d supply, fill #0
  Filled 2021-07-06: qty 60, 30d supply, fill #1
  Filled 2021-08-06: qty 60, 30d supply, fill #2
  Filled 2021-09-07: qty 60, 30d supply, fill #3
  Filled 2021-10-06: qty 60, 30d supply, fill #4
  Filled 2021-11-06: qty 60, 30d supply, fill #5
  Filled 2021-12-06: qty 60, 30d supply, fill #6
  Filled 2022-01-06: qty 60, 30d supply, fill #7
  Filled 2022-02-03: qty 60, 30d supply, fill #8
  Filled 2022-03-08: qty 60, 30d supply, fill #9
  Filled 2022-04-07: qty 60, 30d supply, fill #10
  Filled 2022-05-06: qty 60, 30d supply, fill #11

## 2021-05-26 MED ORDER — METOPROLOL SUCCINATE ER 25 MG PO TB24
25.0000 mg | ORAL_TABLET | Freq: Every day | ORAL | 3 refills | Status: DC
  Filled 2021-05-26: qty 90, 90d supply, fill #0
  Filled 2021-12-31: qty 90, 90d supply, fill #1
  Filled 2022-03-27: qty 90, 90d supply, fill #2

## 2021-05-26 NOTE — Patient Instructions (Addendum)
Medication Instructions:  Your physician has recommended you make the following change in your medication:  1-Increase Entresto 97/103 mg by mouth twice daily 2-Decrease Metoprolol 25 mg by mouth daily  *If you need a refill on your cardiac medications before your next appointment, please call your pharmacy*  Lab Work: If you have labs (blood work) drawn today and your tests are completely normal, you will receive your results only by: [place] (if you have MyChart) OR A paper copy in the mail If you have any lab test that is abnormal or we need to change your treatment, we will call you to review the results.  Testing/Procedures: None ordered today.  Follow-Up: At [facility], you and your health needs are our priority.  As part of our continuing mission to provide you with exceptional heart care, we have created designated Provider Care Teams.  These Care Teams include your primary Cardiologist (physician) and Advanced Practice Providers (APPs -  Physician Assistants and Nurse Practitioners) who all work together to provide you with the care you need, when you need it.  We recommend signing up for the patient portal called "MyChart".  Sign up information is provided on this After Visit Summary.  MyChart is used to connect with patients for Virtual Visits (Telemedicine).  Patients are able to view lab/test results, encounter notes, upcoming appointments, etc.  Non-urgent messages can be sent to your provider as well.   To learn more about what you can do with MyChart, go to [email or web address].    Your next appointment:   3 months  The format for your next appointment:   In Person  Provider:   Jenkins Rouge, MD {

## 2021-05-29 ENCOUNTER — Encounter: Payer: Self-pay | Admitting: Cardiovascular Disease

## 2021-06-03 ENCOUNTER — Telehealth: Payer: Self-pay | Admitting: *Deleted

## 2021-06-03 NOTE — Telephone Encounter (Signed)
? ?  Pre-operative Risk Assessment  ?  ?Patient Name: Carlos Harrison  ?DOB: 04-29-1960 ?MRN: [id number]  ? ? ? ?Request for Surgical Clearance   ? ?Procedure:  Dental Extraction - Amount of Teeth to be Pulled:  FULL MOUTH  WITH ALREOLOPLASTY &  FULL THICKNESS MUCOPERIOSTEA FLAPS ? ?Date of Surgery:  Clearance TBD                              ?   ?Surgeon:   ?Surgeon's Group or Practice Name:  [facility] ?Phone number:  [phone] ?Fax number:  [phone] ?  ?Type of Clearance Requested:   ?- Medical  ?- Pharmacy:  Hold Rivaroxaban (Xarelto) NOT INDICATED HOW LONG ?  ?Type of Anesthesia:  Local  ?  ?Additional requests/questions:   ? ?Signed, ?Jeanann Lewandowsky   ?06/03/2021, 12:43 PM  ? ?

## 2021-06-04 ENCOUNTER — Other Ambulatory Visit: Payer: Self-pay | Admitting: Internal Medicine

## 2021-06-04 ENCOUNTER — Other Ambulatory Visit (HOSPITAL_COMMUNITY): Payer: Self-pay

## 2021-06-04 MED ORDER — RIVAROXABAN 20 MG PO TABS
20.0000 mg | ORAL_TABLET | Freq: Every day | ORAL | 0 refills | Status: DC
  Filled 2021-06-04: qty 90, 90d supply, fill #0

## 2021-06-04 NOTE — Telephone Encounter (Signed)
Patient with diagnosis of aflutter on Xarelto for anticoagulation.   ? ?Procedure: Dental Extraction - Amount of Teeth to be Pulled: FULL MOUTH  WITH ALVEOLOPLASTY &  FULL THICKNESS MUCOPERIOSTEAL FLAPS ?Date of procedure: TBD ? ?CHA2DS2-VASc Score = 3  ?This indicates a 3.2% annual risk of stroke. ?The patient's score is based upon: ?CHF History: 1 ?HTN History: 1 ?Diabetes History: 1 ?Stroke History: 0 ?Vascular Disease History: 0 ?Age Score: 0 ?Gender Score: 0 ?  ?CrCl 68m/min using adjusted body weight ?Platelet count 193K ? ?Patient does not require pre-op antibiotics for dental procedure. ? ?Per office protocol, patient can hold Xarelto for 2-3 days prior to procedure.   ?

## 2021-06-04 NOTE — Telephone Encounter (Signed)
? ?  Primary Cardiologist: Jenkins Rouge, MD ? ?Chart reviewed as part of pre-operative protocol coverage. Given past medical history and time since last visit, based on ACC/AHA guidelines, Carlos Harrison would be at acceptable risk for the planned procedure without further cardiovascular testing.  ? ?Patient with diagnosis of aflutter on Xarelto for anticoagulation.   ?  ?Procedure: Dental Extraction - Amount of Teeth to be Pulled: FULL MOUTH  WITH ALVEOLOPLASTY &  FULL THICKNESS MUCOPERIOSTEAL FLAPS ?Date of procedure: TBD ?  ?CHA2DS2-VASc Score = 3  ?This indicates a 3.2% annual risk of stroke. ?The patient's score is based upon: ?CHF History: 1 ?HTN History: 1 ?Diabetes History: 1 ?Stroke History: 0 ?Vascular Disease History: 0 ?Age Score: 0 ?Gender Score: 0 ?  ?CrCl 27m/min using adjusted body weight ?Platelet count 193K ?  ?Patient does not require pre-op antibiotics for dental procedure. ?  ?Per office protocol, patient can hold Xarelto for 2-3 days prior to procedure.  ? ?I will route this recommendation to the requesting party via Epic fax function and remove from pre-op pool. ? ?Please call with questions. ? ?JJossie Ng Cammeron Greis NP-C ? ?  ?06/04/2021, 9:26 AM ?[place]?[id number] ?Office [phone] ([phone]? ? ? ? ?

## 2021-07-06 ENCOUNTER — Other Ambulatory Visit (HOSPITAL_COMMUNITY): Payer: Self-pay

## 2021-07-15 ENCOUNTER — Other Ambulatory Visit: Payer: Self-pay | Admitting: Internal Medicine

## 2021-07-15 ENCOUNTER — Other Ambulatory Visit (HOSPITAL_COMMUNITY): Payer: Self-pay

## 2021-07-15 MED ORDER — ROSUVASTATIN CALCIUM 10 MG PO TABS
10.0000 mg | ORAL_TABLET | Freq: Every day | ORAL | 0 refills | Status: DC
  Filled 2021-07-15: qty 90, 90d supply, fill #0

## 2021-07-22 ENCOUNTER — Encounter: Payer: Self-pay | Admitting: Internal Medicine

## 2021-07-22 NOTE — Progress Notes (Signed)
? ? ?  Subjective:  ? ? Patient ID: Carlos Harrison, male    DOB: [birth year], 61 y.o.   MRN: [id number] ? ?This visit occurred during the SARS-CoV-2 public health emergency.  Safety protocols were in place, including screening questions prior to the visit, additional usage of staff PPE, and extensive cleaning of exam room while observing appropriate contact time as indicated for disinfecting solutions. ? ? ? ?HPI ?Carlos Harrison is here for  ?Chief Complaint  ?Patient presents with  ? Joint Swelling  ?  Left thumb swelling  ? ? ?Swelling in left thumb - pain in left MCP joint, grip is weak - often does not use thumb because it is weak and hurts, it gets stuck at times and is a little swollen.  He has not tried taking anything for it.  ? ?It was injected last August by Dr Georgina Snell- it helped until recently. ? ? ? ? ?Medications and allergies reviewed with patient and updated if appropriate. ? ?Current Outpatient Medications on File Prior to Visit  ?Medication Sig Dispense Refill  ? hydrocortisone 2.5 % cream Apply topically 2 (two) times daily. Use for up to 14 days 30 g 0  ? metoprolol succinate (TOPROL XL) 25 MG 24 hr tablet Take 1 tablet (25 mg total) by mouth daily. 90 tablet 3  ? rivaroxaban (XARELTO) 20 MG TABS tablet Take 1 tablet (20 mg total) by mouth daily with supper. Please keep appt in February for future refills 90 tablet 0  ? rosuvastatin (CRESTOR) 10 MG tablet Take 1 tablet (10 mg total) by mouth daily. 90 tablet 0  ? sacubitril-valsartan (ENTRESTO) 97-103 MG Take 1 tablet by mouth 2 (two) times daily. 60 tablet 11  ? spironolactone (ALDACTONE) 25 MG tablet Take 0.5 tablets (12.5 mg total) by mouth daily. 45 tablet 3  ? tadalafil (CIALIS) 20 MG tablet Take 0.5-1 tablets (10-20 mg total) by mouth every other day as needed for erectile dysfunction. 10 tablet 11  ? ?No current facility-administered medications on file prior to visit.  ? ? ?Review of Systems ? ?   ?Objective:  ? ?Vitals:  ? 07/23/21 1003  ?BP: 120/70   ?Pulse: 60  ?Temp: 98 ?F (36.7 ?C)  ?SpO2: 99%  ? ?BP Readings from Last 3 Encounters:  ?07/23/21 120/70  ?05/26/21 138/90  ?05/13/21 130/78  ? ?Wt Readings from Last 3 Encounters:  ?07/23/21 226 lb 3.2 oz (102.6 kg)  ?05/26/21 228 lb (103.4 kg)  ?05/13/21 227 lb (103 kg)  ? ?Body mass index is 27.9 kg/m?. ? ?  ?Physical Exam ?Musculoskeletal:  ?   Comments: Mild swelling left MCP joint of thumb, mildly tender, normal sensation, no obvious weakness, good ROM ? ?Other joints in hands w/o swelling or deformity  ? ?   ? ? ? ? ? ?Assessment & Plan:  ? ? ?See Problem List for Assessment and Plan of chronic medical problems.  ? ? ? ? ?

## 2021-07-23 ENCOUNTER — Ambulatory Visit: Payer: 59 | Admitting: Internal Medicine

## 2021-07-23 VITALS — BP 120/70 | HR 60 | Temp 98.0°F | Ht 75.5 in | Wt 226.2 lb

## 2021-07-23 DIAGNOSIS — M79645 Pain in left finger(s): Secondary | ICD-10-CM | POA: Diagnosis not present

## 2021-07-23 NOTE — Assessment & Plan Note (Signed)
Chronic ?Likely OA, trigger thumb  ?Discussed treatment options ?Will try conservative treatment - tylenol, ice, voltaren gel ?If symptoms persist can see Dr Georgina Snell for another injection or see orthopedics to discuss surgery ?voltaren gel sample given - advised he can take up to 4 times a day ?

## 2021-07-23 NOTE — Patient Instructions (Addendum)
? ? ? ?  You can take tylenol, you can ice the thumb, you can apply voltaren gel (anti-inflammatory gel) that is over the counter.   ? ? ?Other options -  ?You can have another injection with Dr Georgina Snell downstairs.  ?We can have you see a hand orthopedic for consider of surgery.   ?

## 2021-08-06 ENCOUNTER — Other Ambulatory Visit (HOSPITAL_COMMUNITY): Payer: Self-pay

## 2021-08-11 ENCOUNTER — Other Ambulatory Visit: Payer: Self-pay

## 2021-08-11 ENCOUNTER — Encounter (HOSPITAL_BASED_OUTPATIENT_CLINIC_OR_DEPARTMENT_OTHER): Payer: Self-pay | Admitting: Emergency Medicine

## 2021-08-11 ENCOUNTER — Emergency Department (HOSPITAL_BASED_OUTPATIENT_CLINIC_OR_DEPARTMENT_OTHER)
Admission: EM | Admit: 2021-08-11 | Discharge: 2021-08-11 | Disposition: A | Discharge status: Home / Self Care | Payer: 59 | Attending: Emergency Medicine | Admitting: Emergency Medicine

## 2021-08-11 DIAGNOSIS — A084 Viral intestinal infection, unspecified: Secondary | ICD-10-CM | POA: Diagnosis not present

## 2021-08-11 DIAGNOSIS — R197 Diarrhea, unspecified: Secondary | ICD-10-CM | POA: Diagnosis present

## 2021-08-11 DIAGNOSIS — A0839 Other viral enteritis: Secondary | ICD-10-CM | POA: Insufficient documentation

## 2021-08-11 DIAGNOSIS — Z20822 Contact with and (suspected) exposure to covid-19: Secondary | ICD-10-CM | POA: Diagnosis not present

## 2021-08-11 DIAGNOSIS — I10 Essential (primary) hypertension: Secondary | ICD-10-CM | POA: Diagnosis not present

## 2021-08-11 LAB — CBC
HCT: 43.7 % (ref 39.0–52.0)
Hemoglobin: 14 g/dL (ref 13.0–17.0)
MCH: 28.5 pg (ref 26.0–34.0)
MCHC: 32 g/dL (ref 30.0–36.0)
MCV: 88.8 fL (ref 80.0–100.0)
Platelets: 216 10*3/uL (ref 150–400)
RBC: 4.92 MIL/uL (ref 4.22–5.81)
RDW: 14.3 % (ref 11.5–15.5)
WBC: 7.3 10*3/uL (ref 4.0–10.5)
nRBC: 0 % (ref 0.0–0.2)

## 2021-08-11 LAB — COMPREHENSIVE METABOLIC PANEL
ALT: 20 U/L (ref 0–44)
AST: 31 U/L (ref 15–41)
Albumin: 5.1 g/dL — ABNORMAL HIGH (ref 3.5–5.0)
Alkaline Phosphatase: 59 U/L (ref 38–126)
Anion gap: 10 (ref 5–15)
BUN: 12 mg/dL (ref 6–20)
CO2: 22 mmol/L (ref 22–32)
Calcium: 10 mg/dL (ref 8.9–10.3)
Chloride: 105 mmol/L (ref 98–111)
Creatinine, Ser: 1.5 mg/dL — ABNORMAL HIGH (ref 0.61–1.24)
GFR, Estimated: 54 mL/min — ABNORMAL LOW (ref >60)
Glucose, Bld: 110 mg/dL — ABNORMAL HIGH (ref 70–99)
Potassium: 4.5 mmol/L (ref 3.5–5.1)
Sodium: 137 mmol/L (ref 135–145)
Total Bilirubin: 1.2 mg/dL (ref 0.3–1.2)
Total Protein: 9.2 g/dL — ABNORMAL HIGH (ref 6.5–8.1)

## 2021-08-11 LAB — RESP PANEL BY RT-PCR (FLU A&B, COVID) ARPGX2
Influenza A by PCR: NEGATIVE
Influenza B by PCR: NEGATIVE
SARS Coronavirus 2 by RT PCR: NEGATIVE

## 2021-08-11 LAB — LIPASE, BLOOD: Lipase: 26 U/L (ref 11–51)

## 2021-08-11 MED ORDER — SODIUM CHLORIDE 0.9 % IV BOLUS
1000.0000 mL | Freq: Once | INTRAVENOUS | Status: AC
  Administered 2021-08-11: 1000 mL via INTRAVENOUS

## 2021-08-11 MED ORDER — DICYCLOMINE HCL 10 MG PO CAPS
10.0000 mg | ORAL_CAPSULE | Freq: Once | ORAL | Status: AC
  Administered 2021-08-11: 10 mg via ORAL
  Filled 2021-08-11: qty 1

## 2021-08-11 NOTE — Discharge Instructions (Addendum)
You were evaluated in the Emergency Department and after careful evaluation, we did not find any emergent condition requiring admission or further testing in the hospital. ? ?Your work-up today was overall reassuring.  You did have a slightly elevated kidney function test which is likely due to dehydration.  This should improve with appropriate IV fluids and oral rehydration.  I recommend that you continue to drink lots of fluids when you go home as tolerated.  I would follow-up with your primary care doctor in about a week to have this rechecked to make sure that it is appropriately recovered.If you continue to have diarrhea symptoms I would recommend getting tested for C. difficile.  Please advance your diet slowly and eat foods consistent with the bland diet, see handout for more information. ?Please make sure to return to the ER if you have any new or worsening symptoms.  Please return to the Emergency Department if you experience any worsening of your condition.   Thank you for allowing [place] to be a part of your care. ? ?

## 2021-08-11 NOTE — ED Triage Notes (Signed)
Dizziness, vomiting and diarrhea x 2 hours. States he felt fine earlier this morning. 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 Pt is concerned with dehydration. Denies any pain at all.  ?

## 2021-08-11 NOTE — ED Provider Notes (Signed)
?[place] EMERGENCY DEPARTMENT ?Provider Note ? ? ?CSN: [id number] ?Arrival date & time: 08/11/21  1407 ? ?  ? ?History ? ?Chief Complaint  ?Patient presents with  ? Dizziness  ? Diarrhea  ? ? ?Carlos Harrison is a 61 y.o. male. ? ?HPI ?61 year old male with a history of atrial fibrillation, hypertension, hyperlipidemia, cardiomyopathy presents to the ER with complaints of several hours of nausea, vomiting and diarrhea.  Vomitus is nonbloody nonbilious, diarrhea is watery without evidence of blood.  He denies any abdominal pain.  No fevers or chills.  Denies any recent travel, water exposure, recent antibiotics.  He does work at [employer] but does not know what he is exposed to.  He is not concerned about C. difficile. ?  ? ?Home Medications ?Prior to Admission medications   ?Medication Sig Start Date End Date Taking? Authorizing Provider  ?hydrocortisone 2.5 % cream Apply topically 2 (two) times daily. Use for up to 14 days 05/13/21   [name], MD  ?metoprolol succinate (TOPROL XL) 25 MG 24 hr tablet Take 1 tablet (25 mg total) by mouth daily. 05/26/21   Josue Hector, MD  ?rivaroxaban (XARELTO) 20 MG TABS tablet Take 1 tablet (20 mg total) by mouth daily with supper. Please keep appt in February for future refills 06/04/21   [name], MD  ?rosuvastatin (CRESTOR) 10 MG tablet Take 1 tablet (10 mg total) by mouth daily. 07/15/21   [name], MD  ?sacubitril-valsartan (ENTRESTO) 97-103 MG Take 1 tablet by mouth 2 (two) times daily. 05/26/21   Josue Hector, MD  ?spironolactone (ALDACTONE) 25 MG tablet Take 0.5 tablets (12.5 mg total) by mouth daily. 01/16/21   [name], MD  ?tadalafil (CIALIS) 20 MG tablet Take 0.5-1 tablets (10-20 mg total) by mouth every other day as needed for erectile dysfunction. 05/13/21   [name], MD  ?   ? ?Allergies    ?Patient has no known allergies.   ? ?Review of Systems   ?Review of Systems ?Ten systems reviewed and are  negative for acute change, except as noted in the HPI.  ? ?Physical Exam ?Updated Vital Signs ?BP 135/87   Pulse 63   Temp 97.7 ?F (36.5 ?C) (Oral)   Resp 18   Ht '6\' 3"'$  (1.905 m)   Wt 102.6 kg   SpO2 100%   BMI 28.27 kg/m?  ?Physical Exam ?Vitals and nursing note reviewed.  ?Constitutional:   ?   General: He is not in acute distress. ?   Appearance: He is well-developed.  ?HENT:  ?   Head: Normocephalic and atraumatic.  ?Eyes:  ?   Conjunctiva/sclera: Conjunctivae normal.  ?Cardiovascular:  ?   Rate and Rhythm: Normal rate and regular rhythm.  ?   Heart sounds: No murmur heard. ?Pulmonary:  ?   Effort: Pulmonary effort is normal. No respiratory distress.  ?   Breath sounds: Normal breath sounds.  ?Abdominal:  ?   Palpations: Abdomen is soft.  ?   Tenderness: There is no abdominal tenderness.  ?Musculoskeletal:     ?   General: No swelling.  ?   Cervical back: Neck supple.  ?Skin: ?   General: Skin is warm and dry.  ?   Capillary Refill: Capillary refill takes less than 2 seconds.  ?Neurological:  ?   Mental Status: He is alert.  ?Psychiatric:     ?   Mood and Affect: Mood normal.  ? ? ?  ED Results / Procedures / Treatments   ?Labs ?(all labs ordered are listed, but only abnormal results are displayed) ?Labs Reviewed  ?COMPREHENSIVE METABOLIC PANEL - Abnormal; Notable for the following components:  ?    Result Value  ? Glucose, Bld 110 (*)   ? Creatinine, Ser 1.50 (*)   ? Total Protein 9.2 (*)   ? Albumin 5.1 (*)   ? GFR, Estimated 54 (*)   ? All other components within normal limits  ?RESP PANEL BY RT-PCR (FLU A&B, COVID) ARPGX2  ?LIPASE, BLOOD  ?CBC  ?URINALYSIS, ROUTINE W REFLEX MICROSCOPIC  ? ? ?EKG ?EKG Interpretation ? ?Date/Time:  Tuesday Aug 11 2021 14:27:03 EDT ?Ventricular Rate:  58 ?PR Interval:  206 ?QRS Duration: 92 ?QT Interval:  391 ?QTC Calculation: 384 ?R Axis:   25 ?Text Interpretation: Sinus rhythm Borderline prolonged PR interval Confirmed by Lennice Sites [phone]) on 08/11/2021 2:55:02  PM ? ?Radiology ?No results found. ? ?Procedures ?Procedures  ? ? ?Medications Ordered in ED ?Medications  ?sodium chloride 0.9 % bolus 1,000 mL (0 mLs Intravenous Stopped 08/11/21 1603)  ?dicyclomine (BENTYL) capsule 10 mg (10 mg Oral Given 08/11/21 1612)  ? ? ?ED Course/ Medical Decision Making/ A&P ?  ?                        ?Medical Decision Making ?Amount and/or Complexity of Data Reviewed ?Labs: ordered. ? ?Risk ?Prescription drug management. ? ?61 year old male presenting to the ER with concerns of several hours of nausea, vomiting and diarrhea.  On arrival, he is well-appearing, no acute distress, resting comfortably in the ER bed.  His vitals are overall reassuring, he is slightly bradycardic with a rate of 57/59, which per chart review appears to be his baseline.  He has no chest pain.  He has a borderline prolonged QT on EKG review.  Differential diagnosis includes viral gastroenteritis, diverticulitis, cholecystitis, appendicitis, C. difficile infection, small bowel obstruction/ileus. ? ? ?Labs ordered, reviewed and interpreted by me.  CBC without leukocytosis, normal hemoglobin.  CMP without any electrode abnormalities, creatinine 1.5 consistent with mild AKI/dehydration.  Normal LFTs.  COVID and flu are negative, lipase is normal. ? ?On exam, his abdomen is soft and nontender, not suggestive of an acute abdomen.  He has no CVA tenderness to suggest pyelonephritis.  I have low suspicion for acute intra abdominal abnormality. Not febrile, low suspicion for CDIFF infection at this time and the patient was not able to provide a stool sample in the ER.  Suspect viral gastroenteritis.  ? ?He was given 1 L of IV fluids, Bentyl and on my reevaluation, states he feels much improved.  Tolerated p.o. fluids well.  Will avoid sending home with additional Zofran/Bentyl given his borderline prolonged QT ? ?I recommended continuing rehydration and close follow-up with PCP in a week to recheck his creatinine.  We  discussed return precautions.  He voiced her standing and is agreeable.  Stable for discharge. ?Final Clinical Impression(s) / ED Diagnoses ?Final diagnoses:  ?Viral gastroenteritis  ? ? ?Rx / DC Orders ?ED Discharge Orders   ? ? None  ? ?  ? ? ?  ?Garald Balding, [name] ?08/11/21 1627 ? ?  ?Fredia Sorrow, MD ?08/12/21 1658 ? ?

## 2021-08-19 NOTE — Progress Notes (Signed)
Evaluation Performed:  Follow-up visit  Date:  08/27/2021   ID:  Carlos Harrison, DOB [birth year], MRN [id number]   PCP:  [name], MD  Cardiologist:  Jenkins Rouge, MD   Electrophysiologist:  None   Chief Complaint:  PAF/CHF  History of Present Illness:    61 y.o. HTN, HLD, PAF and chronic systolic CHF. 0/86/57 admitted with rapid afib/flutter. TEE/DCC 11/16/17 EF during arrhythmia 15-20%Improved since [facility].  Anticoagulation with normal dose xarelto.    Echo done 02/15/18 reviewed EF improved to 45-50%  Cardiac CTA 09/19/18 calcium score 0 normal right dominant coronary arteries   Home with all his kids during COVID crisis not very active but no cardiac Complaints   Injured his left index finger biting nails and infected with oil. 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     Past Medical History:  Diagnosis Date   Atrial fibrillation ([place]) 10/2017   Cardiomyopathy ([place]) 11/10/2017   Dyspnea    Hyperlipidemia    Hypertension    Palpitations 10/26/2017   Past Surgical History:  Procedure Laterality Date   CARDIOVERSION N/A 11/16/2017   Procedure: CARDIOVERSION;  Surgeon: Nahser, Wonda Cheng, MD;  Location: [facility];  Service: Cardiovascular;  Laterality: N/A;   EYE SURGERY Left    age 66 or 43   TEE WITH CARDIOVERSION  11/16/2017   TEE WITHOUT CARDIOVERSION N/A 11/16/2017   Procedure:  TRANSESOPHAGEAL ECHOCARDIOGRAM (TEE);  Surgeon: Acie Fredrickson Wonda Cheng, MD;  Location[facility] ENDOSCOPY;  Service: Cardiovascular;  Laterality: N/A;     Current Meds  Medication Sig   hydrocortisone 2.5 % cream Apply topically 2 (two) times daily. Use for up to 14 days   metoprolol succinate (TOPROL XL) 25 MG 24 hr tablet Take 1 tablet (25 mg total) by mouth daily.   rivaroxaban (XARELTO) 20 MG TABS tablet Take 1 tablet (20 mg total) by mouth daily with supper. Please keep appt in February for future refills   rosuvastatin (CRESTOR) 10 MG tablet Take 1 tablet (10 mg total) by mouth daily.   sacubitril-valsartan (ENTRESTO) 97-103 MG Take 1 tablet by mouth 2 (two) times daily.   spironolactone (ALDACTONE) 25 MG tablet Take 0.5 tablets (12.5 mg total) by mouth daily.   tadalafil (CIALIS) 20 MG tablet Take 0.5-1 tablets (10-20 mg total) by mouth every other day as needed for erectile dysfunction.     Allergies:   Patient has no known allergies.   Social History   Tobacco Use   Smoking status: Never   Smokeless tobacco: Never  Vaping Use   Vaping Use: Never used  Substance Use Topics   Alcohol use: No   Drug use: No     Family Hx: The patient's family history includes Cirrhosis in his brother; Diabetes in his brother; Heart disease in his brother and brother; Hypertension in his son; Ovarian cancer in  his mother; Premature birth in his daughter; Seizures in his daughter.  ROS:   Please see the history of present illness.     All other systems reviewed and are negative.   Prior CV studies:   The following studies were reviewed today:  Echo November 2019  Labs/Other Tests and Data Reviewed:    EKG: SR rate 54 normal 01/23/18  08/27/2021 SR rate 63 normal 08/27/2021 NSR rate 53 normal   Recent Labs: 05/13/2021: TSH 2.68 08/11/2021: ALT 20; BUN 12; Creatinine, Ser 1.50; Hemoglobin 14.0; Platelets 216; Potassium 4.5; Sodium 137   Recent Lipid Panel Lab Results  Component Value Date/Time    CHOL 157 05/13/2021 02:52 PM   TRIG 124.0 05/13/2021 02:52 PM   HDL 48.30 05/13/2021 02:52 PM   CHOLHDL 3 05/13/2021 02:52 PM   LDLCALC 84 05/13/2021 02:52 PM   LDLDIRECT 108.0 05/07/2020 11:32 AM    Wt Readings from Last 3 Encounters:  08/27/21 227 lb (103 kg)  08/11/21 226 lb 3.2 oz (102.6 kg)  07/23/21 226 lb 3.2 oz (102.6 kg)     Objective:    Vital Signs:  BP 102/72   Pulse 71   Ht 6' 3.5" (1.918 m)   Wt 227 lb (103 kg)   SpO2 98%   BMI 28.00 kg/m    Affect appropriate Healthy:  appears stated age HEENT: normal Neck supple with no adenopathy JVP normal no bruits no thyromegaly Lungs clear with no wheezing and good diaphragmatic motion Heart:  S1/S2 no murmur, no rub, gallop or click PMI normal Abdomen: benighn, BS positve, no tenderness, no AAA no bruit.  No HSM or HJR Distal pulses intact with no bruits No edema Neuro non-focal Skin warm and dry No muscular weakness    ASSESSMENT & PLAN:    PAF: maintaining NSR continue lower dose Toprol and anticoagulation DCM:  EF improved on goal directed Rx including enteresto EF 55-60% % by TTE 04/23/20 Cardiac CTA June 2020 with calcium score 0 and normal right dominant coronary arteries  Increase entresto  HLD:  On statin labs with primary LDL 80 05/13/21 ok given calcium score 0  COVID-19 Education: The signs and symptoms of COVID-19 were discussed with the patient and how to seek care for testing (follow up with PCP or arrange E-visit).  The importance of social distancing was discussed today.   Medication Adjustments/Labs and Tests Ordered:    F/U in a year   Signed, Jenkins Rouge, MD  08/27/2021 3:16 PM    [facility]

## 2021-08-27 ENCOUNTER — Other Ambulatory Visit (HOSPITAL_COMMUNITY): Payer: Self-pay

## 2021-08-27 ENCOUNTER — Ambulatory Visit (INDEPENDENT_AMBULATORY_CARE_PROVIDER_SITE_OTHER): Payer: 59 | Admitting: Cardiovascular Disease

## 2021-08-27 ENCOUNTER — Encounter: Payer: Self-pay | Admitting: Cardiovascular Disease

## 2021-08-27 VITALS — BP 102/72 | HR 71 | Ht 75.5 in | Wt 227.0 lb

## 2021-08-27 DIAGNOSIS — I10 Essential (primary) hypertension: Secondary | ICD-10-CM

## 2021-08-27 DIAGNOSIS — I420 Dilated cardiomyopathy: Secondary | ICD-10-CM

## 2021-08-27 MED ORDER — RIVAROXABAN 20 MG PO TABS
20.0000 mg | ORAL_TABLET | Freq: Every day | ORAL | 3 refills | Status: DC
  Filled 2021-08-27: qty 90, 90d supply, fill #0

## 2021-08-27 MED ORDER — RIVAROXABAN 20 MG PO TABS
20.0000 mg | ORAL_TABLET | Freq: Every day | ORAL | 3 refills | Status: DC
  Filled 2021-08-27: qty 90, 90d supply, fill #0
  Filled 2021-12-01: qty 90, 90d supply, fill #1
  Filled 2022-02-26: qty 90, 90d supply, fill #2
  Filled 2022-05-31: qty 90, 90d supply, fill #3

## 2021-08-27 NOTE — Patient Instructions (Addendum)
Medication Instructions:  Your physician recommends that you continue on your current medications as directed. Please refer to the Current Medication list given to you today.  *If you need a refill on your cardiac medications before your next appointment, please call your pharmacy*  Lab Work: If you have labs (blood work) drawn today and your tests are completely normal, you will receive your results only by: MyChart Message (if you have MyChart) OR A paper copy in the mail If you have any lab test that is abnormal or we need to change your treatment, we will call you to review the results.  Testing/Procedures: None ordered today.  Follow-Up: At [facility], you and your health needs are our priority.  As part of our continuing mission to provide you with exceptional heart care, we have created designated Provider Care Teams.  These Care Teams include your primary Cardiologist (physician) and Advanced Practice Providers (APPs -  Physician Assistants and Nurse Practitioners) who all work together to provide you with the care you need, when you need it.  We recommend signing up for the patient portal called "MyChart".  Sign up information is provided on this After Visit Summary.  MyChart is used to connect with patients for Virtual Visits (Telemedicine).  Patients are able to view lab/test results, encounter notes, upcoming appointments, etc.  Non-urgent messages can be sent to your provider as well.   To learn more about what you can do with MyChart, go to [email or web address].    Your next appointment:   12 month(s)  The format for your next appointment:   In Person  Provider:   Peter Nishan, MD {   Important Information About Sugar       

## 2021-09-07 ENCOUNTER — Other Ambulatory Visit (HOSPITAL_COMMUNITY): Payer: Self-pay

## 2021-09-15 DIAGNOSIS — Z760 Encounter for issue of repeat prescription: Secondary | ICD-10-CM | POA: Diagnosis not present

## 2021-10-06 ENCOUNTER — Other Ambulatory Visit (HOSPITAL_COMMUNITY): Payer: Self-pay

## 2021-10-11 ENCOUNTER — Encounter: Payer: Self-pay | Admitting: Internal Medicine

## 2021-10-15 ENCOUNTER — Other Ambulatory Visit (HOSPITAL_COMMUNITY): Payer: Self-pay

## 2021-10-15 MED ORDER — AMOXICILLIN 500 MG PO CAPS
500.0000 mg | ORAL_CAPSULE | Freq: Three times a day (TID) | ORAL | 0 refills | Status: DC
  Filled 2021-10-15: qty 21, 7d supply, fill #0

## 2021-10-16 ENCOUNTER — Other Ambulatory Visit: Payer: Self-pay | Admitting: Internal Medicine

## 2021-10-16 ENCOUNTER — Other Ambulatory Visit (HOSPITAL_COMMUNITY): Payer: Self-pay

## 2021-10-16 MED ORDER — ROSUVASTATIN CALCIUM 10 MG PO TABS
10.0000 mg | ORAL_TABLET | Freq: Every day | ORAL | 1 refills | Status: DC
  Filled 2021-10-16: qty 90, 90d supply, fill #0
  Filled 2022-01-13: qty 90, 90d supply, fill #1

## 2021-11-06 ENCOUNTER — Other Ambulatory Visit (HOSPITAL_COMMUNITY): Payer: Self-pay

## 2021-11-11 NOTE — Patient Instructions (Addendum)
     Blood work was ordered.     Medications changes include :      Your prescription(s) have been sent to your pharmacy.    A referral was ordered for XX.     Someone from that office will call you to schedule an appointment.    Return in about 6 months (around 05/19/2022) for Physical Exam.

## 2021-11-11 NOTE — Progress Notes (Signed)
      Subjective:    Patient ID: Carlos Harrison, male    DOB: Mar 20, 1961, 61 y.o.   MRN: [id number]     HPI Carlos Harrison is here for follow up of his chronic medical problems, including CMP, Aflutter, htn, hld, DM    Medications and allergies reviewed with patient and updated if appropriate.  Current Outpatient Medications on File Prior to Visit  Medication Sig Dispense Refill  . amoxicillin (AMOXIL) 500 MG capsule Take 1 capsule (500 mg total) by mouth every 8 (eight) hours. 21 capsule 0  . hydrocortisone 2.5 % cream Apply topically 2 (two) times daily. Use for up to 14 days 30 g 0  . metoprolol succinate (TOPROL XL) 25 MG 24 hr tablet Take 1 tablet (25 mg total) by mouth daily. 90 tablet 3  . rivaroxaban (XARELTO) 20 MG TABS tablet Take 1 tablet (20 mg total) by mouth daily with supper. 90 tablet 3  . rosuvastatin (CRESTOR) 10 MG tablet Take 1 tablet (10 mg total) by mouth daily. 90 tablet 1  . sacubitril-valsartan (ENTRESTO) 97-103 MG Take 1 tablet by mouth 2 (two) times daily. 60 tablet 11  . tadalafil (CIALIS) 20 MG tablet Take 0.5-1 tablets (10-20 mg total) by mouth every other day as needed for erectile dysfunction. 10 tablet 11   No current facility-administered medications on file prior to visit.     Review of Systems     Objective:  There were no vitals filed for this visit. BP Readings from Last 3 Encounters:  08/27/21 102/72  08/11/21 136/88  07/23/21 120/70   Wt Readings from Last 3 Encounters:  08/27/21 227 lb (103 kg)  08/11/21 226 lb 3.2 oz (102.6 kg)  07/23/21 226 lb 3.2 oz (102.6 kg)   There is no height or weight on file to calculate BMI.    Physical Exam     Lab Results  Component Value Date   WBC 7.3 08/11/2021   HGB 14.0 08/11/2021   HCT 43.7 08/11/2021   PLT 216 08/11/2021   GLUCOSE 110 (H) 08/11/2021   CHOL 157 05/13/2021   TRIG 124.0 05/13/2021   HDL 48.30 05/13/2021   LDLDIRECT 108.0 05/07/2020   LDLCALC 84 05/13/2021   ALT 20  08/11/2021   AST 31 08/11/2021   NA 137 08/11/2021   K 4.5 08/11/2021   CL 105 08/11/2021   CREATININE 1.50 (H) 08/11/2021   BUN 12 08/11/2021   CO2 22 08/11/2021   TSH 2.68 05/13/2021   PSA 1.56 05/13/2021   HGBA1C 6.5 05/13/2021     Assessment & Plan:    See Problem List for Assessment and Plan of chronic medical problems.    This encounter was created in error - please disregard.

## 2021-11-16 ENCOUNTER — Encounter: Payer: 59 | Admitting: Internal Medicine

## 2021-11-16 DIAGNOSIS — I10 Essential (primary) hypertension: Secondary | ICD-10-CM

## 2021-11-16 DIAGNOSIS — E119 Type 2 diabetes mellitus without complications: Secondary | ICD-10-CM

## 2021-11-16 DIAGNOSIS — I4892 Unspecified atrial flutter: Secondary | ICD-10-CM

## 2021-11-16 DIAGNOSIS — I420 Dilated cardiomyopathy: Secondary | ICD-10-CM

## 2021-11-16 DIAGNOSIS — E7849 Other hyperlipidemia: Secondary | ICD-10-CM

## 2021-12-01 ENCOUNTER — Other Ambulatory Visit (HOSPITAL_COMMUNITY): Payer: Self-pay

## 2021-12-07 ENCOUNTER — Other Ambulatory Visit (HOSPITAL_COMMUNITY): Payer: Self-pay

## 2021-12-14 ENCOUNTER — Ambulatory Visit: Payer: 59 | Admitting: Internal Medicine

## 2021-12-31 ENCOUNTER — Other Ambulatory Visit (HOSPITAL_COMMUNITY): Payer: Self-pay

## 2022-01-06 ENCOUNTER — Other Ambulatory Visit (HOSPITAL_COMMUNITY): Payer: Self-pay

## 2022-01-13 ENCOUNTER — Other Ambulatory Visit: Payer: Self-pay | Admitting: Internal Medicine

## 2022-01-13 ENCOUNTER — Other Ambulatory Visit (HOSPITAL_COMMUNITY): Payer: Self-pay

## 2022-01-13 MED ORDER — SPIRONOLACTONE 25 MG PO TABS
12.5000 mg | ORAL_TABLET | Freq: Every day | ORAL | 3 refills | Status: DC
Start: 2022-01-13 — End: 2022-12-09
  Filled 2022-01-13: qty 45, 90d supply, fill #0
  Filled 2022-04-07: qty 45, 90d supply, fill #1
  Filled 2022-07-08: qty 45, 90d supply, fill #2
  Filled 2022-09-08 – 2022-09-14 (×2): qty 45, 90d supply, fill #3

## 2022-02-04 ENCOUNTER — Other Ambulatory Visit (HOSPITAL_COMMUNITY): Payer: Self-pay

## 2022-02-26 ENCOUNTER — Other Ambulatory Visit (HOSPITAL_COMMUNITY): Payer: Self-pay

## 2022-03-08 NOTE — Progress Notes (Unsigned)
    Subjective:    Patient ID: Carlos Harrison, male    DOB: July 06, 1960, 61 y.o.   MRN: [id number]      HPI Carlos Harrison is here for No chief complaint on file.    ? Pinched nerve in neck/shoulder    Medications and allergies reviewed with patient and updated if appropriate.  Current Outpatient Medications on File Prior to Visit  Medication Sig Dispense Refill   amoxicillin (AMOXIL) 500 MG capsule Take 1 capsule (500 mg total) by mouth every 8 (eight) hours. 21 capsule 0   hydrocortisone 2.5 % cream Apply topically 2 (two) times daily. Use for up to 14 days 30 g 0   metoprolol succinate (TOPROL XL) 25 MG 24 hr tablet Take 1 tablet (25 mg total) by mouth daily. 90 tablet 3   rivaroxaban (XARELTO) 20 MG TABS tablet Take 1 tablet (20 mg total) by mouth daily with supper. 90 tablet 3   rosuvastatin (CRESTOR) 10 MG tablet Take 1 tablet (10 mg total) by mouth daily. 90 tablet 1   sacubitril-valsartan (ENTRESTO) 97-103 MG Take 1 tablet by mouth 2 (two) times daily. 60 tablet 11   spironolactone (ALDACTONE) 25 MG tablet Take 0.5 tablets (12.5 mg total) by mouth daily. 45 tablet 3   tadalafil (CIALIS) 20 MG tablet Take 0.5-1 tablets (10-20 mg total) by mouth every other day as needed for erectile dysfunction. 10 tablet 11   No current facility-administered medications on file prior to visit.    Review of Systems     Objective:  There were no vitals filed for this visit. BP Readings from Last 3 Encounters:  08/27/21 102/72  08/11/21 136/88  07/23/21 120/70   Wt Readings from Last 3 Encounters:  08/27/21 227 lb (103 kg)  08/11/21 226 lb 3.2 oz (102.6 kg)  07/23/21 226 lb 3.2 oz (102.6 kg)   There is no height or weight on file to calculate BMI.    Physical Exam         Assessment & Plan:    See Problem List for Assessment and Plan of chronic medical problems.

## 2022-03-09 ENCOUNTER — Ambulatory Visit: Payer: 59 | Admitting: Internal Medicine

## 2022-03-09 ENCOUNTER — Encounter: Payer: Self-pay | Admitting: Internal Medicine

## 2022-03-09 ENCOUNTER — Other Ambulatory Visit (HOSPITAL_COMMUNITY): Payer: Self-pay

## 2022-03-09 VITALS — BP 138/84 | HR 52 | Temp 98.1°F | Ht 75.5 in | Wt 223.0 lb

## 2022-03-09 DIAGNOSIS — M542 Cervicalgia: Secondary | ICD-10-CM

## 2022-03-09 MED ORDER — METHOCARBAMOL 500 MG PO TABS
500.0000 mg | ORAL_TABLET | Freq: Three times a day (TID) | ORAL | 0 refills | Status: DC | PRN
Start: 2022-03-09 — End: 2022-07-16
  Filled 2022-03-09: qty 30, 10d supply, fill #0

## 2022-03-09 NOTE — Patient Instructions (Addendum)
       Medications changes include : methocarbamol - muscle relaxer       Return if symptoms worsen or fail to improve, for schedule a follow visit early next year.

## 2022-03-09 NOTE — Assessment & Plan Note (Signed)
Acute Right-sided neck pain that started yesterday morning when he woke up Likely muscular in nature He does have some radiation of the pain to the right upper arm so there may be some radiculopathy Continue heat Advised massage, gentle stretching Start methocarbamol 500 mg 3 times daily as needed Can take Tylenol No NSAIDs secondary to being on Xarelto Would avoid prednisone given side effects in the past Call if no improvement

## 2022-03-10 ENCOUNTER — Other Ambulatory Visit (HOSPITAL_COMMUNITY): Payer: Self-pay

## 2022-04-07 ENCOUNTER — Other Ambulatory Visit: Payer: Self-pay

## 2022-04-07 ENCOUNTER — Other Ambulatory Visit (HOSPITAL_COMMUNITY): Payer: Self-pay

## 2022-04-07 ENCOUNTER — Other Ambulatory Visit: Payer: Self-pay | Admitting: Internal Medicine

## 2022-04-07 MED ORDER — ROSUVASTATIN CALCIUM 10 MG PO TABS
10.0000 mg | ORAL_TABLET | Freq: Every day | ORAL | 1 refills | Status: DC
  Filled 2022-04-07: qty 90, 90d supply, fill #0
  Filled 2022-07-08: qty 90, 90d supply, fill #1

## 2022-04-15 ENCOUNTER — Ambulatory Visit: Payer: Self-pay | Admitting: Internal Medicine

## 2022-06-07 ENCOUNTER — Other Ambulatory Visit: Payer: Self-pay | Admitting: Cardiovascular Disease

## 2022-06-07 ENCOUNTER — Other Ambulatory Visit (HOSPITAL_COMMUNITY): Payer: Self-pay

## 2022-06-07 MED ORDER — ENTRESTO 97-103 MG PO TABS
1.0000 | ORAL_TABLET | Freq: Two times a day (BID) | ORAL | 5 refills | Status: DC
  Filled 2022-06-07: qty 60, 30d supply, fill #0
  Filled 2022-07-08: qty 60, 30d supply, fill #1
  Filled 2022-08-10: qty 60, 30d supply, fill #2
  Filled 2022-09-08: qty 60, 30d supply, fill #3
  Filled 2022-10-07: qty 60, 30d supply, fill #4
  Filled 2022-11-09: qty 60, 30d supply, fill #5

## 2022-06-29 ENCOUNTER — Other Ambulatory Visit (HOSPITAL_COMMUNITY): Payer: Self-pay

## 2022-06-29 ENCOUNTER — Other Ambulatory Visit: Payer: Self-pay | Admitting: Cardiovascular Disease

## 2022-06-29 MED ORDER — METOPROLOL SUCCINATE ER 25 MG PO TB24
25.0000 mg | ORAL_TABLET | Freq: Every day | ORAL | 3 refills | Status: DC
  Filled 2022-06-29: qty 90, 90d supply, fill #0
  Filled 2022-09-28: qty 90, 90d supply, fill #1
  Filled 2022-12-27: qty 90, 90d supply, fill #2
  Filled 2023-03-25: qty 90, 90d supply, fill #3

## 2022-07-08 ENCOUNTER — Other Ambulatory Visit (HOSPITAL_COMMUNITY): Payer: Self-pay

## 2022-07-15 NOTE — Progress Notes (Signed)
Subjective:    Patient ID: Carlos Harrison, male    DOB: Sep 27, 1960, 62 y.o.   MRN: [id number]     HPI Carlos Harrison is here for follow up of his chronic medical problems.  Very active at walk - walks a lot.  He is having right thumb pain.    Medications and allergies reviewed with patient and updated if appropriate.  Current Outpatient Medications on File Prior to Visit  Medication Sig Dispense Refill   methocarbamol (ROBAXIN) 500 MG tablet Take 1 tablet (500 mg total) by mouth every 8 (eight) hours as needed for muscle spasms. 30 tablet 0   metoprolol succinate (TOPROL XL) 25 MG 24 hr tablet Take 1 tablet (25 mg total) by mouth daily. 90 tablet 3   rivaroxaban (XARELTO) 20 MG TABS tablet Take 1 tablet (20 mg total) by mouth daily with supper. 90 tablet 3   rosuvastatin (CRESTOR) 10 MG tablet Take 1 tablet (10 mg total) by mouth daily. 90 tablet 1   sacubitril-valsartan (ENTRESTO) 97-103 MG Take 1 tablet by mouth 2 (two) times daily. 60 tablet 5   spironolactone (ALDACTONE) 25 MG tablet Take 0.5 tablets (12.5 mg total) by mouth daily. 45 tablet 3   tadalafil (CIALIS) 20 MG tablet Take 0.5-1 tablets (10-20 mg total) by mouth every other day as needed for erectile dysfunction. 10 tablet 11   No current facility-administered medications on file prior to visit.     Review of Systems  Constitutional:  Negative for fever.  Respiratory:  Negative for cough, shortness of breath and wheezing.   Cardiovascular:  Positive for chest pain (occ central chest pain - laying in uncomfortable position - change position resolved pain). Negative for palpitations and leg swelling.  Neurological:  Positive for light-headedness (if he gets up too quickly.). Negative for headaches.       Objective:   Vitals:   07/16/22 0841  BP: (!) 140/88  Pulse: (!) 53  Temp: 98.6 F (37 C)  SpO2: 99%   BP Readings from Last 3 Encounters:  07/16/22 (!) 140/88  03/09/22 138/84  08/27/21 102/72   Wt  Readings from Last 3 Encounters:  07/16/22 216 lb (98 kg)  03/09/22 223 lb (101.2 kg)  08/27/21 227 lb (103 kg)   Body mass index is 26.64 kg/m.    Physical Exam Constitutional:      General: He is not in acute distress.    Appearance: Normal appearance. He is not ill-appearing.  HENT:     Head: Normocephalic and atraumatic.  Eyes:     Conjunctiva/sclera: Conjunctivae normal.  Cardiovascular:     Rate and Rhythm: Normal rate and regular rhythm.     Heart sounds: Normal heart sounds.  Pulmonary:     Effort: Pulmonary effort is normal. No respiratory distress.     Breath sounds: Normal breath sounds. No wheezing or rales.  Musculoskeletal:     Right lower leg: No edema.     Left lower leg: No edema.  Skin:    General: Skin is warm and dry.     Findings: No rash.  Neurological:     Mental Status: He is alert. Mental status is at baseline.  Psychiatric:        Mood and Affect: Mood normal.        Lab Results  Component Value Date   WBC 7.3 08/11/2021   HGB 14.0 08/11/2021   HCT 43.7 08/11/2021   PLT 216 08/11/2021  GLUCOSE 110 (H) 08/11/2021   CHOL 157 05/13/2021   TRIG 124.0 05/13/2021   HDL 48.30 05/13/2021   LDLDIRECT 108.0 05/07/2020   LDLCALC 84 05/13/2021   ALT 20 08/11/2021   AST 31 08/11/2021   NA 137 08/11/2021   K 4.5 08/11/2021   CL 105 08/11/2021   CREATININE 1.50 (H) 08/11/2021   BUN 12 08/11/2021   CO2 22 08/11/2021   TSH 2.68 05/13/2021   PSA 1.56 05/13/2021   HGBA1C 6.5 05/13/2021     Assessment & Plan:    See Problem List for Assessment and Plan of chronic medical problems.

## 2022-07-15 NOTE — Patient Instructions (Addendum)
     Make an appt with sports medicine - Right thumb pain ( referral ordered)   Blood work was ordered.   The lab is on the first floor.     Medications changes include :   none       Return in about 6 months (around 01/15/2023) for Physical Exam.

## 2022-07-16 ENCOUNTER — Ambulatory Visit (INDEPENDENT_AMBULATORY_CARE_PROVIDER_SITE_OTHER): Payer: 59 | Admitting: Internal Medicine

## 2022-07-16 ENCOUNTER — Encounter: Payer: Self-pay | Admitting: Internal Medicine

## 2022-07-16 VITALS — BP 130/80 | HR 53 | Temp 98.6°F | Ht 75.5 in | Wt 216.0 lb

## 2022-07-16 DIAGNOSIS — Z125 Encounter for screening for malignant neoplasm of prostate: Secondary | ICD-10-CM

## 2022-07-16 DIAGNOSIS — E7849 Other hyperlipidemia: Secondary | ICD-10-CM | POA: Diagnosis not present

## 2022-07-16 DIAGNOSIS — I4892 Unspecified atrial flutter: Secondary | ICD-10-CM

## 2022-07-16 DIAGNOSIS — N529 Male erectile dysfunction, unspecified: Secondary | ICD-10-CM

## 2022-07-16 DIAGNOSIS — I420 Dilated cardiomyopathy: Secondary | ICD-10-CM

## 2022-07-16 DIAGNOSIS — M79644 Pain in right finger(s): Secondary | ICD-10-CM

## 2022-07-16 DIAGNOSIS — E119 Type 2 diabetes mellitus without complications: Secondary | ICD-10-CM

## 2022-07-16 DIAGNOSIS — I10 Essential (primary) hypertension: Secondary | ICD-10-CM | POA: Diagnosis not present

## 2022-07-16 LAB — CBC WITH DIFFERENTIAL/PLATELET
Basophils Absolute: 0 10*3/uL (ref 0.0–0.1)
Basophils Relative: 0.4 % (ref 0.0–3.0)
Eosinophils Absolute: 0.1 10*3/uL (ref 0.0–0.7)
Eosinophils Relative: 4.2 % (ref 0.0–5.0)
HCT: 37 % — ABNORMAL LOW (ref 39.0–52.0)
Hemoglobin: 12.5 g/dL — ABNORMAL LOW (ref 13.0–17.0)
Lymphocytes Relative: 47.7 % — ABNORMAL HIGH (ref 12.0–46.0)
Lymphs Abs: 1.4 10*3/uL (ref 0.7–4.0)
MCHC: 33.7 g/dL (ref 30.0–36.0)
MCV: 85.8 fl (ref 78.0–100.0)
Monocytes Absolute: 0.2 10*3/uL (ref 0.1–1.0)
Monocytes Relative: 8.3 % (ref 3.0–12.0)
Neutro Abs: 1.1 10*3/uL — ABNORMAL LOW (ref 1.4–7.7)
Neutrophils Relative %: 39.4 % — ABNORMAL LOW (ref 43.0–77.0)
Platelets: 181 10*3/uL (ref 150.0–400.0)
RBC: 4.31 Mil/uL (ref 4.22–5.81)
RDW: 15.3 % (ref 11.5–15.5)
WBC: 2.8 10*3/uL — ABNORMAL LOW (ref 4.0–10.5)

## 2022-07-16 LAB — MICROALBUMIN / CREATININE URINE RATIO
Creatinine,U: 277.9 mg/dL
Microalb Creat Ratio: 0.4 mg/g (ref 0.0–30.0)
Microalb, Ur: 1.2 mg/dL (ref 0.0–1.9)

## 2022-07-16 LAB — LIPID PANEL
Cholesterol: 156 mg/dL (ref 0–200)
HDL: 49.1 mg/dL (ref >39.00)
LDL Cholesterol: 94 mg/dL (ref 0–99)
NonHDL: 106.8
Total CHOL/HDL Ratio: 3
Triglycerides: 62 mg/dL (ref 0.0–149.0)
VLDL: 12.4 mg/dL (ref 0.0–40.0)

## 2022-07-16 LAB — COMPREHENSIVE METABOLIC PANEL
ALT: 12 U/L (ref 0–53)
AST: 21 U/L (ref 0–37)
Albumin: 4.3 g/dL (ref 3.5–5.2)
Alkaline Phosphatase: 45 U/L (ref 39–117)
BUN: 14 mg/dL (ref 6–23)
CO2: 28 mEq/L (ref 19–32)
Calcium: 9 mg/dL (ref 8.4–10.5)
Chloride: 105 mEq/L (ref 96–112)
Creatinine, Ser: 1.14 mg/dL (ref 0.40–1.50)
GFR: 70.2 mL/min (ref >60.00)
Glucose, Bld: 94 mg/dL (ref 70–99)
Potassium: 4 mEq/L (ref 3.5–5.1)
Sodium: 141 mEq/L (ref 135–145)
Total Bilirubin: 0.8 mg/dL (ref 0.2–1.2)
Total Protein: 6.9 g/dL (ref 6.0–8.3)

## 2022-07-16 LAB — PSA: PSA: 0.94 ng/mL (ref 0.10–4.00)

## 2022-07-16 LAB — HEMOGLOBIN A1C: Hgb A1c MFr Bld: 6.1 % (ref 4.6–6.5)

## 2022-07-16 NOTE — Assessment & Plan Note (Signed)
Chronic Continue cialis 10-20 mg daily prn

## 2022-07-16 NOTE — Assessment & Plan Note (Addendum)
Chronic BP initially elevated - better controlled on recheck Continue metoprolol xl 25 mg daily, entresto 97-103 mg bid, spironolactone 12.5 mg daily Cmp Monitor BP at home

## 2022-07-16 NOTE — Assessment & Plan Note (Signed)
Chronic °Following with cardio °On entresto, spironolactone, crestor, metoprolol xl °euvolemic °

## 2022-07-16 NOTE — Assessment & Plan Note (Signed)
Chronic Check lipid panel, CMP Continue crestor 10 mg daily Regular exercise and healthy diet encouraged  Lab Results  Component Value Date   LDLCALC 84 05/13/2021

## 2022-07-16 NOTE — Assessment & Plan Note (Signed)
Chronic No symptoms Rate controlled Sees cardio On metoprolol xl 25 mg daily and xarelto 20 mg daily Cbc, cmp

## 2022-07-16 NOTE — Assessment & Plan Note (Signed)
Chronic   Lab Results  Component Value Date   HGBA1C 6.5 05/13/2021   Sugars controlled Check A1c, urine microalbumin today Continue lifestyle control as long as sugars are controlled Stressed regular exercise, diabetic diet

## 2022-07-19 NOTE — Progress Notes (Unsigned)
   Rubin Payor, PhD, LAT, ATC acting as a scribe for Carlos Graham, MD.  Carlos Harrison is a 62 y.o. male who presents to [facility] at [facility] today for R thumb pain. Pt is R-hand dominate. Pt was previously seen by Dr. Denyse Amass on 11/12/20 for L trigger thumb.   Today, pt c/o R thumb pain x ***. Pt locates pain to ***  Aggravates: Treatments tried:  Pertinent review of systems: ***  Relevant historical information: ***   Exam:  There were no vitals taken for this visit. General: Well Developed, well nourished, and in no acute distress.   MSK: ***    Lab and Radiology Results No results found for this or any previous visit (from the past 72 hour(s)). No results found.     Assessment and Plan: 62 y.o. male with ***   PDMP not reviewed this encounter. No orders of the defined types were placed in this encounter.  No orders of the defined types were placed in this encounter.    Discussed warning signs or symptoms. Please see discharge instructions. Patient expresses understanding.   ***

## 2022-07-20 ENCOUNTER — Ambulatory Visit (INDEPENDENT_AMBULATORY_CARE_PROVIDER_SITE_OTHER): Payer: 59 | Admitting: Family Medicine

## 2022-07-20 ENCOUNTER — Other Ambulatory Visit: Payer: Self-pay

## 2022-07-20 VITALS — BP 122/86 | HR 52 | Ht 75.5 in | Wt 213.0 lb

## 2022-07-20 DIAGNOSIS — M65311 Trigger thumb, right thumb: Secondary | ICD-10-CM | POA: Diagnosis not present

## 2022-07-20 DIAGNOSIS — M79644 Pain in right finger(s): Secondary | ICD-10-CM

## 2022-07-20 DIAGNOSIS — G8929 Other chronic pain: Secondary | ICD-10-CM

## 2022-07-20 NOTE — Patient Instructions (Addendum)
Thank you for coming in today.   You received an injection today. Seek immediate medical attention if the joint becomes red, extremely painful, or is oozing fluid.   Return as needed.   Use the doublebandaid splint.  

## 2022-08-10 ENCOUNTER — Other Ambulatory Visit (HOSPITAL_COMMUNITY): Payer: Self-pay

## 2022-08-13 ENCOUNTER — Encounter: Payer: Self-pay | Admitting: Cardiovascular Disease

## 2022-08-16 ENCOUNTER — Other Ambulatory Visit: Payer: Self-pay | Admitting: Pharmacist

## 2022-08-16 ENCOUNTER — Other Ambulatory Visit (HOSPITAL_COMMUNITY): Payer: Self-pay

## 2022-08-16 DIAGNOSIS — E7849 Other hyperlipidemia: Secondary | ICD-10-CM

## 2022-08-16 MED ORDER — ROSUVASTATIN CALCIUM 10 MG PO TABS
10.0000 mg | ORAL_TABLET | Freq: Every day | ORAL | 1 refills | Status: DC
  Filled 2022-08-16 – 2022-10-07 (×2): qty 90, 90d supply, fill #0
  Filled 2023-01-04: qty 90, 90d supply, fill #1

## 2022-08-24 ENCOUNTER — Other Ambulatory Visit (HOSPITAL_COMMUNITY): Payer: Self-pay

## 2022-08-24 ENCOUNTER — Other Ambulatory Visit: Payer: Self-pay | Admitting: Cardiovascular Disease

## 2022-08-24 MED ORDER — RIVAROXABAN 20 MG PO TABS
20.0000 mg | ORAL_TABLET | Freq: Every day | ORAL | 3 refills | Status: DC
  Filled 2022-08-24: qty 90, 90d supply, fill #0
  Filled 2022-11-19: qty 90, 90d supply, fill #1
  Filled 2023-02-14: qty 90, 90d supply, fill #2
  Filled 2023-05-12: qty 90, 90d supply, fill #3

## 2022-08-24 NOTE — Telephone Encounter (Signed)
Prescription refill request for Xarelto received.  Indication:aflutter Last office visit:6/23 Weight:96.6  kg Age:62 Scr:1.14 CrCl:94.15  ml/min  Prescription refilled

## 2022-09-08 ENCOUNTER — Other Ambulatory Visit (HOSPITAL_COMMUNITY): Payer: Self-pay

## 2022-09-08 ENCOUNTER — Other Ambulatory Visit: Payer: Self-pay

## 2022-09-14 ENCOUNTER — Other Ambulatory Visit (HOSPITAL_COMMUNITY): Payer: Self-pay

## 2022-09-17 ENCOUNTER — Other Ambulatory Visit (HOSPITAL_COMMUNITY): Payer: Self-pay

## 2022-10-01 ENCOUNTER — Other Ambulatory Visit: Payer: Self-pay | Admitting: Internal Medicine

## 2022-10-01 ENCOUNTER — Other Ambulatory Visit (HOSPITAL_COMMUNITY): Payer: Self-pay

## 2022-10-01 MED ORDER — TADALAFIL 20 MG PO TABS
10.0000 mg | ORAL_TABLET | ORAL | 11 refills | Status: DC | PRN
  Filled 2022-10-01: qty 10, 50d supply, fill #0

## 2022-10-07 ENCOUNTER — Other Ambulatory Visit: Payer: Self-pay

## 2022-10-07 ENCOUNTER — Other Ambulatory Visit (HOSPITAL_COMMUNITY): Payer: Self-pay

## 2022-10-28 NOTE — Progress Notes (Signed)
Evaluation Performed:  Follow-up visit  Date:  11/03/2022   ID:  Carlos Harrison, DOB 07/10/1960, MRN [id number]   PCP:  Pincus Sanes, MD  Cardiologist:  Charlton Haws, MD   Electrophysiologist:  None   Chief Complaint:  PAF/CHF  History of Present Illness:    62 y.o. HTN, HLD, PAF and chronic systolic CHF. 11/15/17 admitted with rapid afib/flutter. TEE/DCC 11/16/17 EF during arrhythmia 15-20%Improved since [facility]  Anticoagulation with normal dose xarelto.    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 Dehydration. Felt his heart beating a little Fast and hard   Past Medical History:  Diagnosis Date   Atrial fibrillation (HCC) 10/2017   Cardiomyopathy (HCC) 11/10/2017   Dyspnea    Hyperlipidemia    Hypertension    Palpitations 10/26/2017   Past Surgical History:  Procedure Laterality Date   CARDIOVERSION N/A 11/16/2017   Procedure: CARDIOVERSION;  Surgeon: Nahser, Deloris Ping, MD;  Location: [facility] ENDOSCOPY;  Service: Cardiovascular;  Laterality: N/A;   EYE SURGERY Left    age 55 or 89   TEE WITH CARDIOVERSION  11/16/2017   TEE WITHOUT CARDIOVERSION N/A 11/16/2017   Procedure: TRANSESOPHAGEAL ECHOCARDIOGRAM (TEE);  Surgeon: Vesta Mixer, MD;  Location: [facility] ENDOSCOPY;  Service: Cardiovascular;  Laterality: N/A;     Current Meds  Medication Sig   metoprolol succinate (TOPROL XL) 25 MG 24 hr tablet Take 1  tablet (25 mg total) by mouth daily.   rivaroxaban (XARELTO) 20 MG TABS tablet Take 1 tablet (20 mg total) by mouth daily with supper.   rosuvastatin (CRESTOR) 10 MG tablet Take 1 tablet (10 mg total) by mouth daily.   sacubitril-valsartan (ENTRESTO) 97-103 MG Take 1 tablet by mouth 2 (two) times daily.   spironolactone (ALDACTONE) 25 MG tablet Take 1/2 tablet (12.5 mg) by mouth daily.   tadalafil (CIALIS) 20 MG tablet Take 1/2-1 tablet (10-20 mg total) by mouth every other day as needed for erectile dysfunction.     Allergies:   Patient has no known allergies.   Social History   Tobacco Use   Smoking status: Never   Smokeless tobacco: Never  Vaping Use   Vaping status: Never Used  Substance Use Topics   Alcohol use: No   Drug use: No     Family Hx: The patient's family history includes Cirrhosis in his brother; Diabetes in his brother; Heart disease in his brother and brother; Hypertension in his son; Ovarian cancer in his mother; Premature birth in his daughter; Seizures in his daughter.  ROS:   Please see the history of present illness.     All other systems reviewed and are negative.   Prior CV studies:   The following studies were reviewed today:  Echo November 2019  Labs/Other Tests and Data Reviewed:    EKG: SR rate 54 normal 01/23/18  11/03/2022 SR rate 63 normal 11/03/2022 NSR rate 53 normal   Recent Labs: 07/16/2022: ALT 12; BUN 14; Creatinine, Ser 1.14; Hemoglobin 12.5; Platelets 181.0; Potassium 4.0; Sodium 141   Recent Lipid Panel Lab Results  Component Value Date/Time   CHOL 156 07/16/2022 09:26 AM   TRIG 62.0 07/16/2022 09:26 AM   HDL 49.10 07/16/2022 09:26 AM   CHOLHDL 3 07/16/2022 09:26 AM   LDLCALC 94 07/16/2022 09:26 AM   LDLDIRECT 108.0 05/07/2020 11:32 AM    Wt Readings from Last 3 Encounters:  11/03/22 216 lb (98 kg)  07/20/22 213 lb (96.6 kg)  07/16/22 216 lb (98 kg)     Objective:    Vital Signs:  BP (!) 138/98   Pulse (!) 51   Ht 6'  3" (1.905 m)   Wt 216 lb (98 kg)   SpO2 100%   BMI 27.00 kg/m    Affect appropriate Healthy:  appears stated age HEENT: normal Neck supple with no adenopathy JVP normal no bruits no thyromegaly Lungs clear with no wheezing and good diaphragmatic motion Heart:  S1/S2 no murmur, no rub, gallop or click PMI normal Abdomen: benighn, BS positve, no tenderness, no AAA no bruit.  No HSM or HJR Distal pulses intact with no bruits No edema Neuro non-focal Skin warm and dry No muscular weakness    ASSESSMENT & PLAN:    PAF: maintaining NSR continue lower dose Toprol and anticoagulation ? Short run PAF last week If continues can consider 30 day monitor In NSR today  DCM:  EF improved on goal directed Rx including enteresto EF 55-60% % by TTE 04/23/20 Cardiac CTA June 2020 with calcium score 0 and normal right dominant coronary arteries  Update TTE  HLD:  On statin labs with primary LDL 80 05/13/21 ok given calcium score 0 HTN:  will decrease sodium in diet. Monitor at home May need to increase aldactone to full tablet daily or add norvasc  COVID-19 Education: The signs and symptoms of COVID-19 were discussed with the patient and how to seek care for testing (follow up with PCP or arrange E-visit).  The importance of social distancing was discussed today.   Medication Adjustments/Labs and Tests Ordered:  TTE for cardiomyopathy BP cuff at home   F/U in a year   Signed, Charlton Haws, MD  11/03/2022 9:20 AM    [facility]

## 2022-11-03 ENCOUNTER — Ambulatory Visit: Payer: 59 | Attending: Cardiovascular Disease | Admitting: Cardiovascular Disease

## 2022-11-03 ENCOUNTER — Encounter: Payer: Self-pay | Admitting: Cardiovascular Disease

## 2022-11-03 VITALS — BP 138/98 | HR 51 | Ht 75.0 in | Wt 216.0 lb

## 2022-11-03 DIAGNOSIS — I420 Dilated cardiomyopathy: Secondary | ICD-10-CM

## 2022-11-03 DIAGNOSIS — I10 Essential (primary) hypertension: Secondary | ICD-10-CM

## 2022-11-03 DIAGNOSIS — I480 Paroxysmal atrial fibrillation: Secondary | ICD-10-CM | POA: Diagnosis not present

## 2022-11-03 NOTE — Patient Instructions (Signed)
Medication Instructions:  Your physician recommends that you continue on your current medications as directed. Please refer to the Current Medication list given to you today.  *If you need a refill on your cardiac medications before your next appointment, please call your pharmacy*  Lab Work: If you have labs (blood work) drawn today and your tests are completely normal, you will receive your results only by: [place] (if you have MyChart) OR A paper copy in the mail If you have any lab test that is abnormal or we need to change your treatment, we will call you to review the results.  Testing/Procedures: Your physician has requested that you have an echocardiogram. Echocardiography is a painless test that uses sound waves to create images of your heart. It provides your doctor with information about the size and shape of your heart and how well your heart's chambers and valves are working. This procedure takes approximately one hour. There are no restrictions for this procedure. Please do NOT wear cologne, perfume, aftershave, or lotions (deodorant is allowed). Please arrive 15 minutes prior to your appointment time.  Follow-Up: At [facility], you and your health needs are our priority.  As part of our continuing mission to provide you with exceptional heart care, we have created designated Provider Care Teams.  These Care Teams include your primary Cardiologist (physician) and Advanced Practice Providers (APPs -  Physician Assistants and Nurse Practitioners) who all work together to provide you with the care you need, when you need it.  We recommend signing up for the patient portal called "MyChart".  Sign up information is provided on this After Visit Summary.  MyChart is used to connect with patients for Virtual Visits (Telemedicine).  Patients are able to view lab/test results, encounter notes, upcoming appointments, etc.  Non-urgent messages can be sent to your provider as  well.   To learn more about what you can do with MyChart, go to [email or web address].    Your next appointment:   6 months   Provider:   Jenkins Rouge, MD

## 2022-11-10 ENCOUNTER — Other Ambulatory Visit (HOSPITAL_COMMUNITY): Payer: Self-pay

## 2022-11-10 MED ORDER — OMRON 3 SERIES BP MONITOR DEVI
0 refills | Status: DC
  Filled 2022-11-10: qty 1, 1d supply, fill #0

## 2022-11-19 ENCOUNTER — Other Ambulatory Visit (HOSPITAL_COMMUNITY): Payer: Self-pay

## 2022-11-22 ENCOUNTER — Ambulatory Visit (HOSPITAL_COMMUNITY): Payer: 59 | Attending: Cardiovascular Disease

## 2022-11-22 DIAGNOSIS — I517 Cardiomegaly: Secondary | ICD-10-CM | POA: Diagnosis not present

## 2022-11-22 DIAGNOSIS — I10 Essential (primary) hypertension: Secondary | ICD-10-CM | POA: Diagnosis not present

## 2022-11-22 DIAGNOSIS — I420 Dilated cardiomyopathy: Secondary | ICD-10-CM | POA: Diagnosis not present

## 2022-11-22 LAB — ECHOCARDIOGRAM COMPLETE
Area-P 1/2: 3.34 cm2
Calc EF: 46.8 %
S' Lateral: 3.6 cm
Single Plane A2C EF: 43.1 %
Single Plane A4C EF: 49.5 %

## 2022-11-30 ENCOUNTER — Encounter: Payer: Self-pay | Admitting: Cardiovascular Disease

## 2022-12-07 ENCOUNTER — Other Ambulatory Visit (HOSPITAL_BASED_OUTPATIENT_CLINIC_OR_DEPARTMENT_OTHER): Payer: Self-pay

## 2022-12-09 ENCOUNTER — Other Ambulatory Visit (HOSPITAL_COMMUNITY): Payer: Self-pay

## 2022-12-09 ENCOUNTER — Other Ambulatory Visit: Payer: Self-pay | Admitting: Cardiovascular Disease

## 2022-12-09 ENCOUNTER — Other Ambulatory Visit: Payer: Self-pay

## 2022-12-09 ENCOUNTER — Other Ambulatory Visit: Payer: Self-pay | Admitting: Internal Medicine

## 2022-12-09 MED ORDER — SPIRONOLACTONE 25 MG PO TABS
12.5000 mg | ORAL_TABLET | Freq: Every day | ORAL | 3 refills | Status: DC
  Filled 2022-12-09: qty 45, 90d supply, fill #0
  Filled 2023-03-09: qty 45, 90d supply, fill #1
  Filled 2023-05-30: qty 45, 90d supply, fill #2

## 2022-12-10 ENCOUNTER — Other Ambulatory Visit (HOSPITAL_COMMUNITY): Payer: Self-pay

## 2022-12-10 MED ORDER — ENTRESTO 97-103 MG PO TABS
1.0000 | ORAL_TABLET | Freq: Two times a day (BID) | ORAL | 11 refills | Status: DC
  Filled 2022-12-10: qty 60, 30d supply, fill #0
  Filled 2023-01-04: qty 60, 30d supply, fill #1
  Filled 2023-02-03: qty 60, 30d supply, fill #2
  Filled 2023-03-09: qty 60, 30d supply, fill #3
  Filled 2023-04-05: qty 60, 30d supply, fill #4
  Filled 2023-05-05: qty 60, 30d supply, fill #5
  Filled 2023-05-30: qty 60, 30d supply, fill #6
  Filled 2023-07-03: qty 60, 30d supply, fill #7
  Filled 2023-08-02: qty 60, 30d supply, fill #8
  Filled 2023-09-01: qty 60, 30d supply, fill #9
  Filled 2023-10-01: qty 60, 30d supply, fill #10
  Filled 2023-10-01: qty 60, 30d supply, fill #0
  Filled 2023-11-03: qty 60, 30d supply, fill #1

## 2023-01-17 ENCOUNTER — Encounter: Payer: Self-pay | Admitting: Internal Medicine

## 2023-01-17 NOTE — Patient Instructions (Addendum)
Blood work was ordered.   The lab is on the first floor.    Medications changes include :   none     Return in about 6 months (around 07/19/2023) for follow up.    Health Maintenance, Male Adopting a healthy lifestyle and getting preventive care are important in promoting health and wellness. Ask your health care provider about: The right schedule for you to have regular tests and exams. Things you can do on your own to prevent diseases and keep yourself healthy. What should I know about diet, weight, and exercise? Eat a healthy diet  Eat a diet that includes plenty of vegetables, fruits, low-fat dairy products, and lean protein. Do not eat a lot of foods that are high in solid fats, added sugars, or sodium. Maintain a healthy weight Body mass index (BMI) is a measurement that can be used to identify possible weight problems. It estimates body fat based on height and weight. Your health care provider can help determine your BMI and help you achieve or maintain a healthy weight. Get regular exercise Get regular exercise. This is one of the most important things you can do for your health. Most adults should: Exercise for at least 150 minutes each week. The exercise should increase your heart rate and make you sweat (moderate-intensity exercise). Do strengthening exercises at least twice a week. This is in addition to the moderate-intensity exercise. Spend less time sitting. Even light physical activity can be beneficial. Watch cholesterol and blood lipids Have your blood tested for lipids and cholesterol at 60 years of age, then have this test every 5 years. You may need to have your cholesterol levels checked more often if: Your lipid or cholesterol levels are high. You are older than 60 years of age. You are at high risk for heart disease. What should I know about cancer screening? Many types of cancers can be detected early and may often be prevented. Depending on your  health history and family history, you may need to have cancer screening at various ages. This may include screening for: Colorectal cancer. Prostate cancer. Skin cancer. Lung cancer. What should I know about heart disease, diabetes, and high blood pressure? Blood pressure and heart disease High blood pressure causes heart disease and increases the risk of stroke. This is more likely to develop in people who have high blood pressure readings or are overweight. Talk with your health care provider about your target blood pressure readings. Have your blood pressure checked: Every 3-5 years if you are 90-1 years of age. Every year if you are 42 years old or older. If you are between the ages of 47 and 53 and are a current or former smoker, ask your health care provider if you should have a one-time screening for abdominal aortic aneurysm (AAA). Diabetes Have regular diabetes screenings. This checks your fasting blood sugar level. Have the screening done: Once every three years after age 80 if you are at a normal weight and have a low risk for diabetes. More often and at a younger age if you are overweight or have a high risk for diabetes. What should I know about preventing infection? Hepatitis B If you have a higher risk for hepatitis B, you should be screened for this virus. Talk with your health care provider to find out if you are at risk for hepatitis B infection. Hepatitis C Blood testing is recommended for: Everyone born from [birth year] through 1963. Anyone with known  risk factors for hepatitis C. Sexually transmitted infections (STIs) You should be screened each year for STIs, including gonorrhea and chlamydia, if: You are sexually active and are younger than 60 years of age. You are older than 60 years of age and your health care provider tells you that you are at risk for this type of infection. Your sexual activity has changed since you were last screened, and you are at increased risk  for chlamydia or gonorrhea. Ask your health care provider if you are at risk. Ask your health care provider about whether you are at high risk for HIV. Your health care provider may recommend a prescription medicine to help prevent HIV infection. If you choose to take medicine to prevent HIV, you should first get tested for HIV. You should then be tested every 3 months for as long as you are taking the medicine. Follow these instructions at home: Alcohol use Do not drink alcohol if your health care provider tells you not to drink. If you drink alcohol: Limit how much you have to 0-2 drinks a day. Know how much alcohol is in your drink. In the [place] one drink equals one 12 oz bottle of beer (355 mL), one 5 oz glass of wine (148 mL), or one 1 oz glass of hard liquor (44 mL). Lifestyle Do not use any products that contain nicotine or tobacco. These products include cigarettes, chewing tobacco, and vaping devices, such as e-cigarettes. If you need help quitting, ask your health care provider. Do not use street drugs. Do not share needles. Ask your health care provider for help if you need support or information about quitting drugs. General instructions Schedule regular health, dental, and eye exams. Stay current with your vaccines. Tell your health care provider if: You often feel depressed. You have ever been abused or do not feel safe at home. Summary Adopting a healthy lifestyle and getting preventive care are important in promoting health and wellness. Follow your health care provider's instructions about healthy diet, exercising, and getting tested or screened for diseases. Follow your health care provider's instructions on monitoring your cholesterol and blood pressure. This information is not intended to replace advice given to you by your health care provider. Make sure you discuss any questions you have with your health care provider. Document Revised: 08/04/2020 Document Reviewed:  08/04/2020 Elsevier Patient Education  2024 [employer].

## 2023-01-17 NOTE — Progress Notes (Unsigned)
Subjective:    Patient ID: Carlos Harrison, male    DOB: [birth year], 62 y.o.   MRN: [id number]     HPI Carlos Harrison is here for a physical exam and his chronic medical problems.   BP at home - 120-130's - occ over 140's   Medications and allergies reviewed with patient and updated if appropriate.  Current Outpatient Medications on File Prior to Visit  Medication Sig Dispense Refill   Blood Pressure Monitoring (OMRON 3 SERIES BP MONITOR) DEVI Use as directed 1 each 0   metoprolol succinate (TOPROL XL) 25 MG 24 hr tablet Take 1 tablet (25 mg total) by mouth daily. 90 tablet 3   rivaroxaban (XARELTO) 20 MG TABS tablet Take 1 tablet (20 mg total) by mouth daily with supper. 90 tablet 3   rosuvastatin (CRESTOR) 10 MG tablet Take 1 tablet (10 mg total) by mouth daily. 90 tablet 1   sacubitril-valsartan (ENTRESTO) 97-103 MG Take 1 tablet by mouth 2 (two) times daily. 60 tablet 11   spironolactone (ALDACTONE) 25 MG tablet Take 1/2 tablet (12.5 mg) by mouth daily. 45 tablet 3   tadalafil (CIALIS) 20 MG tablet Take 1/2-1 tablet (10-20 mg total) by mouth every other day as needed for erectile dysfunction. 10 tablet 11   No current facility-administered medications on file prior to visit.    Review of Systems  Constitutional:  Negative for fever.  Eyes:  Negative for visual disturbance.  Respiratory:  Negative for cough, shortness of breath and wheezing.   Cardiovascular:  Positive for chest pain (occ) and palpitations (occ). Negative for leg swelling.  Gastrointestinal:  Negative for abdominal pain, blood in stool, constipation and diarrhea. Anal bleeding: hemorrhoidal.      No gerd  Genitourinary:  Negative for difficulty urinating, dysuria and hematuria.  Musculoskeletal:  Positive for back pain (mild). Negative for arthralgias.  Skin:  Negative for rash.  Neurological:  Negative for light-headedness and headaches.  Psychiatric/Behavioral:  Negative for dysphoric mood. The patient is  not nervous/anxious.        Objective:   Vitals:   01/18/23 0906 01/18/23 0941  BP: (!) 140/82 134/72  Pulse: 95   Temp: 98.4 F (36.9 C)   SpO2: 99%    Filed Weights   01/18/23 0906  Weight: 219 lb (99.3 kg)   Body mass index is 27.37 kg/m.  BP Readings from Last 3 Encounters:  01/18/23 134/72  11/03/22 (!) 138/98  07/20/22 122/86    Wt Readings from Last 3 Encounters:  01/18/23 219 lb (99.3 kg)  11/03/22 216 lb (98 kg)  07/20/22 213 lb (96.6 kg)      Physical Exam Constitutional: He appears well-developed and well-nourished. No distress.  HENT:  Head: Normocephalic and atraumatic.  Right Ear: External ear normal.  Left Ear: External ear normal.  Normal ear canals and TM b/l  Mouth/Throat: Oropharynx is clear and moist. Eyes: Conjunctivae and EOM are normal.  Neck: Neck supple. No tracheal deviation present. No thyromegaly present.  No carotid bruit  Cardiovascular: Normal rate, regular rhythm, normal heart sounds and intact distal pulses.   No murmur heard.  No lower extremity edema. Pulmonary/Chest: Effort normal and breath sounds normal. No respiratory distress. He has no wheezes. He has no rales.  Abdominal: Soft. He exhibits no distension. There is no tenderness.  Genitourinary: deferred  Lymphadenopathy:   He has no cervical adenopathy.  Skin: Skin is warm and dry. He is not diaphoretic.  Psychiatric: He has a  normal mood and affect. His behavior is normal.         Assessment & Plan:   Physical exam: Screening blood work  ordered Exercise   active at work.  No other exercise - thinking about trying to walk more outside of work Weight  is ok Substance abuse   none   Reviewed recommended immunizations.   Health Maintenance  Topic Date Due   FOOT EXAM  Never done   OPHTHALMOLOGY EXAM  Never done   HEMOGLOBIN A1C  01/15/2023   COVID-19 Vaccine (1 - 2023-24 season) 02/03/2023 (Originally 11/28/2022)   Zoster Vaccines- Shingrix (1 of 2)  04/20/2023 (Originally 08/21/2012)   Diabetic kidney evaluation - eGFR measurement  07/16/2023   Diabetic kidney evaluation - Urine ACR  07/16/2023   Colonoscopy  08/04/2025   DTaP/Tdap/Td (3 - Td or Tdap) 12/31/2029   INFLUENZA VACCINE  Completed   Hepatitis C Screening  Completed   HIV Screening  Completed   HPV VACCINES  Aged Out     See Problem List for Assessment and Plan of chronic medical problems.

## 2023-01-18 ENCOUNTER — Ambulatory Visit: Payer: 59 | Admitting: Internal Medicine

## 2023-01-18 VITALS — BP 134/72 | HR 95 | Temp 98.4°F | Ht 75.0 in | Wt 219.0 lb

## 2023-01-18 DIAGNOSIS — Z0000 Encounter for general adult medical examination without abnormal findings: Secondary | ICD-10-CM

## 2023-01-18 DIAGNOSIS — I420 Dilated cardiomyopathy: Secondary | ICD-10-CM

## 2023-01-18 DIAGNOSIS — I10 Essential (primary) hypertension: Secondary | ICD-10-CM | POA: Diagnosis not present

## 2023-01-18 DIAGNOSIS — E7849 Other hyperlipidemia: Secondary | ICD-10-CM | POA: Diagnosis not present

## 2023-01-18 DIAGNOSIS — I4892 Unspecified atrial flutter: Secondary | ICD-10-CM | POA: Diagnosis not present

## 2023-01-18 DIAGNOSIS — E119 Type 2 diabetes mellitus without complications: Secondary | ICD-10-CM

## 2023-01-18 DIAGNOSIS — N529 Male erectile dysfunction, unspecified: Secondary | ICD-10-CM

## 2023-01-18 LAB — CBC WITH DIFFERENTIAL/PLATELET
Basophils Absolute: 0 10*3/uL (ref 0.0–0.1)
Basophils Relative: 0.4 % (ref 0.0–3.0)
Eosinophils Absolute: 0.1 10*3/uL (ref 0.0–0.7)
Eosinophils Relative: 4 % (ref 0.0–5.0)
HCT: 39.6 % (ref 39.0–52.0)
Hemoglobin: 12.9 g/dL — ABNORMAL LOW (ref 13.0–17.0)
Lymphocytes Relative: 44.7 % (ref 12.0–46.0)
Lymphs Abs: 1.5 10*3/uL (ref 0.7–4.0)
MCHC: 32.5 g/dL (ref 30.0–36.0)
MCV: 87.6 fL (ref 78.0–100.0)
Monocytes Absolute: 0.3 10*3/uL (ref 0.1–1.0)
Monocytes Relative: 8.2 % (ref 3.0–12.0)
Neutro Abs: 1.5 10*3/uL (ref 1.4–7.7)
Neutrophils Relative %: 42.7 % — ABNORMAL LOW (ref 43.0–77.0)
Platelets: 184 10*3/uL (ref 150.0–400.0)
RBC: 4.52 Mil/uL (ref 4.22–5.81)
RDW: 14.7 % (ref 11.5–15.5)
WBC: 3.4 10*3/uL — ABNORMAL LOW (ref 4.0–10.5)

## 2023-01-18 LAB — TSH: TSH: 3.57 u[IU]/mL (ref 0.35–5.50)

## 2023-01-18 LAB — COMPREHENSIVE METABOLIC PANEL
ALT: 15 U/L (ref 0–53)
AST: 22 U/L (ref 0–37)
Albumin: 4.4 g/dL (ref 3.5–5.2)
Alkaline Phosphatase: 46 U/L (ref 39–117)
BUN: 14 mg/dL (ref 6–23)
CO2: 29 meq/L (ref 19–32)
Calcium: 9.4 mg/dL (ref 8.4–10.5)
Chloride: 105 meq/L (ref 96–112)
Creatinine, Ser: 1.14 mg/dL (ref 0.40–1.50)
GFR: 69.95 mL/min (ref >60.00)
Glucose, Bld: 96 mg/dL (ref 70–99)
Potassium: 3.8 meq/L (ref 3.5–5.1)
Sodium: 141 meq/L (ref 135–145)
Total Bilirubin: 0.8 mg/dL (ref 0.2–1.2)
Total Protein: 7.6 g/dL (ref 6.0–8.3)

## 2023-01-18 LAB — LIPID PANEL
Cholesterol: 179 mg/dL (ref 0–200)
HDL: 54.1 mg/dL (ref >39.00)
LDL Cholesterol: 109 mg/dL — ABNORMAL HIGH (ref 0–99)
NonHDL: 125.16
Total CHOL/HDL Ratio: 3
Triglycerides: 79 mg/dL (ref 0.0–149.0)
VLDL: 15.8 mg/dL (ref 0.0–40.0)

## 2023-01-18 LAB — HEMOGLOBIN A1C: Hgb A1c MFr Bld: 6.2 % (ref 4.6–6.5)

## 2023-01-18 NOTE — Assessment & Plan Note (Signed)
Chronic Check lipid panel, CMP Continue crestor 10 mg daily Regular exercise and healthy diet encouraged  Lab Results  Component Value Date   LDLCALC 94 07/16/2022

## 2023-01-18 NOTE — Assessment & Plan Note (Signed)
Chronic Continue cialis 10-20 mg daily prn

## 2023-01-18 NOTE — Assessment & Plan Note (Addendum)
Chronic No symptoms Rate controlled Sees cardio On metoprolol xl 25 mg daily and xarelto 20 mg daily Cbc, cmp, tsh

## 2023-01-18 NOTE — Assessment & Plan Note (Addendum)
Chronic BP controlled-initially elevated and did improve on recheck Advised him to continue to monitor at home-discussed goal of ideally less than 130/80 Continue metoprolol xl 25 mg daily, entresto 97-103 mg bid, spironolactone 12.5 mg daily Cmp

## 2023-01-18 NOTE — Assessment & Plan Note (Signed)
Chronic °Following with cardio °On entresto, spironolactone, crestor, metoprolol xl °euvolemic °

## 2023-01-18 NOTE — Assessment & Plan Note (Signed)
Chronic   Lab Results  Component Value Date   HGBA1C 6.1 07/16/2022   Sugars controlled Check A1c Continue lifestyle control as long as sugars are controlled Stressed regular exercise, diabetic diet

## 2023-02-10 ENCOUNTER — Encounter: Payer: Self-pay | Admitting: Cardiovascular Disease

## 2023-03-19 ENCOUNTER — Encounter (HOSPITAL_BASED_OUTPATIENT_CLINIC_OR_DEPARTMENT_OTHER): Payer: Self-pay

## 2023-03-19 ENCOUNTER — Emergency Department (HOSPITAL_BASED_OUTPATIENT_CLINIC_OR_DEPARTMENT_OTHER): Payer: 59 | Admitting: Radiology

## 2023-03-19 ENCOUNTER — Emergency Department (HOSPITAL_BASED_OUTPATIENT_CLINIC_OR_DEPARTMENT_OTHER)
Admission: EM | Admit: 2023-03-19 | Discharge: 2023-03-19 | Disposition: A | Discharge status: Home / Self Care | Payer: 59 | Attending: Emergency Medicine | Admitting: Emergency Medicine

## 2023-03-19 ENCOUNTER — Other Ambulatory Visit: Payer: Self-pay

## 2023-03-19 DIAGNOSIS — M48061 Spinal stenosis, lumbar region without neurogenic claudication: Secondary | ICD-10-CM | POA: Diagnosis not present

## 2023-03-19 DIAGNOSIS — M5459 Other low back pain: Secondary | ICD-10-CM | POA: Diagnosis not present

## 2023-03-19 DIAGNOSIS — M545 Low back pain, unspecified: Secondary | ICD-10-CM | POA: Insufficient documentation

## 2023-03-19 DIAGNOSIS — M47816 Spondylosis without myelopathy or radiculopathy, lumbar region: Secondary | ICD-10-CM | POA: Diagnosis not present

## 2023-03-19 LAB — URINALYSIS, ROUTINE W REFLEX MICROSCOPIC
Bilirubin Urine: NEGATIVE
Glucose, UA: NEGATIVE mg/dL
Hgb urine dipstick: NEGATIVE
Ketones, ur: NEGATIVE mg/dL
Leukocytes,Ua: NEGATIVE
Nitrite: NEGATIVE
Protein, ur: NEGATIVE mg/dL
Specific Gravity, Urine: 1.01 (ref 1.005–1.030)
pH: 5.5 (ref 5.0–8.0)

## 2023-03-19 MED ORDER — HYDROCODONE-ACETAMINOPHEN 5-325 MG PO TABS: 1.0000 | ORAL_TABLET | ORAL | 0 refills | Status: DC | PRN

## 2023-03-19 MED ORDER — IBUPROFEN 600 MG PO TABS: 600.0000 mg | ORAL_TABLET | Freq: Four times a day (QID) | ORAL | 0 refills | Status: AC | PRN

## 2023-03-19 MED ORDER — METHOCARBAMOL 750 MG PO TABS: 750.0000 mg | ORAL_TABLET | Freq: Two times a day (BID) | ORAL | 0 refills | Status: AC

## 2023-03-19 MED ORDER — HYDROCODONE-ACETAMINOPHEN 5-325 MG PO TABS
1.0000 | ORAL_TABLET | Freq: Once | ORAL | Status: AC
Start: 2023-03-19 — End: 2023-03-19
  Administered 2023-03-19: 1 via ORAL
  Filled 2023-03-19: qty 1

## 2023-03-19 MED ORDER — IBUPROFEN 400 MG PO TABS
600.0000 mg | ORAL_TABLET | Freq: Once | ORAL | Status: AC
  Administered 2023-03-19: 600 mg via ORAL
  Filled 2023-03-19: qty 1

## 2023-03-19 MED ORDER — METHOCARBAMOL 500 MG PO TABS
750.0000 mg | ORAL_TABLET | Freq: Once | ORAL | Status: AC
  Administered 2023-03-19: 750 mg via ORAL
  Filled 2023-03-19: qty 2

## 2023-03-19 NOTE — ED Provider Notes (Signed)
[place] EMERGENCY DEPARTMENT AT [facility] Provider Note   CSN: [id number] Arrival date & time: 03/19/23  1608     History  Chief Complaint  Patient presents with   Back Pain    lower    Carlos Harrison is a 62 y.o. male.  Patient to ED with complaint of progressively worsening lower right back pain for 2 weeks. Works as a Database administrator. The pain does not radiate into the buttock or leg. No lower extremity weakness. No bowel/bladder dysfunction. Pain is worse with movement and better with rest.   The history is provided by the patient. No language interpreter was used.  Back Pain      Home Medications Prior to Admission medications   Medication Sig Start Date End Date Taking? Authorizing Provider  HYDROcodone-acetaminophen (NORCO/VICODIN) 5-325 MG tablet Take 1 tablet by mouth every 4 (four) hours as needed. 03/19/23  Yes Jackie Russman, Melvenia Beam, [name]  ibuprofen (ADVIL) 600 MG tablet Take 1 tablet (600 mg total) by mouth every 6 (six) hours as needed. 03/19/23  Yes Malikye Reppond, Melvenia Beam, [name]  methocarbamol (ROBAXIN) 750 MG tablet Take 1 tablet (750 mg total) by mouth 2 (two) times daily. 03/19/23  Yes Elpidio Anis, [name]  Blood Pressure Monitoring (OMRON 3 SERIES BP MONITOR) DEVI Use as directed 11/10/22     metoprolol succinate (TOPROL XL) 25 MG 24 hr tablet Take 1 tablet (25 mg total) by mouth daily. 06/29/22   Wendall Stade, MD  rivaroxaban (XARELTO) 20 MG TABS tablet Take 1 tablet (20 mg total) by mouth daily with supper. 08/24/22   Wendall Stade, MD  rosuvastatin (CRESTOR) 10 MG tablet Take 1 tablet (10 mg total) by mouth daily. 08/16/22   Corky Crafts, MD  sacubitril-valsartan (ENTRESTO) 97-103 MG Take 1 tablet by mouth 2 (two) times daily. 12/10/22   Wendall Stade, MD  spironolactone (ALDACTONE) 25 MG tablet Take 1/2 tablet (12.5 mg) by mouth daily. 12/09/22   Pincus Sanes, MD  tadalafil (CIALIS) 20 MG tablet Take 1/2-1 tablet (10-20 mg total) by mouth every  other day as needed for erectile dysfunction. 10/01/22   Pincus Sanes, MD      Allergies    Patient has no known allergies.    Review of Systems   Review of Systems  Musculoskeletal:  Positive for back pain.    Physical Exam Updated Vital Signs BP (!) 137/94 (BP Location: Right Arm)   Pulse (!) 54   Temp 98.2 F (36.8 C) (Oral)   Resp 18   SpO2 100%  Physical Exam Vitals and nursing note reviewed.  Constitutional:      Appearance: He is well-developed.  Pulmonary:     Effort: Pulmonary effort is normal.  Abdominal:     Palpations: Abdomen is soft. There is no mass.     Tenderness: There is no abdominal tenderness.  Musculoskeletal:        General: Normal range of motion.     Cervical back: Normal range of motion.     Comments: Right paralumbar tenderness without swelling, discoloration. No sciatic tenderness on right. Distal pulses 2+.  Skin:    General: Skin is warm and dry.  Neurological:     Mental Status: He is alert and oriented to person, place, and time.     Sensory: No sensory deficit.     Deep Tendon Reflexes: Reflexes are normal and symmetric.     ED Results / Procedures / Treatments   Labs (all  labs ordered are listed, but only abnormal results are displayed) Labs Reviewed  URINALYSIS, ROUTINE W REFLEX MICROSCOPIC - Abnormal; Notable for the following components:      Result Value   Color, Urine COLORLESS (*)    All other components within normal limits   Results for orders placed or performed during the hospital encounter of 03/19/23  Urinalysis, Routine w reflex microscopic -Urine, Clean Catch   Collection Time: 03/19/23  7:12 PM  Result Value Ref Range   Color, Urine COLORLESS (A) YELLOW   APPearance CLEAR CLEAR   Specific Gravity, Urine 1.010 1.005 - 1.030   pH 5.5 5.0 - 8.0   Glucose, UA NEGATIVE NEGATIVE mg/dL   Hgb urine dipstick NEGATIVE NEGATIVE   Bilirubin Urine NEGATIVE NEGATIVE   Ketones, ur NEGATIVE NEGATIVE mg/dL   Protein, ur  NEGATIVE NEGATIVE mg/dL   Nitrite NEGATIVE NEGATIVE   Leukocytes,Ua NEGATIVE NEGATIVE    EKG None  Radiology DG Lumbar Spine Complete Result Date: 03/19/2023 CLINICAL DATA:  Low back pain. EXAM: LUMBAR SPINE - COMPLETE 4+ VIEW COMPARISON:  Lumbar spine radiograph dated 04/16/2005. FINDINGS: Five lumbar type vertebra. There is no acute fracture or subluxation of the lumbar spine. There multilevel degenerative changes with disc space narrowing and spurring. Lower lumbar facet arthropathy. The visualized posterior elements are intact. The soft tissues are unremarkable. IMPRESSION: 1. No acute findings. 2. Multilevel degenerative changes. Electronically Signed   By: Elgie Collard M.D.   On: 03/19/2023 16:45    Procedures Procedures    Medications Ordered in ED Medications  methocarbamol (ROBAXIN) tablet 750 mg (750 mg Oral Given 03/19/23 1927)  ibuprofen (ADVIL) tablet 600 mg (600 mg Oral Given 03/19/23 1927)  HYDROcodone-acetaminophen (NORCO/VICODIN) 5-325 MG per tablet 1 tablet (1 tablet Oral Given 03/19/23 1927)    ED Course/ Medical Decision Making/ A&P Clinical Course as of 03/19/23 2024  Sat Mar 19, 2023  2023 Low right back pain progressively worsening. Worse with movement, better with rest. No sciatica. No neurologic red flags. C/W MSK back pain requiring supportive care. PCP follow up for persistent symptoms. Discussed return precautions.  [SU]    Clinical Course User Index [SU] Elpidio Anis, [name]                                 Medical Decision Making Amount and/or Complexity of Data Reviewed Labs: ordered. Radiology: ordered.  Risk Prescription drug management.           Final Clinical Impression(s) / ED Diagnoses Final diagnoses:  Acute right-sided low back pain without sciatica    Rx / DC Orders ED Discharge Orders          Ordered    HYDROcodone-acetaminophen (NORCO/VICODIN) 5-325 MG tablet  Every 4 hours PRN        03/19/23 2021     ibuprofen (ADVIL) 600 MG tablet  Every 6 hours PRN        03/19/23 2021    methocarbamol (ROBAXIN) 750 MG tablet  2 times daily        03/19/23 2021              Elpidio Anis, [name] 03/19/23 2024    Royanne Foots, DO 03/21/23 0025

## 2023-03-19 NOTE — ED Triage Notes (Signed)
Pt c/o low back pain x1-1.5wks, maybe 2wks. 10/10 aching soreness, "sort of like a twist, worse w movement." Denies urinary symptoms

## 2023-03-19 NOTE — Discharge Instructions (Signed)
Rest the muscles of the back over the next several days. Take medications as prescribed. Follow up with your doctor if symptoms persist and return to the ED with any new or worsening symptoms at any time.

## 2023-03-21 ENCOUNTER — Encounter: Payer: Self-pay | Admitting: Family Medicine

## 2023-03-21 ENCOUNTER — Ambulatory Visit: Payer: 59 | Admitting: Family Medicine

## 2023-03-21 VITALS — BP 136/90 | HR 66 | Temp 97.8°F | Ht 75.0 in | Wt 218.0 lb

## 2023-03-21 DIAGNOSIS — M549 Dorsalgia, unspecified: Secondary | ICD-10-CM | POA: Diagnosis not present

## 2023-03-21 NOTE — Progress Notes (Signed)
Assessment & Plan:  1. Musculoskeletal back pain (Primary) Offered a referral to physical therapy which the patient declined. He was agreeable to home exercises which were provided to him. Encouraged Biofreeze, heating pad, Ibuprofen, and Robaxin. 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    Follow up plan: Return if symptoms worsen or fail to improve.  Deliah Boston, MSN, APRN, FNP-C  Subjective:  HPI: Carlos Harrison is a 62 y.o. male presenting on 03/21/2023 for Back Pain (Follow up from ER - [facility] 12/21 for right side acute back pain )  Patient is accompanied by his wife, who he is okay with being present.  Patient was seen at [facility] on 12/21 for right-sided acute low back pain x 2 weeks.  Pain was felt to be musculoskeletal and he was treated with hydrocodone, methocarbamol, and ibuprofen. He reports today the pain is okay; yesterday he was feeling it a lot. Pain worsens with certain movements or if he moves his feet certain ways or too far apart. He has a new recliner which he feels is worsening the pain as it is too soft. He has not been taking the Ibuprofen.     ROS: Negative unless specifically indicated above in HPI.   Relevant past medical history reviewed and updated as indicated.   Allergies and medications reviewed and updated.   Current Outpatient Medications:    HYDROcodone-acetaminophen (NORCO/VICODIN) 5-325 MG tablet, Take 1 tablet by mouth every 4 (four) hours as needed., Disp: 12 tablet, Rfl: 0   ibuprofen (ADVIL) 600 MG tablet, Take 1 tablet (600 mg total) by mouth every 6 (six) hours as needed., Disp: 30 tablet, Rfl: 0   methocarbamol (ROBAXIN) 750 MG tablet, Take 1 tablet (750 mg total) by mouth 2 (two) times daily., Disp: 20 tablet, Rfl: 0   metoprolol succinate (TOPROL XL) 25 MG 24 hr tablet, Take 1 tablet (25 mg total) by mouth daily., Disp: 90 tablet, Rfl: 3   rivaroxaban (XARELTO) 20 MG TABS tablet, Take 1 tablet (20 mg  total) by mouth daily with supper., Disp: 90 tablet, Rfl: 3   rosuvastatin (CRESTOR) 10 MG tablet, Take 1 tablet (10 mg total) by mouth daily., Disp: 90 tablet, Rfl: 1   sacubitril-valsartan (ENTRESTO) 97-103 MG, Take 1 tablet by mouth 2 (two) times daily., Disp: 60 tablet, Rfl: 11   spironolactone (ALDACTONE) 25 MG tablet, Take 1/2 tablet (12.5 mg) by mouth daily., Disp: 45 tablet, Rfl: 3   tadalafil (CIALIS) 20 MG tablet, Take 1/2-1 tablet (10-20 mg total) by mouth every other day as needed for erectile dysfunction., Disp: 10 tablet, Rfl: 11  No Known Allergies  Objective:   BP (!) 136/90   Pulse 66   Temp 97.8 F (36.6 C)   Ht 6\' 3"  (1.905 m)   Wt 218 lb (98.9 kg)   BMI 27.25 kg/m    Physical Exam Vitals reviewed.  Constitutional:      General: He is not in acute distress.    Appearance: Normal appearance. He is not ill-appearing, toxic-appearing or diaphoretic.  HENT:     Head: Normocephalic and atraumatic.  Eyes:     General: No scleral icterus.       Right eye: No discharge.        Left eye: No discharge.     Conjunctiva/sclera: Conjunctivae normal.  Cardiovascular:     Rate and Rhythm: Normal rate.  Pulmonary:     Effort: Pulmonary effort is normal. No respiratory  distress.  Musculoskeletal:        General: Normal range of motion.     Cervical back: Normal range of motion.     Lumbar back: Tenderness (muscular on the right side) present. No swelling, edema, deformity, signs of trauma, lacerations or bony tenderness.  Skin:    General: Skin is warm and dry.  Neurological:     Mental Status: He is alert and oriented to person, place, and time. Mental status is at baseline.  Psychiatric:        Mood and Affect: Mood normal.        Behavior: Behavior normal.        Thought Content: Thought content normal.        Judgment: Judgment normal.

## 2023-03-21 NOTE — Patient Instructions (Signed)
Biofreeze Heating pad Ibuprofen Robaxin

## 2023-03-30 ENCOUNTER — Other Ambulatory Visit: Payer: Self-pay | Admitting: Interventional Cardiology

## 2023-03-30 DIAGNOSIS — E7849 Other hyperlipidemia: Secondary | ICD-10-CM

## 2023-03-31 ENCOUNTER — Other Ambulatory Visit (HOSPITAL_COMMUNITY): Payer: Self-pay

## 2023-03-31 MED ORDER — ROSUVASTATIN CALCIUM 10 MG PO TABS
10.0000 mg | ORAL_TABLET | Freq: Every day | ORAL | 2 refills | Status: DC
Start: 2023-03-31 — End: 2023-12-26
  Filled 2023-03-31: qty 90, 90d supply, fill #0
  Filled 2023-06-30: qty 90, 90d supply, fill #1
  Filled 2023-09-27: qty 90, 90d supply, fill #2

## 2023-04-05 ENCOUNTER — Other Ambulatory Visit (HOSPITAL_COMMUNITY): Payer: Self-pay

## 2023-05-06 NOTE — Progress Notes (Signed)
 Evaluation Performed:  Follow-up visit  Date:  05/18/2023   ID:  Carlos Harrison, DOB 1960/12/27, MRN [id number]   PCP:  Pincus Sanes, MD  Cardiologist:  Charlton Haws, MD   Electrophysiologist:  None   Chief Complaint:  PAF/CHF  History of Present Illness:    63 y.o. HTN, HLD, PAF and chronic systolic CHF. 11/15/17 admitted with rapid afib/flutter. TEE/DCC 11/16/17 EF during arrhythmia 15-20%Improved since [facility].  Anticoagulation with normal dose xarelto.    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 [employer]  Tends toward bradycardia and his Toprol was decreased to 25 mg and entresto increased to 97/103 mg on 05/26/21  TTE 11/22/22  EF 55-60% mild LVH and no significant valve dx   No cardiac issues  Compliant with meds    Past Medical History:  Diagnosis Date   Atrial fibrillation (HCC) 10/2017   Cardiomyopathy (HCC) 11/10/2017   Dyspnea    Hyperlipidemia    Hypertension    Palpitations 10/26/2017   Past Surgical History:  Procedure Laterality Date   CARDIOVERSION N/A 11/16/2017   Procedure: CARDIOVERSION;  Surgeon: Nahser, Deloris Ping, MD;  Location: [facility] ENDOSCOPY;  Service: Cardiovascular;  Laterality: N/A;   EYE SURGERY Left    age 12 or 75   TEE WITH CARDIOVERSION  11/16/2017   TEE WITHOUT CARDIOVERSION N/A 11/16/2017   Procedure: TRANSESOPHAGEAL ECHOCARDIOGRAM (TEE);  Surgeon: Elease Hashimoto Deloris Ping, MD;  Location: [facility] ENDOSCOPY;  Service: Cardiovascular;  Laterality: N/A;     Current Meds  Medication Sig   HYDROcodone-acetaminophen (NORCO/VICODIN) 5-325 MG tablet Take 1 tablet by mouth every 4 (four) hours as needed.   ibuprofen (ADVIL) 600 MG tablet Take 1 tablet (600 mg total) by mouth every 6 (six) hours as needed.   methocarbamol (ROBAXIN) 750 MG  tablet Take 1 tablet (750 mg total) by mouth 2 (two) times daily.   metoprolol succinate (TOPROL XL) 25 MG 24 hr tablet Take 1 tablet (25 mg total) by mouth daily.   rivaroxaban (XARELTO) 20 MG TABS tablet Take 1 tablet (20 mg total) by mouth daily with supper.   rosuvastatin (CRESTOR) 10 MG tablet Take 1 tablet (10 mg total) by mouth daily.   sacubitril-valsartan (ENTRESTO) 97-103 MG Take 1 tablet by mouth 2 (two) times daily.   spironolactone (ALDACTONE) 25 MG tablet Take 1/2 tablet (12.5 mg) by mouth daily.   tadalafil (CIALIS) 20 MG tablet Take 1/2-1 tablet (10-20 mg total) by mouth every other day as needed for erectile dysfunction.     Allergies:   Patient has no known allergies.   Social History   Tobacco Use   Smoking status: Never   Smokeless tobacco: Never  Vaping Use   Vaping status: Never Used  Substance Use Topics   Alcohol use: No   Drug use: No     Family Hx: The patient's family history includes Cirrhosis in his brother; Diabetes in his brother; Heart disease in his brother and brother; Hypertension in his son; Ovarian cancer in his mother; Premature birth in his daughter; Seizures in his daughter.  ROS:   Please see the history of present illness.     All other systems reviewed and are negative.   Prior CV studies:   The following studies were  reviewed today:  Echo  11/22/22  IMPRESSIONS     1. Left ventricular ejection fraction, by estimation, is 55 to 60%. Left  ventricular ejection fraction by 3D volume is 58 %. The left ventricle has  normal function. The left ventricle has no regional wall motion  abnormalities. There is mild left  ventricular hypertrophy. Left ventricular diastolic parameters were  normal. The average left ventricular global longitudinal strain is -15.0  %. The global longitudinal strain is abnormal.   2. RV free wall strain normal at -26.1%, RVEF 40% (normal). Right  ventricular systolic function is normal. The right ventricular  size is  mildly enlarged.   3. The mitral valve is abnormal. Trivial mitral valve regurgitation.   4. The aortic valve is tricuspid. Aortic valve regurgitation is not  visualized.   Comparison(s): Changes from prior study are noted. 04/23/2020: LVEF 55-60%.    Labs/Other Tests and Data Reviewed:    EKG: SR rate 54 normal 01/23/18  05/18/2023 SR rate 63 normal 05/18/2023 NSR rate 53 normal   Recent Labs: 01/18/2023: ALT 15; BUN 14; Creatinine, Ser 1.14; Hemoglobin 12.9; Platelets 184.0; Potassium 3.8; Sodium 141; TSH 3.57   Recent Lipid Panel Lab Results  Component Value Date/Time   CHOL 179 01/18/2023 09:45 AM   TRIG 79.0 01/18/2023 09:45 AM   HDL 54.10 01/18/2023 09:45 AM   CHOLHDL 3 01/18/2023 09:45 AM   LDLCALC 109 (H) 01/18/2023 09:45 AM   LDLDIRECT 108.0 05/07/2020 11:32 AM    Wt Readings from Last 3 Encounters:  05/18/23 212 lb (96.2 kg)  03/21/23 218 lb (98.9 kg)  01/18/23 219 lb (99.3 kg)     Objective:    Vital Signs:  BP 134/84   Pulse 63   Ht 6' 2.5" (1.892 m)   Wt 212 lb (96.2 kg)   SpO2 98%   BMI 26.86 kg/m    Affect appropriate Healthy:  appears stated age HEENT: normal Neck supple with no adenopathy JVP normal no bruits no thyromegaly Lungs clear with no wheezing and good diaphragmatic motion Heart:  S1/S2 no murmur, no rub, gallop or click PMI normal Abdomen: benighn, BS positve, no tenderness, no AAA no bruit.  No HSM or HJR Distal pulses intact with no bruits No edema Neuro non-focal Skin warm and dry No muscular weakness    ASSESSMENT & PLAN:    PAF: maintaining NSR continue lower dose Toprol and anticoagulation ? Short run PAF August 2024  If continues can consider 30 day monitor In NSR today  DCM:  EF improved on goal directed Rx including enteresto EF 55-60% % by TTE 11/22/22 Cardiac CTA June 2020 with calcium score 0 and normal right dominant coronary arteries    HLD:  On statin labs with primary LDL 80 05/13/21 ok given calcium score  0 HTN:  will decrease sodium in diet. Monitor at home May need to increase aldactone to full tablet daily or add norvasc  COVID-19 Education: The signs and symptoms of COVID-19 were discussed with the patient and how to seek care for testing (follow up with PCP or arrange E-visit).  The importance of social distancing was discussed today.   Medication Adjustments/Labs and Tests Ordered:  None  F/U in a year   Signed, Charlton Haws, MD  05/18/2023 9:05 AM    [facility]

## 2023-05-18 ENCOUNTER — Ambulatory Visit: Payer: 59 | Attending: Cardiovascular Disease | Admitting: Cardiovascular Disease

## 2023-05-18 VITALS — BP 134/84 | HR 63 | Ht 74.5 in | Wt 212.0 lb

## 2023-05-18 DIAGNOSIS — I420 Dilated cardiomyopathy: Secondary | ICD-10-CM

## 2023-05-18 DIAGNOSIS — I480 Paroxysmal atrial fibrillation: Secondary | ICD-10-CM | POA: Diagnosis not present

## 2023-05-18 DIAGNOSIS — I10 Essential (primary) hypertension: Secondary | ICD-10-CM

## 2023-05-18 NOTE — Patient Instructions (Signed)

## 2023-05-26 DIAGNOSIS — H5213 Myopia, bilateral: Secondary | ICD-10-CM | POA: Diagnosis not present

## 2023-05-26 DIAGNOSIS — H52221 Regular astigmatism, right eye: Secondary | ICD-10-CM | POA: Diagnosis not present

## 2023-05-26 DIAGNOSIS — H524 Presbyopia: Secondary | ICD-10-CM | POA: Diagnosis not present

## 2023-05-27 ENCOUNTER — Encounter: Payer: Self-pay | Admitting: Internal Medicine

## 2023-06-20 ENCOUNTER — Other Ambulatory Visit: Payer: Self-pay | Admitting: Cardiovascular Disease

## 2023-06-21 ENCOUNTER — Other Ambulatory Visit (HOSPITAL_COMMUNITY): Payer: Self-pay

## 2023-06-21 MED ORDER — METOPROLOL SUCCINATE ER 25 MG PO TB24
25.0000 mg | ORAL_TABLET | Freq: Every day | ORAL | 3 refills | Status: AC
  Filled 2023-06-21: qty 90, 90d supply, fill #0
  Filled 2023-09-19: qty 90, 90d supply, fill #1
  Filled 2023-12-18: qty 90, 90d supply, fill #2
  Filled 2024-03-26: qty 90, 90d supply, fill #3

## 2023-07-14 NOTE — Progress Notes (Signed)
   Joanna Muck, PhD, LAT, ATC acting as a scribe for Carlos Juniper, MD.  NIC LAMPE is a 63 y.o. male who presents to [facility] today for R thumb pain. Pt is RHD. Pt was previously seen by Dr. Alease Hunter on 07/20/22 for R trigger thumb and it was injected.   Today, pt reports R thumb pain has returned over the last 2 wks. No injury. He notes limited thumb AROM and triggering. Difficulty putting on gloves at work. Pt locates pain to the IP joint of the R thumb.   Pertinent review of systems: no fever or chills  Relevant historical information: Hx prior Rt Trigger thumb   Exam:  BP 132/88   Pulse (!) 53   Ht 6' 2.5" (1.892 m)   Wt 217 lb (98.4 kg)   SpO2 97%   BMI 27.49 kg/m  General: Well Developed, well nourished, and in no acute distress.   MSK: Right Thumb: Swelling at MCP and IP. Decreased motion to flexion rt IP joint. Intact strength.    Lab and Radiology Results  Procedure: Real-time Ultrasound Guided Injection of tendon sheath right thumb A1 pulley (trigger thumb injection) Device: Philips Affiniti 50G/GE Logiq Images permanently stored and available for review in PACS Verbal informed consent obtained.  Discussed risks and benefits of procedure. Warned about infection, bleeding, hyperglycemia damage to structures among others. Patient expresses understanding and agreement Time-out conducted.   Noted no overlying erythema, induration, or other signs of local infection.   Skin prepped in a sterile fashion.   Local anesthesia: Topical Ethyl chloride.   With sterile technique and under real time ultrasound guidance: 40 mg of Kenalog and 1 mL of lidocaine  injected into tendon sheath right thumb A1 pulley. Fluid seen entering the tendon sheath.   Completed without difficulty   Pain immediately resolved suggesting accurate placement of the medication.   Advised to call if fevers/chills, erythema, induration, drainage, or persistent  bleeding.   Images permanently stored and available for review in the ultrasound unit.  Impression: Technically successful ultrasound guided injection.        Assessment and Plan: 63 y.o. male with right trigger thumb.  This is a recurrence of a chronic problem.  Previous injection was just about a year ago.  Plan for repeat injection today and double Band-Aid splint.  Return as needed.   PDMP not reviewed this encounter. Orders Placed This Encounter  Procedures   US  LIMITED JOINT SPACE STRUCTURES UP RIGHT(NO LINKED CHARGES)    Reason for Exam (SYMPTOM  OR DIAGNOSIS REQUIRED):   right thumb pain    Preferred imaging location?:   [facility]   No orders of the defined types were placed in this encounter.    Discussed warning signs or symptoms. Please see discharge instructions. Patient expresses understanding.   The above documentation has been reviewed and is accurate and complete Carlos Harrison, M.D.

## 2023-07-18 ENCOUNTER — Ambulatory Visit (INDEPENDENT_AMBULATORY_CARE_PROVIDER_SITE_OTHER): Admitting: Family Medicine

## 2023-07-18 ENCOUNTER — Other Ambulatory Visit: Payer: Self-pay

## 2023-07-18 VITALS — BP 132/88 | HR 53 | Ht 74.5 in | Wt 217.0 lb

## 2023-07-18 DIAGNOSIS — M65311 Trigger thumb, right thumb: Secondary | ICD-10-CM | POA: Diagnosis not present

## 2023-07-18 NOTE — Patient Instructions (Signed)
Thank you for coming in today.   You received an injection today. Seek immediate medical attention if the joint becomes red, extremely painful, or is oozing fluid.   Use the double Band-aid splint  Check back as needed

## 2023-07-19 ENCOUNTER — Encounter: Payer: Self-pay | Admitting: Internal Medicine

## 2023-07-19 NOTE — Progress Notes (Unsigned)
 Subjective:    Patient ID: Carlos Harrison, male    DOB: 11-20-1960, 63 y.o.   MRN: [id number]     HPI Carlos Harrison is here for follow up of his chronic medical problems.  Cialis  is not working as well.    Active - no regular exercise.   He is compliant with a low sugar/salt diet.     Medications and allergies reviewed with patient and updated if appropriate.  Current Outpatient Medications on File Prior to Visit  Medication Sig Dispense Refill   ibuprofen  (ADVIL ) 600 MG tablet Take 1 tablet (600 mg total) by mouth every 6 (six) hours as needed. 30 tablet 0   methocarbamol  (ROBAXIN ) 750 MG tablet Take 1 tablet (750 mg total) by mouth 2 (two) times daily. 20 tablet 0   metoprolol  succinate (TOPROL  XL) 25 MG 24 hr tablet Take 1 tablet (25 mg total) by mouth daily. 90 tablet 3   rivaroxaban  (XARELTO ) 20 MG TABS tablet Take 1 tablet (20 mg total) by mouth daily with supper. 90 tablet 3   rosuvastatin  (CRESTOR ) 10 MG tablet Take 1 tablet (10 mg total) by mouth daily. 90 tablet 2   sacubitril -valsartan  (ENTRESTO ) 97-103 MG Take 1 tablet by mouth 2 (two) times daily. 60 tablet 11   spironolactone  (ALDACTONE ) 25 MG tablet Take 1/2 tablet (12.5 mg) by mouth daily. 45 tablet 3   tadalafil  (CIALIS ) 20 MG tablet Take 1/2-1 tablet (10-20 mg total) by mouth every other day as needed for erectile dysfunction. 10 tablet 11   No current facility-administered medications on file prior to visit.     Review of Systems  Constitutional:  Negative for fever.  Respiratory:  Positive for shortness of breath (once with strenuous activity). Negative for cough and wheezing.   Cardiovascular:  Negative for chest pain, palpitations and leg swelling.  Neurological:  Positive for light-headedness (a few episodes - when he took his meds a little later). Negative for headaches.       Objective:   Vitals:   07/20/23 0750  BP: 136/80  Pulse: 60  Temp: 98.2 F (36.8 C)  SpO2: 100%   BP Readings from  Last 3 Encounters:  07/20/23 136/80  07/18/23 132/88  05/18/23 134/84   Wt Readings from Last 3 Encounters:  07/20/23 216 lb (98 kg)  07/18/23 217 lb (98.4 kg)  05/18/23 212 lb (96.2 kg)   Body mass index is 27.36 kg/m.    Physical Exam Constitutional:      General: He is not in acute distress.    Appearance: Normal appearance. He is not ill-appearing.  HENT:     Head: Normocephalic and atraumatic.  Eyes:     Conjunctiva/sclera: Conjunctivae normal.  Cardiovascular:     Rate and Rhythm: Normal rate and regular rhythm.     Heart sounds: Normal heart sounds.  Pulmonary:     Effort: Pulmonary effort is normal. No respiratory distress.     Breath sounds: Normal breath sounds. No wheezing or rales.  Musculoskeletal:     Right lower leg: No edema.     Left lower leg: No edema.  Skin:    General: Skin is warm and dry.     Findings: No rash.  Neurological:     Mental Status: He is alert. Mental status is at baseline.  Psychiatric:        Mood and Affect: Mood normal.        Lab Results  Component Value Date  WBC 3.4 (L) 01/18/2023   HGB 12.9 (L) 01/18/2023   HCT 39.6 01/18/2023   PLT 184.0 01/18/2023   GLUCOSE 96 01/18/2023   CHOL 179 01/18/2023   TRIG 79.0 01/18/2023   HDL 54.10 01/18/2023   LDLDIRECT 108.0 05/07/2020   LDLCALC 109 (H) 01/18/2023   ALT 15 01/18/2023   AST 22 01/18/2023   NA 141 01/18/2023   K 3.8 01/18/2023   CL 105 01/18/2023   CREATININE 1.14 01/18/2023   BUN 14 01/18/2023   CO2 29 01/18/2023   TSH 3.57 01/18/2023   PSA 0.94 07/16/2022   HGBA1C 6.2 01/18/2023   MICROALBUR 1.2 07/16/2022     Assessment & Plan:    See Problem List for Assessment and Plan of chronic medical problems.

## 2023-07-19 NOTE — Patient Instructions (Addendum)
      Blood work was ordered.       Medications changes include :   None    A referral was ordered and someone will call you to schedule an appointment.     Return in about 6 months (around 01/19/2024) for Physical Exam.

## 2023-07-20 ENCOUNTER — Other Ambulatory Visit: Payer: Self-pay

## 2023-07-20 ENCOUNTER — Ambulatory Visit: Payer: 59 | Admitting: Internal Medicine

## 2023-07-20 ENCOUNTER — Other Ambulatory Visit (HOSPITAL_COMMUNITY): Payer: Self-pay

## 2023-07-20 ENCOUNTER — Encounter: Payer: Self-pay | Admitting: Internal Medicine

## 2023-07-20 VITALS — BP 136/80 | HR 60 | Temp 98.2°F | Ht 74.5 in | Wt 216.0 lb

## 2023-07-20 DIAGNOSIS — I420 Dilated cardiomyopathy: Secondary | ICD-10-CM

## 2023-07-20 DIAGNOSIS — E7849 Other hyperlipidemia: Secondary | ICD-10-CM | POA: Diagnosis not present

## 2023-07-20 DIAGNOSIS — E119 Type 2 diabetes mellitus without complications: Secondary | ICD-10-CM | POA: Diagnosis not present

## 2023-07-20 DIAGNOSIS — N529 Male erectile dysfunction, unspecified: Secondary | ICD-10-CM | POA: Diagnosis not present

## 2023-07-20 DIAGNOSIS — I10 Essential (primary) hypertension: Secondary | ICD-10-CM | POA: Diagnosis not present

## 2023-07-20 DIAGNOSIS — I4892 Unspecified atrial flutter: Secondary | ICD-10-CM

## 2023-07-20 MED ORDER — SILDENAFIL CITRATE 100 MG PO TABS
50.0000 mg | ORAL_TABLET | Freq: Every day | ORAL | 11 refills | Status: AC | PRN
  Filled 2023-07-20: qty 5, 25d supply, fill #0
  Filled 2024-03-05: qty 5, 25d supply, fill #1

## 2023-07-20 MED ORDER — SPIRONOLACTONE 25 MG PO TABS
25.0000 mg | ORAL_TABLET | Freq: Every day | ORAL | 3 refills | Status: DC
  Filled 2023-07-20 – 2023-08-16 (×7): qty 90, 90d supply, fill #0

## 2023-07-20 NOTE — Assessment & Plan Note (Signed)
 Chronic Check lipid panel, CMP Continue crestor  10 mg daily Regular exercise and healthy diet encouraged  Lab Results  Component Value Date   LDLCALC 109 (H) 01/18/2023

## 2023-07-20 NOTE — Assessment & Plan Note (Signed)
Chronic °Following with cardio °On entresto, spironolactone, crestor, metoprolol xl °euvolemic °

## 2023-07-20 NOTE — Assessment & Plan Note (Addendum)
 Chronic  Lab Results  Component Value Date   HGBA1C 6.2 01/18/2023   Sugars controlled Check A1c, urine regular albumin/creatinine ratio Continue lifestyle control as long as sugars are controlled Stressed regular exercise, diabetic diet-discussed lifestyle is what will help keep him off of medication

## 2023-07-20 NOTE — Assessment & Plan Note (Signed)
 Chronic Asymptomatic Rate controlled Sees cardio On metoprolol  xl 25 mg daily and xarelto  20 mg daily Cbc, cmp

## 2023-07-20 NOTE — Assessment & Plan Note (Addendum)
 Chronic BP borderline controlled Continue metoprolol  xl 25 mg daily, entresto  97-103 mg bid Increase spironolactone  to 25 mg daily Cmp, cbc-will have this done in 1-2 weeks.

## 2023-07-20 NOTE — Assessment & Plan Note (Addendum)
 Chronic Taking cialis  10-20 mg daily prn - not working that well Trial of viagra  50-100 mg prn

## 2023-08-02 ENCOUNTER — Other Ambulatory Visit (INDEPENDENT_AMBULATORY_CARE_PROVIDER_SITE_OTHER)

## 2023-08-02 ENCOUNTER — Other Ambulatory Visit: Payer: Self-pay

## 2023-08-02 ENCOUNTER — Other Ambulatory Visit (HOSPITAL_COMMUNITY): Payer: Self-pay

## 2023-08-02 ENCOUNTER — Other Ambulatory Visit: Payer: Self-pay | Admitting: Cardiovascular Disease

## 2023-08-02 DIAGNOSIS — I10 Essential (primary) hypertension: Secondary | ICD-10-CM | POA: Diagnosis not present

## 2023-08-02 DIAGNOSIS — E7849 Other hyperlipidemia: Secondary | ICD-10-CM

## 2023-08-02 DIAGNOSIS — E119 Type 2 diabetes mellitus without complications: Secondary | ICD-10-CM

## 2023-08-02 LAB — CBC WITH DIFFERENTIAL/PLATELET
Basophils Absolute: 0 10*3/uL (ref 0.0–0.1)
Basophils Relative: 0.3 % (ref 0.0–3.0)
Eosinophils Absolute: 0.1 10*3/uL (ref 0.0–0.7)
Eosinophils Relative: 3.6 % (ref 0.0–5.0)
HCT: 39.9 % (ref 39.0–52.0)
Hemoglobin: 13.2 g/dL (ref 13.0–17.0)
Lymphocytes Relative: 44.4 % (ref 12.0–46.0)
Lymphs Abs: 1.6 10*3/uL (ref 0.7–4.0)
MCHC: 33 g/dL (ref 30.0–36.0)
MCV: 87.3 fl (ref 78.0–100.0)
Monocytes Absolute: 0.2 10*3/uL (ref 0.1–1.0)
Monocytes Relative: 6.1 % (ref 3.0–12.0)
Neutro Abs: 1.6 10*3/uL (ref 1.4–7.7)
Neutrophils Relative %: 45.6 % (ref 43.0–77.0)
Platelets: 180 10*3/uL (ref 150.0–400.0)
RBC: 4.57 Mil/uL (ref 4.22–5.81)
RDW: 14.1 % (ref 11.5–15.5)
WBC: 3.5 10*3/uL — ABNORMAL LOW (ref 4.0–10.5)

## 2023-08-02 LAB — COMPREHENSIVE METABOLIC PANEL WITH GFR
ALT: 18 U/L (ref 0–53)
AST: 21 U/L (ref 0–37)
Albumin: 4.2 g/dL (ref 3.5–5.2)
Alkaline Phosphatase: 38 U/L — ABNORMAL LOW (ref 39–117)
BUN: 14 mg/dL (ref 6–23)
CO2: 28 meq/L (ref 19–32)
Calcium: 9.4 mg/dL (ref 8.4–10.5)
Chloride: 104 meq/L (ref 96–112)
Creatinine, Ser: 1.18 mg/dL (ref 0.40–1.50)
GFR: 66.86 mL/min (ref >60.00)
Glucose, Bld: 97 mg/dL (ref 70–99)
Potassium: 4.3 meq/L (ref 3.5–5.1)
Sodium: 139 meq/L (ref 135–145)
Total Bilirubin: 0.7 mg/dL (ref 0.2–1.2)
Total Protein: 7.1 g/dL (ref 6.0–8.3)

## 2023-08-02 LAB — LIPID PANEL
Cholesterol: 174 mg/dL (ref 0–200)
HDL: 49.3 mg/dL (ref >39.00)
LDL Cholesterol: 110 mg/dL — ABNORMAL HIGH (ref 0–99)
NonHDL: 124.69
Total CHOL/HDL Ratio: 4
Triglycerides: 75 mg/dL (ref 0.0–149.0)
VLDL: 15 mg/dL (ref 0.0–40.0)

## 2023-08-02 LAB — MICROALBUMIN / CREATININE URINE RATIO
Creatinine,U: 211.2 mg/dL
Microalb Creat Ratio: 17.6 mg/g (ref 0.0–30.0)
Microalb, Ur: 3.7 mg/dL — ABNORMAL HIGH (ref 0.0–1.9)

## 2023-08-02 LAB — HEMOGLOBIN A1C: Hgb A1c MFr Bld: 6.6 % — ABNORMAL HIGH (ref 4.6–6.5)

## 2023-08-02 MED ORDER — RIVAROXABAN 20 MG PO TABS
20.0000 mg | ORAL_TABLET | Freq: Every day | ORAL | 2 refills | Status: DC
  Filled 2023-08-02 – 2023-08-05 (×2): qty 90, 90d supply, fill #0
  Filled 2023-10-28: qty 90, 90d supply, fill #1
  Filled 2024-01-26: qty 90, 90d supply, fill #2

## 2023-08-02 NOTE — Telephone Encounter (Signed)
 Pt last saw Dr Stann Earnest 05/18/23, last labs 08/02/23 Creat 1.18, age 63, weight 98kg, CrCl 92.28, based on CrCl pt is on appropriate dosage of Xarelto  20mg  every day.  Will refill rx.

## 2023-08-04 ENCOUNTER — Encounter: Payer: Self-pay | Admitting: Internal Medicine

## 2023-08-05 ENCOUNTER — Other Ambulatory Visit: Payer: Self-pay | Admitting: Cardiovascular Disease

## 2023-08-05 ENCOUNTER — Other Ambulatory Visit: Payer: Self-pay

## 2023-08-05 ENCOUNTER — Other Ambulatory Visit (HOSPITAL_COMMUNITY): Payer: Self-pay

## 2023-08-10 ENCOUNTER — Other Ambulatory Visit (HOSPITAL_COMMUNITY): Payer: Self-pay

## 2023-08-12 ENCOUNTER — Encounter (HOSPITAL_COMMUNITY): Payer: Self-pay

## 2023-08-15 ENCOUNTER — Other Ambulatory Visit (HOSPITAL_COMMUNITY): Payer: Self-pay

## 2023-08-16 ENCOUNTER — Encounter: Payer: Self-pay | Admitting: Internal Medicine

## 2023-08-16 ENCOUNTER — Other Ambulatory Visit (HOSPITAL_COMMUNITY): Payer: Self-pay

## 2023-08-16 MED ORDER — SPIRONOLACTONE 25 MG PO TABS: 12.5000 mg | ORAL_TABLET | Freq: Every day | ORAL | Status: DC

## 2023-08-17 ENCOUNTER — Other Ambulatory Visit (HOSPITAL_COMMUNITY): Payer: Self-pay

## 2023-08-18 ENCOUNTER — Encounter: Payer: Self-pay | Admitting: Internal Medicine

## 2023-08-18 ENCOUNTER — Other Ambulatory Visit: Payer: Self-pay

## 2023-08-18 ENCOUNTER — Other Ambulatory Visit (HOSPITAL_COMMUNITY): Payer: Self-pay

## 2023-08-18 MED ORDER — SPIRONOLACTONE 25 MG PO TABS
12.5000 mg | ORAL_TABLET | Freq: Every day | ORAL | 1 refills | Status: DC
  Filled 2023-08-18: qty 45, 90d supply, fill #0
  Filled 2023-11-03 – 2023-11-10 (×3): qty 45, 90d supply, fill #1
  Filled ????-??-?? (×2): fill #1

## 2023-10-01 ENCOUNTER — Other Ambulatory Visit (HOSPITAL_BASED_OUTPATIENT_CLINIC_OR_DEPARTMENT_OTHER): Payer: Self-pay

## 2023-11-03 ENCOUNTER — Other Ambulatory Visit (HOSPITAL_COMMUNITY): Payer: Self-pay

## 2023-11-03 ENCOUNTER — Encounter (HOSPITAL_COMMUNITY): Payer: Self-pay

## 2023-12-02 ENCOUNTER — Other Ambulatory Visit: Payer: Self-pay | Admitting: Cardiovascular Disease

## 2023-12-02 ENCOUNTER — Other Ambulatory Visit (HOSPITAL_COMMUNITY): Payer: Self-pay

## 2023-12-02 MED ORDER — SACUBITRIL-VALSARTAN 97-103 MG PO TABS
1.0000 | ORAL_TABLET | Freq: Two times a day (BID) | ORAL | 11 refills | Status: AC
  Filled 2023-12-02: qty 60, 30d supply, fill #0
  Filled 2023-12-30: qty 60, 30d supply, fill #1
  Filled 2024-01-29: qty 60, 30d supply, fill #2
  Filled 2024-03-05: qty 60, 30d supply, fill #3
  Filled 2024-03-31: qty 60, 30d supply, fill #4

## 2023-12-26 ENCOUNTER — Other Ambulatory Visit (HOSPITAL_COMMUNITY): Payer: Self-pay

## 2023-12-26 ENCOUNTER — Other Ambulatory Visit: Payer: Self-pay | Admitting: Cardiovascular Disease

## 2023-12-26 DIAGNOSIS — E7849 Other hyperlipidemia: Secondary | ICD-10-CM

## 2023-12-26 MED ORDER — ROSUVASTATIN CALCIUM 10 MG PO TABS
10.0000 mg | ORAL_TABLET | Freq: Every day | ORAL | 2 refills | Status: AC
  Filled 2023-12-26: qty 90, 90d supply, fill #0
  Filled 2024-03-29: qty 90, 90d supply, fill #1

## 2023-12-28 ENCOUNTER — Other Ambulatory Visit: Payer: Self-pay

## 2023-12-30 ENCOUNTER — Other Ambulatory Visit (HOSPITAL_COMMUNITY): Payer: Self-pay

## 2024-01-18 NOTE — Patient Instructions (Addendum)
      Blood work was ordered.       Medications changes include :   hydrocortisone  2.5 % cream for hemorrhodis      Return in about 6 months (around 07/19/2024) for Physical Exam.

## 2024-01-18 NOTE — Progress Notes (Unsigned)
 Subjective:    Patient ID: Carlos Harrison, male    DOB: 08/25/1960, 63 y.o.   MRN: [id number]     HPI Carlos Harrison is here for follow up of his chronic medical problems.  BP 118/80's when he first woke up - later in the day 135/?.    Compliant with not adding sugar to things.  He is working on decreasing sugar overall.  No regular exercise-  but active at work.     Medications and allergies reviewed with patient and updated if appropriate.  Current Outpatient Medications on File Prior to Visit  Medication Sig Dispense Refill   ibuprofen  (ADVIL ) 600 MG tablet Take 1 tablet (600 mg total) by mouth every 6 (six) hours as needed. 30 tablet 0   methocarbamol  (ROBAXIN ) 750 MG tablet Take 1 tablet (750 mg total) by mouth 2 (two) times daily. 20 tablet 0   metoprolol  succinate (TOPROL  XL) 25 MG 24 hr tablet Take 1 tablet (25 mg total) by mouth daily. 90 tablet 3   rivaroxaban  (XARELTO ) 20 MG TABS tablet Take 1 tablet (20 mg total) by mouth daily with supper. 90 tablet 2   rosuvastatin  (CRESTOR ) 10 MG tablet Take 1 tablet (10 mg total) by mouth daily. 90 tablet 2   sacubitril -valsartan  (ENTRESTO ) 97-103 MG Take 1 tablet by mouth 2 (two) times daily. 60 tablet 11   sildenafil  (VIAGRA ) 100 MG tablet Take 1/2-1 tablet (50-100 mg total) by mouth daily as needed for erectile dysfunction. 5 tablet 11   spironolactone  (ALDACTONE ) 25 MG tablet Take 1/2 tablet (12.5 mg total) by mouth daily. 90 tablet 1   No current facility-administered medications on file prior to visit.     Review of Systems  Constitutional:  Negative for fever.  Respiratory:  Negative for cough, shortness of breath and wheezing.   Cardiovascular:  Positive for chest pain (chronic - occ chest pain- nothing new). Negative for palpitations and leg swelling.  Neurological:  Negative for light-headedness and headaches.       Objective:   Vitals:   01/19/24 0757  BP: 134/70  Pulse: (!) 58  Temp: 98.4 F (36.9 C)   SpO2: 97%   BP Readings from Last 3 Encounters:  01/19/24 134/70  07/20/23 136/80  07/18/23 132/88   Wt Readings from Last 3 Encounters:  01/19/24 221 lb (100.2 kg)  07/20/23 216 lb (98 kg)  07/18/23 217 lb (98.4 kg)   Body mass index is 28 kg/m.    Physical Exam Constitutional:      General: He is not in acute distress.    Appearance: Normal appearance. He is not ill-appearing.  HENT:     Head: Normocephalic and atraumatic.  Eyes:     Conjunctiva/sclera: Conjunctivae normal.  Cardiovascular:     Rate and Rhythm: Normal rate and regular rhythm.     Heart sounds: Normal heart sounds.  Pulmonary:     Effort: Pulmonary effort is normal. No respiratory distress.     Breath sounds: Normal breath sounds. No wheezing or rales.  Musculoskeletal:     Right lower leg: No edema.     Left lower leg: No edema.  Skin:    General: Skin is warm and dry.     Findings: No rash.  Neurological:     Mental Status: He is alert. Mental status is at baseline.  Psychiatric:        Mood and Affect: Mood normal.       Diabetic Foot  Exam - Simple   Simple Foot Form Diabetic Foot exam was performed with the following findings: Yes 01/19/2024  8:16 AM  Visual Inspection No deformities, no ulcerations, no other skin breakdown bilaterally: Yes See comments: Yes Sensation Testing Intact to touch and monofilament testing bilaterally: Yes Pulse Check Posterior Tibialis and Dorsalis pulse intact bilaterally: Yes Comments 4 th toenails b/l right first toenail thick and dark      Lab Results  Component Value Date   WBC 3.5 (L) 08/02/2023   HGB 13.2 08/02/2023   HCT 39.9 08/02/2023   PLT 180.0 08/02/2023   GLUCOSE 97 08/02/2023   CHOL 174 08/02/2023   TRIG 75.0 08/02/2023   HDL 49.30 08/02/2023   LDLDIRECT 108.0 05/07/2020   LDLCALC 110 (H) 08/02/2023   ALT 18 08/02/2023   AST 21 08/02/2023   NA 139 08/02/2023   K 4.3 08/02/2023   CL 104 08/02/2023   CREATININE 1.18 08/02/2023    BUN 14 08/02/2023   CO2 28 08/02/2023   TSH 3.57 01/18/2023   PSA 0.94 07/16/2022   HGBA1C 6.6 (H) 08/02/2023   MICROALBUR 3.7 (H) 08/02/2023     Assessment & Plan:    See Problem List for Assessment and Plan of chronic medical problems.

## 2024-01-19 ENCOUNTER — Encounter: Payer: Self-pay | Admitting: Internal Medicine

## 2024-01-19 ENCOUNTER — Ambulatory Visit: Payer: Self-pay | Admitting: Internal Medicine

## 2024-01-19 ENCOUNTER — Other Ambulatory Visit (HOSPITAL_COMMUNITY): Payer: Self-pay

## 2024-01-19 ENCOUNTER — Ambulatory Visit: Admitting: Internal Medicine

## 2024-01-19 VITALS — BP 134/70 | HR 58 | Temp 98.4°F | Ht 74.5 in | Wt 221.0 lb

## 2024-01-19 DIAGNOSIS — Z125 Encounter for screening for malignant neoplasm of prostate: Secondary | ICD-10-CM

## 2024-01-19 DIAGNOSIS — I4892 Unspecified atrial flutter: Secondary | ICD-10-CM | POA: Diagnosis not present

## 2024-01-19 DIAGNOSIS — I152 Hypertension secondary to endocrine disorders: Secondary | ICD-10-CM | POA: Diagnosis not present

## 2024-01-19 DIAGNOSIS — E785 Hyperlipidemia, unspecified: Secondary | ICD-10-CM

## 2024-01-19 DIAGNOSIS — K649 Unspecified hemorrhoids: Secondary | ICD-10-CM

## 2024-01-19 DIAGNOSIS — E1159 Type 2 diabetes mellitus with other circulatory complications: Secondary | ICD-10-CM | POA: Diagnosis not present

## 2024-01-19 DIAGNOSIS — I420 Dilated cardiomyopathy: Secondary | ICD-10-CM | POA: Diagnosis not present

## 2024-01-19 DIAGNOSIS — N529 Male erectile dysfunction, unspecified: Secondary | ICD-10-CM

## 2024-01-19 DIAGNOSIS — E1169 Type 2 diabetes mellitus with other specified complication: Secondary | ICD-10-CM

## 2024-01-19 LAB — CBC WITH DIFFERENTIAL/PLATELET
Basophils Absolute: 0 K/uL (ref 0.0–0.1)
Basophils Relative: 0.7 % (ref 0.0–3.0)
Eosinophils Absolute: 0.1 K/uL (ref 0.0–0.7)
Eosinophils Relative: 4.3 % (ref 0.0–5.0)
HCT: 37.6 % — ABNORMAL LOW (ref 39.0–52.0)
Hemoglobin: 12.5 g/dL — ABNORMAL LOW (ref 13.0–17.0)
Lymphocytes Relative: 42 % (ref 12.0–46.0)
Lymphs Abs: 1.3 K/uL (ref 0.7–4.0)
MCHC: 33.3 g/dL (ref 30.0–36.0)
MCV: 86.9 fl (ref 78.0–100.0)
Monocytes Absolute: 0.2 K/uL (ref 0.1–1.0)
Monocytes Relative: 8 % (ref 3.0–12.0)
Neutro Abs: 1.4 K/uL (ref 1.4–7.7)
Neutrophils Relative %: 45 % (ref 43.0–77.0)
Platelets: 161 K/uL (ref 150.0–400.0)
RBC: 4.32 Mil/uL (ref 4.22–5.81)
RDW: 14.2 % (ref 11.5–15.5)
WBC: 3.1 K/uL — ABNORMAL LOW (ref 4.0–10.5)

## 2024-01-19 LAB — COMPREHENSIVE METABOLIC PANEL WITH GFR
ALT: 15 U/L (ref 0–53)
AST: 20 U/L (ref 0–37)
Albumin: 4.3 g/dL (ref 3.5–5.2)
Alkaline Phosphatase: 41 U/L (ref 39–117)
BUN: 18 mg/dL (ref 6–23)
CO2: 29 meq/L (ref 19–32)
Calcium: 8.7 mg/dL (ref 8.4–10.5)
Chloride: 106 meq/L (ref 96–112)
Creatinine, Ser: 1.15 mg/dL (ref 0.40–1.50)
GFR: 68.74 mL/min (ref >60.00)
Glucose, Bld: 103 mg/dL — ABNORMAL HIGH (ref 70–99)
Potassium: 3.6 meq/L (ref 3.5–5.1)
Sodium: 143 meq/L (ref 135–145)
Total Bilirubin: 0.6 mg/dL (ref 0.2–1.2)
Total Protein: 6.8 g/dL (ref 6.0–8.3)

## 2024-01-19 LAB — LIPID PANEL
Cholesterol: 165 mg/dL (ref 0–200)
HDL: 52.1 mg/dL (ref >39.00)
LDL Cholesterol: 98 mg/dL (ref 0–99)
NonHDL: 113.21
Total CHOL/HDL Ratio: 3
Triglycerides: 75 mg/dL (ref 0.0–149.0)
VLDL: 15 mg/dL (ref 0.0–40.0)

## 2024-01-19 LAB — HEMOGLOBIN A1C: Hgb A1c MFr Bld: 6.4 % (ref 4.6–6.5)

## 2024-01-19 LAB — PSA, MEDICARE: PSA: 1.13 ng/mL (ref 0.10–4.00)

## 2024-01-19 MED ORDER — HYDROCORTISONE 2.5 % EX CREA
TOPICAL_CREAM | Freq: Two times a day (BID) | CUTANEOUS | 2 refills | Status: AC | PRN
  Filled 2024-01-19: qty 30, 14d supply, fill #0

## 2024-01-19 MED ORDER — SPIRONOLACTONE 25 MG PO TABS
12.5000 mg | ORAL_TABLET | Freq: Every day | ORAL | 1 refills | Status: AC
  Filled 2024-01-19: qty 90, 180d supply, fill #0
  Filled 2024-02-06: qty 45, 90d supply, fill #0

## 2024-01-19 NOTE — Assessment & Plan Note (Signed)
Chronic °Following with cardio °On entresto, spironolactone, crestor, metoprolol xl °euvolemic °

## 2024-01-19 NOTE — Assessment & Plan Note (Addendum)
 Chronic Intermittent bleeding Stressed to try to avoid constipation, frequent loose stools Start hydrocortisone  2.5% twice daily prn  Can consider banding procedure if necessary since this is a chronic issue

## 2024-01-19 NOTE — Assessment & Plan Note (Signed)
 Chronic Check lipid panel, CMP Continue crestor  10 mg daily Regular exercise and healthy diet encouraged  Lab Results  Component Value Date   LDLCALC 110 (H) 08/02/2023

## 2024-01-19 NOTE — Assessment & Plan Note (Addendum)
 Chronic Denies side effects Continue viagra  50-100 mg prn

## 2024-01-19 NOTE — Assessment & Plan Note (Addendum)
 Chronic BP borderline controlled-ideally would like his blood pressure slightly better controlled Continue to monitor BP at home Decrease sodium intake Continue metoprolol  xl 25 mg daily, entresto  97-103 mg bid Increase spironolactone  to 25 mg daily Cmp, cbc

## 2024-01-19 NOTE — Assessment & Plan Note (Signed)
 Chronic Asymptomatic Rate controlled Sees cardio On metoprolol  xl 25 mg daily and xarelto  20 mg daily Cbc, cmp

## 2024-01-19 NOTE — Assessment & Plan Note (Signed)
 Chronic Associated with hyperlipidemia, hypertension Lab Results  Component Value Date   HGBA1C 6.6 (H) 08/02/2023   Sugars controlled Check A1c Continue lifestyle control as long as sugars are controlled Stressed regular exercise, diabetic diet

## 2024-01-27 ENCOUNTER — Other Ambulatory Visit (HOSPITAL_COMMUNITY): Payer: Self-pay

## 2024-02-02 ENCOUNTER — Other Ambulatory Visit (HOSPITAL_COMMUNITY): Payer: Self-pay

## 2024-02-03 ENCOUNTER — Telehealth: Payer: Self-pay | Admitting: Cardiovascular Disease

## 2024-02-03 NOTE — Telephone Encounter (Signed)
 Paper Work Dropped Off: [facility] medical request  Date: 02/03/24  Location of paper:   Dr Delford Box   Patient need back no later then Monday

## 2024-02-06 ENCOUNTER — Other Ambulatory Visit: Payer: Self-pay

## 2024-02-06 ENCOUNTER — Other Ambulatory Visit (HOSPITAL_COMMUNITY): Payer: Self-pay

## 2024-02-07 ENCOUNTER — Telehealth: Payer: Self-pay

## 2024-02-07 ENCOUNTER — Encounter: Payer: Self-pay | Admitting: Cardiovascular Disease

## 2024-02-07 NOTE — Telephone Encounter (Signed)
 FYI: per surgeons office patient most likely will be scheduled for the extractions when the patient goes in tomorrow for a scan.      Pre-operative Risk Assessment    Patient Name: Carlos Harrison  DOB: July 17, 1960 MRN: [id number]   Date of last office visit: 05/18/23 [name], MD Date of next office visit: NONE  Request for Surgical Clearance    Procedure:  Dental Extraction - Amount of Teeth to be Pulled:  5 TEETH  Date of Surgery:  Clearance TBD                                Surgeon:  DR KARLEEN Surgeon's Group or Practice Name:  [facility] Phone number:  [phone] Fax number:  [phone]   Type of Clearance Requested:   - Medical  - Pharmacy:  Hold Rivaroxaban  (Xarelto )     Type of Anesthesia:  Local  WITH EPINEPHRINE   Additional requests/questions:    SignedLucie DELENA Ku   02/07/2024, 3:19 PM

## 2024-02-08 NOTE — Telephone Encounter (Signed)
   Name: CIRE CLUTE  DOB: [birth year]  MRN: [id number]  Primary Cardiologist: [name], MD  Both pharm and medical clearance requested.  Will also route to pharm for input on holding anticoagulation then will need VV given medical clearance requested, last OV 05/2023.  Per chart review, SBE prophylaxis is not required for the patient from a cardiac standpoint.   [name], [name] 02/08/2024, 9:55 AM [facility]

## 2024-02-09 NOTE — Telephone Encounter (Signed)
 Update patient informed the front office that the date of the procedure date is set for 02/17/24 at 11 am.  Patient is requesting a call or MyChart message to inform him of when the preop clearance is complete/sent to dentist office.  Will route to Preop App for review

## 2024-02-13 NOTE — Telephone Encounter (Signed)
 Patient with diagnosis of paroxysmal atrial flutter on rivaroxaban  for anticoagulation.    Procedure: dental extraction (x5 teeth) Date of procedure: TBD   CHA2DS2-VASc Score = 3   This indicates a 3.2% annual risk of stroke. The patient's score is based upon: CHF History: 1 HTN History: 1 Diabetes History: 1 Stroke History: 0 Vascular Disease History: 0 Age Score: 0 Gender Score: 0      CrCl 95.6 mL/min (Scr 1.15 01/22/2024) Platelet count 161K on 01/22/2024  Patient has not had an Afib/aflutter ablation in the last 3 months, DCCV within the last 4 weeks or a watchman implanted in the last 45 days   Patient does not require pre-op antibiotics for dental procedure.  Per office protocol, patient can hold rivaroxaban  for 1 day prior to procedure.    **This guidance is not considered finalized until pre-operative APP has relayed final recommendations.**  Nidia Schaffer, [name] PGY2 Cardiology Pharmacy Resident

## 2024-02-14 ENCOUNTER — Telehealth (HOSPITAL_BASED_OUTPATIENT_CLINIC_OR_DEPARTMENT_OTHER): Payer: Self-pay | Admitting: *Deleted

## 2024-02-14 NOTE — Telephone Encounter (Signed)
 Left message for pt to call back ASAP in the morning to schedule a tele preop appt.      Carlos Harrison HERO, NP to Me  (Selected Message)     02/14/24  4:58 PM Preoperative team,   Please contact patient and set up phone visit.  Pharmacy has addressed anticoagulation hold.   Thank you for your help.   Carlos Harrison. Cleaver NP-C      02/14/2024, 4:57 PM [facility] [address] [place], [place] [address] Office [phone]    02/14/24  4:49 PM Carlos Andrez PARAS, LPN routed this conversation to [facility] Preop Carlos Harrison to P [facility] Triage (supporting Carlos JAYSON Emmer, MD)     02/14/24  9:37 AM Good morning with all due  respect, it has been more than 48 hours from last message need my dental form filled out, still have not heard back. It's been more than a week since I left the form at the office and more than 48 hours when I was informed the pharmacist needed to review my medical chart, I'm having dental work done this Friday 02-17-24, I informed the receptionist on last Thursday. All I'm asking is the form completed and can I pick it up Wednesday 02-15-24, to give to my dentist. Thank you Carlos Harrison, [facility]    02/13/24  2:00 PM Note Patient with diagnosis of paroxysmal atrial flutter on rivaroxaban  for anticoagulation.     Procedure: dental extraction (x5 teeth) Date of procedure: TBD     CHA2DS2-VASc Score = 3   This indicates a 3.2% annual risk of stroke. The patient's score is based upon: CHF History: 1 HTN History: 1 Diabetes History: 1 Stroke History: 0 Vascular Disease History: 0 Age Score: 0 Gender Score: 0       CrCl 95.6 mL/min (Scr 1.15 01/22/2024) Platelet count 161K on 01/22/2024   Patient has not had an Afib/aflutter ablation in the last 3 months, DCCV within the last 4 weeks or a watchman implanted in the last 45 days    Patient does not require pre-op antibiotics for dental procedure.   Per office  protocol, patient can hold rivaroxaban  for 1 day prior to procedure.     **This guidance is not considered finalized until pre-operative APP has relayed final recommendations.[name], [name] PGY2 Cardiology Pharmacy Resident          02/09/24 12:03 PM Carlos Harrison, CMA routed this conversation to Cv [facility] Preop  Carlos Harrison, [name]    02/09/24 12:03 PM Note Update patient informed the front office that the date of the procedure date is set for 02/17/24 at 11 am.   Patient is requesting a call or MyChart message to inform him of when the preop clearance is complete/sent to dentist office.   Will route to Preop App for review       02/08/24  9:56 AM Harrison, Carlos N, [name] routed this conversation to Cv Div Preop Pharm  Harrison, Carlos N, [name]    02/08/24  9:56 AM Note    Name: Carlos Harrison  DOB: 04/02/1960  MRN: [id number]   Primary Cardiologist: Carlos Emmer, MD   Both pharm and medical clearance requested.   Will also route to pharm for input on holding anticoagulation then will need VV given medical clearance requested, last OV 05/2023.   Per chart review, SBE prophylaxis is not required for the patient from a  cardiac standpoint.     Carlos LOISE Bring, [name] 02/08/2024, 9:55 AM [facility]               02/07/24  3:27 PM Carlos Harrison, CMA routed this conversation to Cv Div Preop  Carlos Harrison, CMA    02/07/24  3:23 PM Note FYI: per surgeons office patient most likely will be scheduled for the extractions when the patient goes in tomorrow for a scan.         Pre-operative Risk Assessment    Patient Name: Carlos Harrison  DOB: [birth year] MRN: [id number]   Date of last office visit: 05/18/23 Carlos EMMER, MD Date of next office visit: NONE   Request for Surgical Clearance    Procedure:  Dental Extraction - Amount of Teeth to be Pulled:  5 TEETH   Date of Surgery:  Clearance TBD                                   Surgeon:  DR KARLEEN Surgeon's Group or Practice Name:  [facility] Phone number:  [phone] Fax number:  [phone]   Type of Clearance Requested:   - Medical  - Pharmacy:  Hold Rivaroxaban  (Xarelto )     Type of Anesthesia:  Local  WITH EPINEPHRINE   Additional requests/questions:     Signed, Carlos Harrison   02/07/2024, 3:19 PM        Carlos Sober, NP to Cv [facility] Preop Callback     02/07/24  2:56 PM Please see messages below. I do not see a request.    Thank you!   DW Carlos Buel BROCKS, RN to [facility] Preop     02/07/24  2:37 PM Is there anything we can do regarding this? See Telephone encounter from 02/03/2024 (request is from [facility]). Thank you. Pt last saw Dr. Nishan 04/2023   02/07/24  9:51 AM Carlos Roxie LITTIE, RN routed this conversation to [facility] Triage  Carlos Sober, NP to Carlos Roxie LITTIE, RN     02/07/24  9:21 AM It looks like patient dropped off paper work that went to Dr. Claiborne box on 11/7. There are no requests in the system.    Thanks!   DW Carlos Roxie LITTIE, RN to [facility] Preop     02/07/24  9:09 AM Hi Preop Team,   Did you get a dental form for blood thinner holds on this patient? I checked with the nurse covering Dr. Emmer today and it was not in his mailbox. Carlos Harrison to [facility] Triage (supporting Carlos Harrison Emmer, MD)     02/07/24  7:44 AM Good morning Dr. Emmer, I left a dentist form on this past Thursday, I need signed for dental work to be done starting tomorrow ( 02-08-2024). About the Xarelto . I informed the receptionist that I needed it back by today at the latest. Just writing to see if you have looked it over and signed yet. I can stop by and pick it up before I go to work (1pm). Thanks Carlos Harrison.

## 2024-02-15 ENCOUNTER — Ambulatory Visit: Attending: Physician Assistant | Admitting: Physician Assistant

## 2024-02-15 ENCOUNTER — Telehealth (HOSPITAL_BASED_OUTPATIENT_CLINIC_OR_DEPARTMENT_OTHER): Payer: Self-pay | Admitting: *Deleted

## 2024-02-15 DIAGNOSIS — Z01810 Encounter for preprocedural cardiovascular examination: Secondary | ICD-10-CM | POA: Diagnosis not present

## 2024-02-15 NOTE — Progress Notes (Signed)
 Virtual Visit via Telephone Note   Because of [name] co-morbid illnesses, he is at least at moderate risk for complications without adequate follow up.  This format is felt to be most appropriate for this patient at this time.  Due to technical limitations with video connection (technology), today's appointment will be conducted as an audio only telehealth visit, and [name] verbally agreed to proceed in this manner.   All issues noted in this document were discussed and addressed.  No physical exam could be performed with this format.  Evaluation Performed:  Preoperative cardiovascular risk assessment _____________   Date:  02/15/2024   Patient ID:  Carlos Harrison, DOB [birth year], MRN [id number] Patient Location:  Home Provider location:   Office  Primary Care Provider:  Geofm Glade PARAS, MD Primary Cardiologist:  [name], MD  Chief Complaint / Patient Profile   63 y.o. y/o male with a h/o HTN, HLD, PAF and chronic systolic CHF. 11/15/17 admitted with rapid afib/flutter. TEE/DCC 11/16/17 EF during arrhythmia 15-20%Improved since DCC.  Anticoagulation with normal dose xarelto  who is pending 5 teeth extracted and presents today for telephonic preoperative cardiovascular risk assessment.  History of Present Illness    Carlos Harrison is a 63 y.o. male who presents via audio/video conferencing for a telehealth visit today.  Pt was last seen in cardiology clinic on 05/18/2023 by Dr. Emmer.  At that time [name] was doing well.  The patient is now pending procedure as outlined above. Since his last visit, he feels pretty good from a heart standpoint.  No chest pains or SOB outside his baseline. He works at [employer] and he always takes the steps going up and down. He does meet min mets.   Per office protocol, patient can hold rivaroxaban  for 1 day prior to procedure.  Please resume when medically safe to do so.  Past Medical History    Past Medical History:  Diagnosis  Date   Atrial fibrillation (HCC) 10/2017   Cardiomyopathy (HCC) 11/10/2017   Dyspnea    Hyperlipidemia    Hypertension    Palpitations 10/26/2017   Past Surgical History:  Procedure Laterality Date   CARDIOVERSION N/A 11/16/2017   Procedure: CARDIOVERSION;  Surgeon: Nahser, Aleene PARAS, MD;  Location: [facility] ENDOSCOPY;  Service: Cardiovascular;  Laterality: N/A;   EYE SURGERY Left    age 85 or 13   TEE WITH CARDIOVERSION  11/16/2017   TEE WITHOUT CARDIOVERSION N/A 11/16/2017   Procedure: TRANSESOPHAGEAL ECHOCARDIOGRAM (TEE);  Surgeon: Alveta Aleene PARAS, MD;  Location: [facility] ENDOSCOPY;  Service: Cardiovascular;  Laterality: N/A;    Allergies  No Known Allergies  Home Medications    Prior to Admission medications   Medication Sig Start Date End Date Taking? Authorizing Provider  hydrocortisone  2.5 % cream Apply topically 2 (two) times daily as needed. Use for up to 14 days at a time 01/19/24   Geofm Glade PARAS, MD  ibuprofen  (ADVIL ) 600 MG tablet Take 1 tablet (600 mg total) by mouth every 6 (six) hours as needed. 03/19/23   Odell Balls, [name]  methocarbamol  (ROBAXIN ) 750 MG tablet Take 1 tablet (750 mg total) by mouth 2 (two) times daily. 03/19/23   Odell Balls, [name]  metoprolol  succinate (TOPROL  XL) 25 MG 24 hr tablet Take 1 tablet (25 mg total) by mouth daily. 06/21/23   Nishan, Peter C, MD  rivaroxaban  (XARELTO ) 20 MG TABS tablet Take 1 tablet (20 mg total) by mouth daily with supper.  08/02/23   Delford Maude BROCKS, MD  rosuvastatin  (CRESTOR ) 10 MG tablet Take 1 tablet (10 mg total) by mouth daily. 12/26/23   Nishan, Peter C, MD  sacubitril -valsartan  (ENTRESTO ) 97-103 MG Take 1 tablet by mouth 2 (two) times daily. 12/02/23   Nishan, Peter C, MD  sildenafil  (VIAGRA ) 100 MG tablet Take 1/2-1 tablet (50-100 mg total) by mouth daily as needed for erectile dysfunction. 07/20/23   Geofm Glade PARAS, MD  spironolactone  (ALDACTONE ) 25 MG tablet Take 1/2 tablet (12.5 mg total) by mouth daily. 01/19/24   Geofm Glade PARAS, MD    Physical Exam    Vital Signs:  Carlos Harrison does not have vital signs available for review today.  Given telephonic nature of communication, physical exam is limited. AAOx3. NAD. Normal affect.  Speech and respirations are unlabored.  Accessory Clinical Findings    None  Assessment & Plan    1.  Preoperative Cardiovascular Risk Assessment:   [name] perioperative risk of a major cardiac event is 0.9% according to the Revised Cardiac Risk Index (RCRI).  Therefore, he is at low risk for perioperative complications.   His functional capacity is good at 5.07 METs according to the Duke Activity Status Index (DASI). Recommendations: According to ACC/AHA guidelines, no further cardiovascular testing needed.  The patient may proceed to surgery at acceptable risk.   Antiplatelet and/or Anticoagulation Recommendations:  Xarelto  (Rivaroxaban ) can be held for 1 days prior to surgery.  Please resume post op when felt to be safe.     The patient was advised that if he develops new symptoms prior to surgery to contact our office to arrange for a follow-up visit, and he verbalized understanding.   A copy of this note will be routed to requesting surgeon.  Time:   Today, I have spent 8 minutes with the patient with telehealth technology discussing medical history, symptoms, and management plan.     Orren LOISE Fabry, [name]  02/15/2024, 11:45 AM

## 2024-02-15 NOTE — Telephone Encounter (Signed)
 Pt calling back

## 2024-02-15 NOTE — Telephone Encounter (Signed)
 Pt has been added on today at 11:40 ok per Tessa C. PAC due to pt works 2nd shift and cannot do tele at 3:40. Med rec and consent are done.      Patient Consent for Virtual Visit        Carlos Harrison has provided verbal consent on 02/15/2024 for a virtual visit (video or telephone).   CONSENT FOR VIRTUAL VISIT FOR:  Carlos Harrison  By participating in this virtual visit I agree to the following:  I hereby voluntarily request, consent and authorize [facility] and its employed or contracted physicians, physician assistants, nurse practitioners or other licensed health care professionals (the Practitioner), to provide me with telemedicine health care services (the "Services) as deemed necessary by the treating Practitioner. I acknowledge and consent to receive the Services by the Practitioner via telemedicine. I understand that the telemedicine visit will involve communicating with the Practitioner through live audiovisual communication technology and the disclosure of certain medical information by electronic transmission. I acknowledge that I have been given the opportunity to request an in-person assessment or other available alternative prior to the telemedicine visit and am voluntarily participating in the telemedicine visit.  I understand that I have the right to withhold or withdraw my consent to the use of telemedicine in the course of my care at any time, without affecting my right to future care or treatment, and that the Practitioner or I may terminate the telemedicine visit at any time. I understand that I have the right to inspect all information obtained and/or recorded in the course of the telemedicine visit and may receive copies of available information for a reasonable fee.  I understand that some of the potential risks of receiving the Services via telemedicine include:  Delay or interruption in medical evaluation due to technological equipment failure or  disruption; Information transmitted may not be sufficient (e.g. poor resolution of images) to allow for appropriate medical decision making by the Practitioner; and/or  In rare instances, security protocols could fail, causing a breach of personal health information.  Furthermore, I acknowledge that it is my responsibility to provide information about my medical history, conditions and care that is complete and accurate to the best of my ability. I acknowledge that Practitioner's advice, recommendations, and/or decision may be based on factors not within their control, such as incomplete or inaccurate data provided by me or distortions of diagnostic images or specimens that may result from electronic transmissions. I understand that the practice of medicine is not an exact science and that Practitioner makes no warranties or guarantees regarding treatment outcomes. I acknowledge that a copy of this consent can be made available to me via my patient portal [facility] MyChart), or I can request a printed copy by calling the office of [facility].    I understand that my insurance will be billed for this visit.   I have read or had this consent read to me. I understand the contents of this consent, which adequately explains the benefits and risks of the Services being provided via telemedicine.  I have been provided ample opportunity to ask questions regarding this consent and the Services and have had my questions answered to my satisfaction. I give my informed consent for the services to be provided through the use of telemedicine in my medical care

## 2024-02-15 NOTE — Telephone Encounter (Signed)
 Pt has been added on today at 11:40 ok per Tessa C. PAC due to pt works 2nd shift and cannot do tele at 3:40. Med rec and consent are done.

## 2024-02-17 ENCOUNTER — Other Ambulatory Visit (HOSPITAL_COMMUNITY): Payer: Self-pay

## 2024-02-17 MED ORDER — CLINDAMYCIN HCL 300 MG PO CAPS
300.0000 mg | ORAL_CAPSULE | Freq: Three times a day (TID) | ORAL | 0 refills | Status: AC
  Filled 2024-02-17: qty 21, 7d supply, fill #0

## 2024-04-05 ENCOUNTER — Other Ambulatory Visit (HOSPITAL_COMMUNITY): Payer: Self-pay

## 2024-04-25 ENCOUNTER — Other Ambulatory Visit: Payer: Self-pay | Admitting: Cardiovascular Disease

## 2024-04-26 ENCOUNTER — Other Ambulatory Visit (HOSPITAL_COMMUNITY): Payer: Self-pay

## 2024-04-26 MED ORDER — RIVAROXABAN 20 MG PO TABS
20.0000 mg | ORAL_TABLET | Freq: Every day | ORAL | 2 refills | Status: AC
  Filled 2024-04-26: qty 90, 90d supply, fill #0

## 2024-05-04 NOTE — Progress Notes (Unsigned)
 "    Evaluation Performed:  Follow-up visit  Date:  05/04/2024   ID:  Carlos Harrison, DOB 20-Mar-1961, MRN [id number]   PCP:  Geofm Glade PARAS, MD  Cardiologist:  [name], MD   Electrophysiologist:  None   Chief Complaint:  PAF/CHF  History of Present Illness:    64 y.o. HTN, HLD, PAF and chronic systolic CHF. 11/15/17 admitted with rapid afib/flutter. TEE/DCC 11/16/17 EF during arrhythmia 15-20%Improved since DCC.  Anticoagulation with normal dose xarelto .    Echo done 02/15/18 reviewed EF improved to 45-50%  Cardiac CTA 09/19/18 calcium  score 0 normal right dominant coronary arteries   He is working at [employer] now  [place] Applying for a job in pharmacy at [employer]  Tends toward bradycardia and his Toprol  was decreased to 25 mg and entresto  increased to 97/103 mg on 05/26/21  TTE 11/22/22  EF 55-60% mild LVH and no significant valve dx   No cardiac issues  Compliant with meds   ***   Past Medical History:  Diagnosis Date   Atrial fibrillation (HCC) 10/2017   Cardiomyopathy (HCC) 11/10/2017   Dyspnea    Hyperlipidemia    Hypertension    Palpitations 10/26/2017   Past Surgical History:  Procedure Laterality Date   CARDIOVERSION N/A 11/16/2017   Procedure: CARDIOVERSION;  Surgeon: Nahser, Aleene PARAS, MD;  Location: [facility] ENDOSCOPY;  Service: Cardiovascular;  Laterality: N/A;   EYE SURGERY Left    age 53 or 41   TEE WITH CARDIOVERSION  11/16/2017   TEE WITHOUT CARDIOVERSION N/A 11/16/2017   Procedure: TRANSESOPHAGEAL ECHOCARDIOGRAM (TEE);  Surgeon: Alveta Aleene PARAS, MD;  Location[facility] ENDOSCOPY;  Service: Cardiovascular;  Laterality: N/A;     No outpatient medications have been marked as taking for the 05/18/24 encounter (Appointment) with [name] C, MD.     Allergies:   Patient has no known allergies.   Social History   Tobacco Use   Smoking status: Never   Smokeless tobacco: Never  Vaping Use    Vaping status: Never Used  Substance Use Topics   Alcohol use: No   Drug use: No     Family Hx: The patient's family history includes Alcohol abuse in his brother; Cirrhosis in his brother and brother; Dementia in his brother and brother; Diabetes in his brother; Heart disease in his brother, brother, and brother; Hypertension in his son; Ovarian cancer in his mother; Premature birth in his daughter; Seizures in his daughter.  ROS:   Please see the history of present illness.     All other systems reviewed and are negative.   Prior CV studies:   The following studies were reviewed today:  Echo  11/22/22  IMPRESSIONS     1. Left ventricular ejection fraction, by estimation, is 55 to 60%. Left  ventricular ejection fraction by 3D volume is 58 %. The left ventricle has  normal function. The left ventricle has no regional wall motion  abnormalities. There is mild left  ventricular hypertrophy. Left ventricular diastolic parameters were  normal. The average left ventricular global longitudinal strain is -15.0  %. The global longitudinal strain is abnormal.   2. RV free wall strain normal at -26.1%, RVEF 40% (normal). Right  ventricular systolic function is normal. The right ventricular size is  mildly enlarged.   3. The mitral valve is abnormal. Trivial mitral valve regurgitation.   4. The aortic valve is tricuspid. Aortic valve regurgitation is not  visualized.   Comparison(s): Changes from prior study are noted. 04/23/2020: LVEF 55-60%.    Labs/Other Tests and Data Reviewed:    EKG: SR rate 54 normal 01/23/18  05/04/2024 SR rate 63 normal 05/04/2024 NSR rate 53 normal   Recent Labs: 01/19/2024: ALT 15; BUN 18; Creatinine, Ser 1.15; Hemoglobin 12.5; Platelets 161.0; Potassium 3.6; Sodium 143   Recent Lipid Panel Lab Results  Component Value Date/Time   CHOL 165 01/19/2024 08:31 AM   TRIG 75.0 01/19/2024 08:31 AM   HDL 52.10 01/19/2024 08:31 AM   CHOLHDL 3 01/19/2024 08:31 AM    LDLCALC 98 01/19/2024 08:31 AM   LDLDIRECT 108.0 05/07/2020 11:32 AM    Wt Readings from Last 3 Encounters:  01/19/24 221 lb (100.2 kg)  07/20/23 216 lb (98 kg)  07/18/23 217 lb (98.4 kg)     Objective:    Vital Signs:  There were no vitals taken for this visit.   Affect appropriate Healthy:  appears stated age HEENT: normal Neck supple with no adenopathy JVP normal no bruits no thyromegaly Lungs clear with no wheezing and good diaphragmatic motion Heart:  S1/S2 no murmur, no rub, gallop or click PMI normal Abdomen: benighn, BS positve, no tenderness, no AAA no bruit.  No HSM or HJR Distal pulses intact with no bruits No edema Neuro non-focal Skin warm and dry No muscular weakness    ASSESSMENT & PLAN:    PAF: maintaining NSR continue lower dose Toprol  and anticoagulation ? Short run PAF August 2024  If continues can consider 30 day monitor In NSR today  DCM:  EF improved on goal directed Rx including enteresto EF 55-60% % by TTE 11/22/22 Cardiac CTA June 2020 with calcium  score 0 and normal right dominant coronary arteries    HLD:  On statin labs with primary LDL 80 05/13/21 ok given calcium  score 0 HTN:  will decrease sodium in diet. Monitor at home May need to increase aldactone  to full tablet daily or add norvasc     Medication Adjustments/Labs and Tests Ordered:  None  F/U in a year   Signed, [name], MD  05/04/2024 5:30 PM    [facility] "

## 2024-05-18 ENCOUNTER — Ambulatory Visit: Admitting: Cardiovascular Disease

## 2024-07-20 ENCOUNTER — Encounter: Admitting: Internal Medicine
